# Patient Record
Sex: Male | Born: 1945
Health system: Southern US, Community
[De-identification: ages and names within clinical notes are randomized; demographics above are authoritative.]

## PROBLEM LIST (undated history)

## (undated) DIAGNOSIS — N289 Disorder of kidney and ureter, unspecified: Secondary | ICD-10-CM

## (undated) DIAGNOSIS — I10 Essential (primary) hypertension: Secondary | ICD-10-CM

## (undated) DIAGNOSIS — J449 Chronic obstructive pulmonary disease, unspecified: Secondary | ICD-10-CM

## (undated) DIAGNOSIS — I4891 Unspecified atrial fibrillation: Secondary | ICD-10-CM

## (undated) DIAGNOSIS — F419 Anxiety disorder, unspecified: Secondary | ICD-10-CM

## (undated) DIAGNOSIS — E119 Type 2 diabetes mellitus without complications: Secondary | ICD-10-CM

## (undated) DIAGNOSIS — I509 Heart failure, unspecified: Secondary | ICD-10-CM

## (undated) DIAGNOSIS — N189 Chronic kidney disease, unspecified: Secondary | ICD-10-CM

## (undated) HISTORY — DX: Type 2 diabetes mellitus without complications: E11.9

## (undated) HISTORY — DX: Chronic kidney disease, unspecified: N18.9

## (undated) HISTORY — DX: Unspecified atrial fibrillation: I48.91

## (undated) HISTORY — DX: Anxiety disorder, unspecified: F41.9

## (undated) HISTORY — DX: Disorder of kidney and ureter, unspecified: N28.9

---

## 2015-10-22 DIAGNOSIS — J440 Chronic obstructive pulmonary disease with acute lower respiratory infection: Secondary | ICD-10-CM | POA: Diagnosis not present

## 2015-10-22 DIAGNOSIS — Z0000 Encounter for general adult medical examination without abnormal findings: Secondary | ICD-10-CM | POA: Diagnosis not present

## 2015-10-22 DIAGNOSIS — J0101 Acute recurrent maxillary sinusitis: Secondary | ICD-10-CM | POA: Diagnosis not present

## 2015-10-22 DIAGNOSIS — Z1389 Encounter for screening for other disorder: Secondary | ICD-10-CM | POA: Diagnosis not present

## 2015-10-22 DIAGNOSIS — Z7901 Long term (current) use of anticoagulants: Secondary | ICD-10-CM | POA: Diagnosis not present

## 2015-10-29 DIAGNOSIS — K8020 Calculus of gallbladder without cholecystitis without obstruction: Secondary | ICD-10-CM | POA: Diagnosis not present

## 2015-11-10 DIAGNOSIS — I5030 Unspecified diastolic (congestive) heart failure: Secondary | ICD-10-CM | POA: Diagnosis not present

## 2015-11-10 DIAGNOSIS — J9610 Chronic respiratory failure, unspecified whether with hypoxia or hypercapnia: Secondary | ICD-10-CM | POA: Diagnosis not present

## 2015-11-10 DIAGNOSIS — Z7901 Long term (current) use of anticoagulants: Secondary | ICD-10-CM | POA: Diagnosis not present

## 2015-11-10 DIAGNOSIS — K8020 Calculus of gallbladder without cholecystitis without obstruction: Secondary | ICD-10-CM | POA: Diagnosis not present

## 2015-11-10 DIAGNOSIS — R1012 Left upper quadrant pain: Secondary | ICD-10-CM | POA: Diagnosis not present

## 2015-11-18 DIAGNOSIS — Z8249 Family history of ischemic heart disease and other diseases of the circulatory system: Secondary | ICD-10-CM | POA: Diagnosis not present

## 2015-11-18 DIAGNOSIS — I129 Hypertensive chronic kidney disease with stage 1 through stage 4 chronic kidney disease, or unspecified chronic kidney disease: Secondary | ICD-10-CM | POA: Diagnosis not present

## 2015-11-18 DIAGNOSIS — R1012 Left upper quadrant pain: Secondary | ICD-10-CM | POA: Diagnosis not present

## 2015-11-18 DIAGNOSIS — I509 Heart failure, unspecified: Secondary | ICD-10-CM | POA: Diagnosis not present

## 2015-11-18 DIAGNOSIS — J449 Chronic obstructive pulmonary disease, unspecified: Secondary | ICD-10-CM | POA: Diagnosis not present

## 2015-11-18 DIAGNOSIS — Z79899 Other long term (current) drug therapy: Secondary | ICD-10-CM | POA: Diagnosis not present

## 2015-11-18 DIAGNOSIS — I4891 Unspecified atrial fibrillation: Secondary | ICD-10-CM | POA: Diagnosis not present

## 2015-11-18 DIAGNOSIS — E1122 Type 2 diabetes mellitus with diabetic chronic kidney disease: Secondary | ICD-10-CM | POA: Diagnosis not present

## 2015-11-18 DIAGNOSIS — N183 Chronic kidney disease, stage 3 (moderate): Secondary | ICD-10-CM | POA: Diagnosis not present

## 2015-11-18 DIAGNOSIS — Z7901 Long term (current) use of anticoagulants: Secondary | ICD-10-CM | POA: Diagnosis not present

## 2015-11-18 DIAGNOSIS — Z833 Family history of diabetes mellitus: Secondary | ICD-10-CM | POA: Diagnosis not present

## 2015-11-18 DIAGNOSIS — R1011 Right upper quadrant pain: Secondary | ICD-10-CM | POA: Diagnosis not present

## 2015-11-18 DIAGNOSIS — I110 Hypertensive heart disease with heart failure: Secondary | ICD-10-CM | POA: Diagnosis not present

## 2015-11-18 DIAGNOSIS — K449 Diaphragmatic hernia without obstruction or gangrene: Secondary | ICD-10-CM | POA: Diagnosis not present

## 2015-11-18 DIAGNOSIS — K8020 Calculus of gallbladder without cholecystitis without obstruction: Secondary | ICD-10-CM | POA: Diagnosis not present

## 2015-11-25 DIAGNOSIS — J449 Chronic obstructive pulmonary disease, unspecified: Secondary | ICD-10-CM | POA: Diagnosis not present

## 2015-11-25 DIAGNOSIS — M1990 Unspecified osteoarthritis, unspecified site: Secondary | ICD-10-CM | POA: Diagnosis not present

## 2015-11-25 DIAGNOSIS — Z7984 Long term (current) use of oral hypoglycemic drugs: Secondary | ICD-10-CM | POA: Diagnosis not present

## 2015-11-25 DIAGNOSIS — Z79899 Other long term (current) drug therapy: Secondary | ICD-10-CM | POA: Diagnosis not present

## 2015-11-25 DIAGNOSIS — E119 Type 2 diabetes mellitus without complications: Secondary | ICD-10-CM | POA: Diagnosis not present

## 2015-11-25 DIAGNOSIS — Z7951 Long term (current) use of inhaled steroids: Secondary | ICD-10-CM | POA: Diagnosis not present

## 2015-11-25 DIAGNOSIS — Z792 Long term (current) use of antibiotics: Secondary | ICD-10-CM | POA: Diagnosis not present

## 2015-11-25 DIAGNOSIS — M8589 Other specified disorders of bone density and structure, multiple sites: Secondary | ICD-10-CM | POA: Diagnosis not present

## 2015-11-25 DIAGNOSIS — I10 Essential (primary) hypertension: Secondary | ICD-10-CM | POA: Diagnosis not present

## 2015-11-25 DIAGNOSIS — Z87891 Personal history of nicotine dependence: Secondary | ICD-10-CM | POA: Diagnosis not present

## 2015-11-25 DIAGNOSIS — M810 Age-related osteoporosis without current pathological fracture: Secondary | ICD-10-CM | POA: Diagnosis not present

## 2015-12-01 DIAGNOSIS — Z7901 Long term (current) use of anticoagulants: Secondary | ICD-10-CM | POA: Diagnosis not present

## 2015-12-11 DIAGNOSIS — J9610 Chronic respiratory failure, unspecified whether with hypoxia or hypercapnia: Secondary | ICD-10-CM | POA: Diagnosis not present

## 2015-12-11 DIAGNOSIS — I5030 Unspecified diastolic (congestive) heart failure: Secondary | ICD-10-CM | POA: Diagnosis not present

## 2015-12-16 DIAGNOSIS — I340 Nonrheumatic mitral (valve) insufficiency: Secondary | ICD-10-CM | POA: Diagnosis not present

## 2015-12-16 DIAGNOSIS — I5020 Unspecified systolic (congestive) heart failure: Secondary | ICD-10-CM | POA: Diagnosis not present

## 2015-12-16 DIAGNOSIS — R0602 Shortness of breath: Secondary | ICD-10-CM | POA: Diagnosis not present

## 2015-12-16 DIAGNOSIS — J449 Chronic obstructive pulmonary disease, unspecified: Secondary | ICD-10-CM | POA: Diagnosis not present

## 2015-12-16 DIAGNOSIS — I272 Other secondary pulmonary hypertension: Secondary | ICD-10-CM | POA: Diagnosis not present

## 2015-12-20 DIAGNOSIS — K8020 Calculus of gallbladder without cholecystitis without obstruction: Secondary | ICD-10-CM | POA: Diagnosis not present

## 2015-12-20 DIAGNOSIS — R1012 Left upper quadrant pain: Secondary | ICD-10-CM | POA: Diagnosis not present

## 2015-12-20 DIAGNOSIS — R109 Unspecified abdominal pain: Secondary | ICD-10-CM | POA: Diagnosis not present

## 2015-12-24 DIAGNOSIS — J439 Emphysema, unspecified: Secondary | ICD-10-CM | POA: Diagnosis not present

## 2015-12-24 DIAGNOSIS — N3289 Other specified disorders of bladder: Secondary | ICD-10-CM | POA: Diagnosis not present

## 2015-12-24 DIAGNOSIS — I714 Abdominal aortic aneurysm, without rupture: Secondary | ICD-10-CM | POA: Diagnosis not present

## 2015-12-24 DIAGNOSIS — K8020 Calculus of gallbladder without cholecystitis without obstruction: Secondary | ICD-10-CM | POA: Diagnosis not present

## 2015-12-24 DIAGNOSIS — R109 Unspecified abdominal pain: Secondary | ICD-10-CM | POA: Diagnosis not present

## 2015-12-29 DIAGNOSIS — Z7901 Long term (current) use of anticoagulants: Secondary | ICD-10-CM | POA: Diagnosis not present

## 2016-01-03 DIAGNOSIS — R1012 Left upper quadrant pain: Secondary | ICD-10-CM | POA: Diagnosis not present

## 2016-01-03 DIAGNOSIS — K8020 Calculus of gallbladder without cholecystitis without obstruction: Secondary | ICD-10-CM | POA: Diagnosis not present

## 2016-01-08 DIAGNOSIS — I5030 Unspecified diastolic (congestive) heart failure: Secondary | ICD-10-CM | POA: Diagnosis not present

## 2016-01-08 DIAGNOSIS — J9610 Chronic respiratory failure, unspecified whether with hypoxia or hypercapnia: Secondary | ICD-10-CM | POA: Diagnosis not present

## 2016-01-18 DIAGNOSIS — Z7901 Long term (current) use of anticoagulants: Secondary | ICD-10-CM | POA: Diagnosis not present

## 2016-01-19 DIAGNOSIS — Z79899 Other long term (current) drug therapy: Secondary | ICD-10-CM | POA: Diagnosis not present

## 2016-01-19 DIAGNOSIS — J449 Chronic obstructive pulmonary disease, unspecified: Secondary | ICD-10-CM | POA: Diagnosis not present

## 2016-01-19 DIAGNOSIS — Z5181 Encounter for therapeutic drug level monitoring: Secondary | ICD-10-CM | POA: Diagnosis not present

## 2016-01-20 DIAGNOSIS — Z0000 Encounter for general adult medical examination without abnormal findings: Secondary | ICD-10-CM | POA: Diagnosis not present

## 2016-01-20 DIAGNOSIS — J440 Chronic obstructive pulmonary disease with acute lower respiratory infection: Secondary | ICD-10-CM | POA: Diagnosis not present

## 2016-01-20 DIAGNOSIS — I480 Paroxysmal atrial fibrillation: Secondary | ICD-10-CM | POA: Diagnosis not present

## 2016-01-20 DIAGNOSIS — I5022 Chronic systolic (congestive) heart failure: Secondary | ICD-10-CM | POA: Diagnosis not present

## 2016-02-08 DIAGNOSIS — I5030 Unspecified diastolic (congestive) heart failure: Secondary | ICD-10-CM | POA: Diagnosis not present

## 2016-02-08 DIAGNOSIS — J9610 Chronic respiratory failure, unspecified whether with hypoxia or hypercapnia: Secondary | ICD-10-CM | POA: Diagnosis not present

## 2016-02-16 DIAGNOSIS — Z7901 Long term (current) use of anticoagulants: Secondary | ICD-10-CM | POA: Diagnosis not present

## 2016-02-17 DIAGNOSIS — J988 Other specified respiratory disorders: Secondary | ICD-10-CM | POA: Diagnosis not present

## 2016-02-17 DIAGNOSIS — Z79899 Other long term (current) drug therapy: Secondary | ICD-10-CM | POA: Diagnosis not present

## 2016-03-09 DIAGNOSIS — J9610 Chronic respiratory failure, unspecified whether with hypoxia or hypercapnia: Secondary | ICD-10-CM | POA: Diagnosis not present

## 2016-03-09 DIAGNOSIS — I5030 Unspecified diastolic (congestive) heart failure: Secondary | ICD-10-CM | POA: Diagnosis not present

## 2016-03-15 DIAGNOSIS — I480 Paroxysmal atrial fibrillation: Secondary | ICD-10-CM | POA: Diagnosis not present

## 2016-04-05 DIAGNOSIS — I480 Paroxysmal atrial fibrillation: Secondary | ICD-10-CM | POA: Diagnosis not present

## 2016-04-09 DIAGNOSIS — I5030 Unspecified diastolic (congestive) heart failure: Secondary | ICD-10-CM | POA: Diagnosis not present

## 2016-04-09 DIAGNOSIS — J9610 Chronic respiratory failure, unspecified whether with hypoxia or hypercapnia: Secondary | ICD-10-CM | POA: Diagnosis not present

## 2016-04-24 DIAGNOSIS — E1122 Type 2 diabetes mellitus with diabetic chronic kidney disease: Secondary | ICD-10-CM | POA: Diagnosis not present

## 2016-04-24 DIAGNOSIS — I5022 Chronic systolic (congestive) heart failure: Secondary | ICD-10-CM | POA: Diagnosis not present

## 2016-04-24 DIAGNOSIS — N183 Chronic kidney disease, stage 3 (moderate): Secondary | ICD-10-CM | POA: Diagnosis not present

## 2016-04-24 DIAGNOSIS — I480 Paroxysmal atrial fibrillation: Secondary | ICD-10-CM | POA: Diagnosis not present

## 2016-04-24 DIAGNOSIS — J440 Chronic obstructive pulmonary disease with acute lower respiratory infection: Secondary | ICD-10-CM | POA: Diagnosis not present

## 2016-04-26 DIAGNOSIS — N183 Chronic kidney disease, stage 3 (moderate): Secondary | ICD-10-CM | POA: Diagnosis not present

## 2016-04-26 DIAGNOSIS — E1122 Type 2 diabetes mellitus with diabetic chronic kidney disease: Secondary | ICD-10-CM | POA: Diagnosis not present

## 2016-04-26 DIAGNOSIS — I480 Paroxysmal atrial fibrillation: Secondary | ICD-10-CM | POA: Diagnosis not present

## 2016-05-09 DIAGNOSIS — I5030 Unspecified diastolic (congestive) heart failure: Secondary | ICD-10-CM | POA: Diagnosis not present

## 2016-05-09 DIAGNOSIS — J9610 Chronic respiratory failure, unspecified whether with hypoxia or hypercapnia: Secondary | ICD-10-CM | POA: Diagnosis not present

## 2016-05-10 DIAGNOSIS — I480 Paroxysmal atrial fibrillation: Secondary | ICD-10-CM | POA: Diagnosis not present

## 2016-05-16 DIAGNOSIS — J441 Chronic obstructive pulmonary disease with (acute) exacerbation: Secondary | ICD-10-CM | POA: Diagnosis not present

## 2016-05-16 DIAGNOSIS — E1122 Type 2 diabetes mellitus with diabetic chronic kidney disease: Secondary | ICD-10-CM | POA: Diagnosis not present

## 2016-05-16 DIAGNOSIS — I5022 Chronic systolic (congestive) heart failure: Secondary | ICD-10-CM | POA: Diagnosis not present

## 2016-05-16 DIAGNOSIS — N183 Chronic kidney disease, stage 3 (moderate): Secondary | ICD-10-CM | POA: Diagnosis not present

## 2016-05-16 DIAGNOSIS — I480 Paroxysmal atrial fibrillation: Secondary | ICD-10-CM | POA: Diagnosis not present

## 2016-05-24 DIAGNOSIS — I480 Paroxysmal atrial fibrillation: Secondary | ICD-10-CM | POA: Diagnosis not present

## 2016-06-09 DIAGNOSIS — I5030 Unspecified diastolic (congestive) heart failure: Secondary | ICD-10-CM | POA: Diagnosis not present

## 2016-06-09 DIAGNOSIS — J9610 Chronic respiratory failure, unspecified whether with hypoxia or hypercapnia: Secondary | ICD-10-CM | POA: Diagnosis not present

## 2016-06-21 DIAGNOSIS — I480 Paroxysmal atrial fibrillation: Secondary | ICD-10-CM | POA: Diagnosis not present

## 2016-06-22 DIAGNOSIS — I5022 Chronic systolic (congestive) heart failure: Secondary | ICD-10-CM | POA: Diagnosis not present

## 2016-06-22 DIAGNOSIS — I480 Paroxysmal atrial fibrillation: Secondary | ICD-10-CM | POA: Diagnosis not present

## 2016-06-22 DIAGNOSIS — E1122 Type 2 diabetes mellitus with diabetic chronic kidney disease: Secondary | ICD-10-CM | POA: Diagnosis not present

## 2016-06-22 DIAGNOSIS — J441 Chronic obstructive pulmonary disease with (acute) exacerbation: Secondary | ICD-10-CM | POA: Diagnosis not present

## 2016-06-22 DIAGNOSIS — N183 Chronic kidney disease, stage 3 (moderate): Secondary | ICD-10-CM | POA: Diagnosis not present

## 2016-07-05 DIAGNOSIS — I480 Paroxysmal atrial fibrillation: Secondary | ICD-10-CM | POA: Diagnosis not present

## 2016-07-10 DIAGNOSIS — I5030 Unspecified diastolic (congestive) heart failure: Secondary | ICD-10-CM | POA: Diagnosis not present

## 2016-07-10 DIAGNOSIS — J9610 Chronic respiratory failure, unspecified whether with hypoxia or hypercapnia: Secondary | ICD-10-CM | POA: Diagnosis not present

## 2016-07-19 DIAGNOSIS — I480 Paroxysmal atrial fibrillation: Secondary | ICD-10-CM | POA: Diagnosis not present

## 2016-07-25 DIAGNOSIS — J440 Chronic obstructive pulmonary disease with acute lower respiratory infection: Secondary | ICD-10-CM | POA: Diagnosis not present

## 2016-07-25 DIAGNOSIS — I5022 Chronic systolic (congestive) heart failure: Secondary | ICD-10-CM | POA: Diagnosis not present

## 2016-07-25 DIAGNOSIS — N183 Chronic kidney disease, stage 3 (moderate): Secondary | ICD-10-CM | POA: Diagnosis not present

## 2016-07-25 DIAGNOSIS — E1122 Type 2 diabetes mellitus with diabetic chronic kidney disease: Secondary | ICD-10-CM | POA: Diagnosis not present

## 2016-08-02 DIAGNOSIS — N183 Chronic kidney disease, stage 3 (moderate): Secondary | ICD-10-CM | POA: Diagnosis not present

## 2016-08-02 DIAGNOSIS — Z7901 Long term (current) use of anticoagulants: Secondary | ICD-10-CM | POA: Diagnosis not present

## 2016-08-02 DIAGNOSIS — E1122 Type 2 diabetes mellitus with diabetic chronic kidney disease: Secondary | ICD-10-CM | POA: Diagnosis not present

## 2016-08-09 DIAGNOSIS — J9610 Chronic respiratory failure, unspecified whether with hypoxia or hypercapnia: Secondary | ICD-10-CM | POA: Diagnosis not present

## 2016-08-09 DIAGNOSIS — I5030 Unspecified diastolic (congestive) heart failure: Secondary | ICD-10-CM | POA: Diagnosis not present

## 2016-08-10 DIAGNOSIS — J449 Chronic obstructive pulmonary disease, unspecified: Secondary | ICD-10-CM | POA: Diagnosis not present

## 2016-08-18 DIAGNOSIS — E1122 Type 2 diabetes mellitus with diabetic chronic kidney disease: Secondary | ICD-10-CM | POA: Diagnosis not present

## 2016-08-18 DIAGNOSIS — J440 Chronic obstructive pulmonary disease with acute lower respiratory infection: Secondary | ICD-10-CM | POA: Diagnosis not present

## 2016-08-18 DIAGNOSIS — I5022 Chronic systolic (congestive) heart failure: Secondary | ICD-10-CM | POA: Diagnosis not present

## 2016-08-18 DIAGNOSIS — N183 Chronic kidney disease, stage 3 (moderate): Secondary | ICD-10-CM | POA: Diagnosis not present

## 2016-08-23 DIAGNOSIS — Z7901 Long term (current) use of anticoagulants: Secondary | ICD-10-CM | POA: Diagnosis not present

## 2016-09-05 DIAGNOSIS — J449 Chronic obstructive pulmonary disease, unspecified: Secondary | ICD-10-CM | POA: Diagnosis not present

## 2016-09-09 DIAGNOSIS — I5030 Unspecified diastolic (congestive) heart failure: Secondary | ICD-10-CM | POA: Diagnosis not present

## 2016-09-09 DIAGNOSIS — J9610 Chronic respiratory failure, unspecified whether with hypoxia or hypercapnia: Secondary | ICD-10-CM | POA: Diagnosis not present

## 2016-09-10 DIAGNOSIS — J449 Chronic obstructive pulmonary disease, unspecified: Secondary | ICD-10-CM | POA: Diagnosis not present

## 2016-09-13 DIAGNOSIS — Z7901 Long term (current) use of anticoagulants: Secondary | ICD-10-CM | POA: Diagnosis not present

## 2016-09-13 DIAGNOSIS — M79672 Pain in left foot: Secondary | ICD-10-CM | POA: Diagnosis not present

## 2016-09-13 DIAGNOSIS — I130 Hypertensive heart and chronic kidney disease with heart failure and stage 1 through stage 4 chronic kidney disease, or unspecified chronic kidney disease: Secondary | ICD-10-CM | POA: Diagnosis not present

## 2016-09-13 DIAGNOSIS — Z7984 Long term (current) use of oral hypoglycemic drugs: Secondary | ICD-10-CM | POA: Diagnosis not present

## 2016-09-13 DIAGNOSIS — E1122 Type 2 diabetes mellitus with diabetic chronic kidney disease: Secondary | ICD-10-CM | POA: Diagnosis not present

## 2016-09-13 DIAGNOSIS — I509 Heart failure, unspecified: Secondary | ICD-10-CM | POA: Diagnosis not present

## 2016-09-13 DIAGNOSIS — Z79899 Other long term (current) drug therapy: Secondary | ICD-10-CM | POA: Diagnosis not present

## 2016-09-13 DIAGNOSIS — N183 Chronic kidney disease, stage 3 (moderate): Secondary | ICD-10-CM | POA: Diagnosis not present

## 2016-09-13 DIAGNOSIS — S9032XA Contusion of left foot, initial encounter: Secondary | ICD-10-CM | POA: Diagnosis not present

## 2016-09-13 DIAGNOSIS — W228XXA Striking against or struck by other objects, initial encounter: Secondary | ICD-10-CM | POA: Diagnosis not present

## 2016-09-13 DIAGNOSIS — Z87891 Personal history of nicotine dependence: Secondary | ICD-10-CM | POA: Diagnosis not present

## 2016-09-18 DIAGNOSIS — N183 Chronic kidney disease, stage 3 (moderate): Secondary | ICD-10-CM | POA: Diagnosis not present

## 2016-09-18 DIAGNOSIS — I5022 Chronic systolic (congestive) heart failure: Secondary | ICD-10-CM | POA: Diagnosis not present

## 2016-09-18 DIAGNOSIS — J440 Chronic obstructive pulmonary disease with acute lower respiratory infection: Secondary | ICD-10-CM | POA: Diagnosis not present

## 2016-09-18 DIAGNOSIS — E1122 Type 2 diabetes mellitus with diabetic chronic kidney disease: Secondary | ICD-10-CM | POA: Diagnosis not present

## 2016-09-20 DIAGNOSIS — Z7901 Long term (current) use of anticoagulants: Secondary | ICD-10-CM | POA: Diagnosis not present

## 2016-09-21 DIAGNOSIS — J449 Chronic obstructive pulmonary disease, unspecified: Secondary | ICD-10-CM | POA: Diagnosis not present

## 2016-09-21 DIAGNOSIS — Z79899 Other long term (current) drug therapy: Secondary | ICD-10-CM | POA: Diagnosis not present

## 2016-09-21 DIAGNOSIS — I4891 Unspecified atrial fibrillation: Secondary | ICD-10-CM | POA: Diagnosis not present

## 2016-09-21 DIAGNOSIS — I700 Atherosclerosis of aorta: Secondary | ICD-10-CM | POA: Diagnosis not present

## 2016-10-04 DIAGNOSIS — Z7901 Long term (current) use of anticoagulants: Secondary | ICD-10-CM | POA: Diagnosis not present

## 2016-10-10 DIAGNOSIS — J449 Chronic obstructive pulmonary disease, unspecified: Secondary | ICD-10-CM | POA: Diagnosis not present

## 2016-10-11 DIAGNOSIS — J449 Chronic obstructive pulmonary disease, unspecified: Secondary | ICD-10-CM | POA: Diagnosis not present

## 2016-10-18 DIAGNOSIS — Z7901 Long term (current) use of anticoagulants: Secondary | ICD-10-CM | POA: Diagnosis not present

## 2016-10-19 DIAGNOSIS — E1122 Type 2 diabetes mellitus with diabetic chronic kidney disease: Secondary | ICD-10-CM | POA: Diagnosis not present

## 2016-10-19 DIAGNOSIS — I5022 Chronic systolic (congestive) heart failure: Secondary | ICD-10-CM | POA: Diagnosis not present

## 2016-10-19 DIAGNOSIS — N183 Chronic kidney disease, stage 3 (moderate): Secondary | ICD-10-CM | POA: Diagnosis not present

## 2016-10-19 DIAGNOSIS — J440 Chronic obstructive pulmonary disease with acute lower respiratory infection: Secondary | ICD-10-CM | POA: Diagnosis not present

## 2016-10-26 DIAGNOSIS — N183 Chronic kidney disease, stage 3 (moderate): Secondary | ICD-10-CM | POA: Diagnosis not present

## 2016-10-26 DIAGNOSIS — J441 Chronic obstructive pulmonary disease with (acute) exacerbation: Secondary | ICD-10-CM | POA: Diagnosis not present

## 2016-10-26 DIAGNOSIS — Z1389 Encounter for screening for other disorder: Secondary | ICD-10-CM | POA: Diagnosis not present

## 2016-10-26 DIAGNOSIS — J440 Chronic obstructive pulmonary disease with acute lower respiratory infection: Secondary | ICD-10-CM | POA: Diagnosis not present

## 2016-10-26 DIAGNOSIS — E1122 Type 2 diabetes mellitus with diabetic chronic kidney disease: Secondary | ICD-10-CM | POA: Diagnosis not present

## 2016-10-26 DIAGNOSIS — Z681 Body mass index (BMI) 19 or less, adult: Secondary | ICD-10-CM | POA: Diagnosis not present

## 2016-10-26 DIAGNOSIS — Z0000 Encounter for general adult medical examination without abnormal findings: Secondary | ICD-10-CM | POA: Diagnosis not present

## 2016-10-26 DIAGNOSIS — I5022 Chronic systolic (congestive) heart failure: Secondary | ICD-10-CM | POA: Diagnosis not present

## 2016-10-26 DIAGNOSIS — Z125 Encounter for screening for malignant neoplasm of prostate: Secondary | ICD-10-CM | POA: Diagnosis not present

## 2016-10-26 DIAGNOSIS — Z79899 Other long term (current) drug therapy: Secondary | ICD-10-CM | POA: Diagnosis not present

## 2016-10-26 DIAGNOSIS — R630 Anorexia: Secondary | ICD-10-CM | POA: Diagnosis not present

## 2016-10-29 DIAGNOSIS — J9600 Acute respiratory failure, unspecified whether with hypoxia or hypercapnia: Secondary | ICD-10-CM | POA: Diagnosis not present

## 2016-10-29 DIAGNOSIS — I5042 Chronic combined systolic (congestive) and diastolic (congestive) heart failure: Secondary | ICD-10-CM | POA: Diagnosis not present

## 2016-10-29 DIAGNOSIS — I5022 Chronic systolic (congestive) heart failure: Secondary | ICD-10-CM | POA: Diagnosis not present

## 2016-10-29 DIAGNOSIS — N183 Chronic kidney disease, stage 3 (moderate): Secondary | ICD-10-CM | POA: Diagnosis not present

## 2016-10-29 DIAGNOSIS — R0603 Acute respiratory distress: Secondary | ICD-10-CM | POA: Diagnosis not present

## 2016-10-29 DIAGNOSIS — E1122 Type 2 diabetes mellitus with diabetic chronic kidney disease: Secondary | ICD-10-CM | POA: Diagnosis not present

## 2016-10-29 DIAGNOSIS — R0602 Shortness of breath: Secondary | ICD-10-CM | POA: Diagnosis not present

## 2016-10-29 DIAGNOSIS — Z7722 Contact with and (suspected) exposure to environmental tobacco smoke (acute) (chronic): Secondary | ICD-10-CM | POA: Diagnosis not present

## 2016-10-29 DIAGNOSIS — I130 Hypertensive heart and chronic kidney disease with heart failure and stage 1 through stage 4 chronic kidney disease, or unspecified chronic kidney disease: Secondary | ICD-10-CM | POA: Diagnosis not present

## 2016-10-29 DIAGNOSIS — E440 Moderate protein-calorie malnutrition: Secondary | ICD-10-CM | POA: Diagnosis not present

## 2016-10-29 DIAGNOSIS — Z681 Body mass index (BMI) 19 or less, adult: Secondary | ICD-10-CM | POA: Diagnosis not present

## 2016-10-29 DIAGNOSIS — Z7901 Long term (current) use of anticoagulants: Secondary | ICD-10-CM | POA: Diagnosis not present

## 2016-10-29 DIAGNOSIS — I509 Heart failure, unspecified: Secondary | ICD-10-CM | POA: Diagnosis not present

## 2016-10-29 DIAGNOSIS — N189 Chronic kidney disease, unspecified: Secondary | ICD-10-CM | POA: Diagnosis not present

## 2016-10-30 DIAGNOSIS — E1122 Type 2 diabetes mellitus with diabetic chronic kidney disease: Secondary | ICD-10-CM | POA: Diagnosis not present

## 2016-10-30 DIAGNOSIS — E440 Moderate protein-calorie malnutrition: Secondary | ICD-10-CM | POA: Diagnosis not present

## 2016-10-30 DIAGNOSIS — I5022 Chronic systolic (congestive) heart failure: Secondary | ICD-10-CM | POA: Diagnosis not present

## 2016-10-30 DIAGNOSIS — N183 Chronic kidney disease, stage 3 (moderate): Secondary | ICD-10-CM | POA: Diagnosis not present

## 2016-11-01 DIAGNOSIS — I5022 Chronic systolic (congestive) heart failure: Secondary | ICD-10-CM | POA: Diagnosis not present

## 2016-11-01 DIAGNOSIS — E440 Moderate protein-calorie malnutrition: Secondary | ICD-10-CM | POA: Diagnosis not present

## 2016-11-01 DIAGNOSIS — N183 Chronic kidney disease, stage 3 (moderate): Secondary | ICD-10-CM | POA: Diagnosis not present

## 2016-11-01 DIAGNOSIS — E1122 Type 2 diabetes mellitus with diabetic chronic kidney disease: Secondary | ICD-10-CM | POA: Diagnosis not present

## 2016-11-06 DIAGNOSIS — J441 Chronic obstructive pulmonary disease with (acute) exacerbation: Secondary | ICD-10-CM | POA: Diagnosis not present

## 2016-11-06 DIAGNOSIS — J9610 Chronic respiratory failure, unspecified whether with hypoxia or hypercapnia: Secondary | ICD-10-CM | POA: Diagnosis not present

## 2016-11-06 DIAGNOSIS — J449 Chronic obstructive pulmonary disease, unspecified: Secondary | ICD-10-CM | POA: Diagnosis not present

## 2016-11-06 DIAGNOSIS — I5030 Unspecified diastolic (congestive) heart failure: Secondary | ICD-10-CM | POA: Diagnosis not present

## 2016-11-06 DIAGNOSIS — I4891 Unspecified atrial fibrillation: Secondary | ICD-10-CM | POA: Diagnosis not present

## 2016-11-09 DIAGNOSIS — R630 Anorexia: Secondary | ICD-10-CM | POA: Diagnosis not present

## 2016-11-09 DIAGNOSIS — J441 Chronic obstructive pulmonary disease with (acute) exacerbation: Secondary | ICD-10-CM | POA: Diagnosis not present

## 2016-11-09 DIAGNOSIS — Z681 Body mass index (BMI) 19 or less, adult: Secondary | ICD-10-CM | POA: Diagnosis not present

## 2016-11-22 DIAGNOSIS — Z7901 Long term (current) use of anticoagulants: Secondary | ICD-10-CM | POA: Diagnosis not present

## 2016-12-06 DIAGNOSIS — Z7901 Long term (current) use of anticoagulants: Secondary | ICD-10-CM | POA: Diagnosis not present

## 2016-12-07 DIAGNOSIS — J449 Chronic obstructive pulmonary disease, unspecified: Secondary | ICD-10-CM | POA: Diagnosis not present

## 2016-12-07 DIAGNOSIS — I4891 Unspecified atrial fibrillation: Secondary | ICD-10-CM | POA: Diagnosis not present

## 2016-12-07 DIAGNOSIS — J9610 Chronic respiratory failure, unspecified whether with hypoxia or hypercapnia: Secondary | ICD-10-CM | POA: Diagnosis not present

## 2016-12-07 DIAGNOSIS — J441 Chronic obstructive pulmonary disease with (acute) exacerbation: Secondary | ICD-10-CM | POA: Diagnosis not present

## 2016-12-07 DIAGNOSIS — I5030 Unspecified diastolic (congestive) heart failure: Secondary | ICD-10-CM | POA: Diagnosis not present

## 2016-12-27 DIAGNOSIS — Z7901 Long term (current) use of anticoagulants: Secondary | ICD-10-CM | POA: Diagnosis not present

## 2017-01-04 DIAGNOSIS — I4891 Unspecified atrial fibrillation: Secondary | ICD-10-CM | POA: Diagnosis not present

## 2017-01-04 DIAGNOSIS — J9610 Chronic respiratory failure, unspecified whether with hypoxia or hypercapnia: Secondary | ICD-10-CM | POA: Diagnosis not present

## 2017-01-04 DIAGNOSIS — J449 Chronic obstructive pulmonary disease, unspecified: Secondary | ICD-10-CM | POA: Diagnosis not present

## 2017-01-04 DIAGNOSIS — I5030 Unspecified diastolic (congestive) heart failure: Secondary | ICD-10-CM | POA: Diagnosis not present

## 2017-01-04 DIAGNOSIS — J441 Chronic obstructive pulmonary disease with (acute) exacerbation: Secondary | ICD-10-CM | POA: Diagnosis not present

## 2017-01-17 DIAGNOSIS — Z7901 Long term (current) use of anticoagulants: Secondary | ICD-10-CM | POA: Diagnosis not present

## 2017-01-23 DIAGNOSIS — M818 Other osteoporosis without current pathological fracture: Secondary | ICD-10-CM | POA: Diagnosis not present

## 2017-01-23 DIAGNOSIS — I5022 Chronic systolic (congestive) heart failure: Secondary | ICD-10-CM | POA: Diagnosis not present

## 2017-01-23 DIAGNOSIS — Z681 Body mass index (BMI) 19 or less, adult: Secondary | ICD-10-CM | POA: Diagnosis not present

## 2017-01-23 DIAGNOSIS — M542 Cervicalgia: Secondary | ICD-10-CM | POA: Diagnosis not present

## 2017-01-23 DIAGNOSIS — J441 Chronic obstructive pulmonary disease with (acute) exacerbation: Secondary | ICD-10-CM | POA: Diagnosis not present

## 2017-01-23 DIAGNOSIS — E1122 Type 2 diabetes mellitus with diabetic chronic kidney disease: Secondary | ICD-10-CM | POA: Diagnosis not present

## 2017-01-23 DIAGNOSIS — I482 Chronic atrial fibrillation: Secondary | ICD-10-CM | POA: Diagnosis not present

## 2017-01-31 DIAGNOSIS — I482 Chronic atrial fibrillation: Secondary | ICD-10-CM | POA: Diagnosis not present

## 2017-02-04 DIAGNOSIS — J441 Chronic obstructive pulmonary disease with (acute) exacerbation: Secondary | ICD-10-CM | POA: Diagnosis not present

## 2017-02-04 DIAGNOSIS — J9610 Chronic respiratory failure, unspecified whether with hypoxia or hypercapnia: Secondary | ICD-10-CM | POA: Diagnosis not present

## 2017-02-04 DIAGNOSIS — J449 Chronic obstructive pulmonary disease, unspecified: Secondary | ICD-10-CM | POA: Diagnosis not present

## 2017-02-04 DIAGNOSIS — I5030 Unspecified diastolic (congestive) heart failure: Secondary | ICD-10-CM | POA: Diagnosis not present

## 2017-02-04 DIAGNOSIS — I4891 Unspecified atrial fibrillation: Secondary | ICD-10-CM | POA: Diagnosis not present

## 2017-02-14 DIAGNOSIS — I482 Chronic atrial fibrillation: Secondary | ICD-10-CM | POA: Diagnosis not present

## 2017-02-20 DIAGNOSIS — E109 Type 1 diabetes mellitus without complications: Secondary | ICD-10-CM | POA: Diagnosis not present

## 2017-02-20 DIAGNOSIS — Z0100 Encounter for examination of eyes and vision without abnormal findings: Secondary | ICD-10-CM | POA: Diagnosis not present

## 2017-02-20 DIAGNOSIS — I10 Essential (primary) hypertension: Secondary | ICD-10-CM | POA: Diagnosis not present

## 2017-02-20 DIAGNOSIS — H25813 Combined forms of age-related cataract, bilateral: Secondary | ICD-10-CM | POA: Diagnosis not present

## 2017-02-28 DIAGNOSIS — I482 Chronic atrial fibrillation: Secondary | ICD-10-CM | POA: Diagnosis not present

## 2017-03-06 DIAGNOSIS — J449 Chronic obstructive pulmonary disease, unspecified: Secondary | ICD-10-CM | POA: Diagnosis not present

## 2017-03-06 DIAGNOSIS — J441 Chronic obstructive pulmonary disease with (acute) exacerbation: Secondary | ICD-10-CM | POA: Diagnosis not present

## 2017-03-06 DIAGNOSIS — J9610 Chronic respiratory failure, unspecified whether with hypoxia or hypercapnia: Secondary | ICD-10-CM | POA: Diagnosis not present

## 2017-03-06 DIAGNOSIS — I4891 Unspecified atrial fibrillation: Secondary | ICD-10-CM | POA: Diagnosis not present

## 2017-03-06 DIAGNOSIS — I5030 Unspecified diastolic (congestive) heart failure: Secondary | ICD-10-CM | POA: Diagnosis not present

## 2017-03-07 DIAGNOSIS — I480 Paroxysmal atrial fibrillation: Secondary | ICD-10-CM | POA: Diagnosis not present

## 2017-03-07 DIAGNOSIS — E1122 Type 2 diabetes mellitus with diabetic chronic kidney disease: Secondary | ICD-10-CM | POA: Diagnosis not present

## 2017-03-07 DIAGNOSIS — I10 Essential (primary) hypertension: Secondary | ICD-10-CM | POA: Diagnosis not present

## 2017-03-07 DIAGNOSIS — I5022 Chronic systolic (congestive) heart failure: Secondary | ICD-10-CM | POA: Diagnosis not present

## 2017-03-07 DIAGNOSIS — J441 Chronic obstructive pulmonary disease with (acute) exacerbation: Secondary | ICD-10-CM | POA: Diagnosis not present

## 2017-03-21 DIAGNOSIS — I482 Chronic atrial fibrillation: Secondary | ICD-10-CM | POA: Diagnosis not present

## 2017-04-06 DIAGNOSIS — J9610 Chronic respiratory failure, unspecified whether with hypoxia or hypercapnia: Secondary | ICD-10-CM | POA: Diagnosis not present

## 2017-04-06 DIAGNOSIS — I4891 Unspecified atrial fibrillation: Secondary | ICD-10-CM | POA: Diagnosis not present

## 2017-04-06 DIAGNOSIS — J441 Chronic obstructive pulmonary disease with (acute) exacerbation: Secondary | ICD-10-CM | POA: Diagnosis not present

## 2017-04-06 DIAGNOSIS — I5030 Unspecified diastolic (congestive) heart failure: Secondary | ICD-10-CM | POA: Diagnosis not present

## 2017-04-06 DIAGNOSIS — J449 Chronic obstructive pulmonary disease, unspecified: Secondary | ICD-10-CM | POA: Diagnosis not present

## 2017-04-11 DIAGNOSIS — I482 Chronic atrial fibrillation: Secondary | ICD-10-CM | POA: Diagnosis not present

## 2017-04-19 DIAGNOSIS — Z125 Encounter for screening for malignant neoplasm of prostate: Secondary | ICD-10-CM | POA: Diagnosis not present

## 2017-04-19 DIAGNOSIS — E1122 Type 2 diabetes mellitus with diabetic chronic kidney disease: Secondary | ICD-10-CM | POA: Diagnosis not present

## 2017-04-19 DIAGNOSIS — M818 Other osteoporosis without current pathological fracture: Secondary | ICD-10-CM | POA: Diagnosis not present

## 2017-04-19 DIAGNOSIS — M542 Cervicalgia: Secondary | ICD-10-CM | POA: Diagnosis not present

## 2017-04-19 DIAGNOSIS — J441 Chronic obstructive pulmonary disease with (acute) exacerbation: Secondary | ICD-10-CM | POA: Diagnosis not present

## 2017-04-19 DIAGNOSIS — Z79899 Other long term (current) drug therapy: Secondary | ICD-10-CM | POA: Diagnosis not present

## 2017-04-19 DIAGNOSIS — Z681 Body mass index (BMI) 19 or less, adult: Secondary | ICD-10-CM | POA: Diagnosis not present

## 2017-04-19 DIAGNOSIS — I5022 Chronic systolic (congestive) heart failure: Secondary | ICD-10-CM | POA: Diagnosis not present

## 2017-04-19 DIAGNOSIS — I482 Chronic atrial fibrillation: Secondary | ICD-10-CM | POA: Diagnosis not present

## 2017-05-06 DIAGNOSIS — I4891 Unspecified atrial fibrillation: Secondary | ICD-10-CM | POA: Diagnosis not present

## 2017-05-06 DIAGNOSIS — J9610 Chronic respiratory failure, unspecified whether with hypoxia or hypercapnia: Secondary | ICD-10-CM | POA: Diagnosis not present

## 2017-05-06 DIAGNOSIS — I5030 Unspecified diastolic (congestive) heart failure: Secondary | ICD-10-CM | POA: Diagnosis not present

## 2017-05-06 DIAGNOSIS — J449 Chronic obstructive pulmonary disease, unspecified: Secondary | ICD-10-CM | POA: Diagnosis not present

## 2017-05-06 DIAGNOSIS — J441 Chronic obstructive pulmonary disease with (acute) exacerbation: Secondary | ICD-10-CM | POA: Diagnosis not present

## 2017-05-16 DIAGNOSIS — I482 Chronic atrial fibrillation: Secondary | ICD-10-CM | POA: Diagnosis not present

## 2017-06-06 DIAGNOSIS — I4891 Unspecified atrial fibrillation: Secondary | ICD-10-CM | POA: Diagnosis not present

## 2017-06-06 DIAGNOSIS — I5030 Unspecified diastolic (congestive) heart failure: Secondary | ICD-10-CM | POA: Diagnosis not present

## 2017-06-06 DIAGNOSIS — I482 Chronic atrial fibrillation: Secondary | ICD-10-CM | POA: Diagnosis not present

## 2017-06-06 DIAGNOSIS — J9610 Chronic respiratory failure, unspecified whether with hypoxia or hypercapnia: Secondary | ICD-10-CM | POA: Diagnosis not present

## 2017-06-06 DIAGNOSIS — J449 Chronic obstructive pulmonary disease, unspecified: Secondary | ICD-10-CM | POA: Diagnosis not present

## 2017-06-06 DIAGNOSIS — J441 Chronic obstructive pulmonary disease with (acute) exacerbation: Secondary | ICD-10-CM | POA: Diagnosis not present

## 2017-07-04 DIAGNOSIS — I482 Chronic atrial fibrillation: Secondary | ICD-10-CM | POA: Diagnosis not present

## 2017-07-07 DIAGNOSIS — I4891 Unspecified atrial fibrillation: Secondary | ICD-10-CM | POA: Diagnosis not present

## 2017-07-07 DIAGNOSIS — J441 Chronic obstructive pulmonary disease with (acute) exacerbation: Secondary | ICD-10-CM | POA: Diagnosis not present

## 2017-07-07 DIAGNOSIS — I5030 Unspecified diastolic (congestive) heart failure: Secondary | ICD-10-CM | POA: Diagnosis not present

## 2017-07-07 DIAGNOSIS — J449 Chronic obstructive pulmonary disease, unspecified: Secondary | ICD-10-CM | POA: Diagnosis not present

## 2017-07-07 DIAGNOSIS — J9610 Chronic respiratory failure, unspecified whether with hypoxia or hypercapnia: Secondary | ICD-10-CM | POA: Diagnosis not present

## 2017-07-24 DIAGNOSIS — J441 Chronic obstructive pulmonary disease with (acute) exacerbation: Secondary | ICD-10-CM | POA: Diagnosis not present

## 2017-07-24 DIAGNOSIS — Z681 Body mass index (BMI) 19 or less, adult: Secondary | ICD-10-CM | POA: Diagnosis not present

## 2017-07-24 DIAGNOSIS — M818 Other osteoporosis without current pathological fracture: Secondary | ICD-10-CM | POA: Diagnosis not present

## 2017-07-24 DIAGNOSIS — M542 Cervicalgia: Secondary | ICD-10-CM | POA: Diagnosis not present

## 2017-07-24 DIAGNOSIS — I482 Chronic atrial fibrillation: Secondary | ICD-10-CM | POA: Diagnosis not present

## 2017-07-24 DIAGNOSIS — E1122 Type 2 diabetes mellitus with diabetic chronic kidney disease: Secondary | ICD-10-CM | POA: Diagnosis not present

## 2017-07-24 DIAGNOSIS — I5022 Chronic systolic (congestive) heart failure: Secondary | ICD-10-CM | POA: Diagnosis not present

## 2017-08-06 DIAGNOSIS — J9610 Chronic respiratory failure, unspecified whether with hypoxia or hypercapnia: Secondary | ICD-10-CM | POA: Diagnosis not present

## 2017-08-06 DIAGNOSIS — J441 Chronic obstructive pulmonary disease with (acute) exacerbation: Secondary | ICD-10-CM | POA: Diagnosis not present

## 2017-08-06 DIAGNOSIS — I5030 Unspecified diastolic (congestive) heart failure: Secondary | ICD-10-CM | POA: Diagnosis not present

## 2017-08-06 DIAGNOSIS — J449 Chronic obstructive pulmonary disease, unspecified: Secondary | ICD-10-CM | POA: Diagnosis not present

## 2017-08-06 DIAGNOSIS — I4891 Unspecified atrial fibrillation: Secondary | ICD-10-CM | POA: Diagnosis not present

## 2017-08-08 DIAGNOSIS — I482 Chronic atrial fibrillation: Secondary | ICD-10-CM | POA: Diagnosis not present

## 2017-08-29 DIAGNOSIS — I482 Chronic atrial fibrillation: Secondary | ICD-10-CM | POA: Diagnosis not present

## 2017-09-06 DIAGNOSIS — I4891 Unspecified atrial fibrillation: Secondary | ICD-10-CM | POA: Diagnosis not present

## 2017-09-06 DIAGNOSIS — J9610 Chronic respiratory failure, unspecified whether with hypoxia or hypercapnia: Secondary | ICD-10-CM | POA: Diagnosis not present

## 2017-09-06 DIAGNOSIS — J441 Chronic obstructive pulmonary disease with (acute) exacerbation: Secondary | ICD-10-CM | POA: Diagnosis not present

## 2017-09-06 DIAGNOSIS — I5030 Unspecified diastolic (congestive) heart failure: Secondary | ICD-10-CM | POA: Diagnosis not present

## 2017-09-06 DIAGNOSIS — J449 Chronic obstructive pulmonary disease, unspecified: Secondary | ICD-10-CM | POA: Diagnosis not present

## 2017-09-19 DIAGNOSIS — Z681 Body mass index (BMI) 19 or less, adult: Secondary | ICD-10-CM | POA: Diagnosis not present

## 2017-09-19 DIAGNOSIS — E1122 Type 2 diabetes mellitus with diabetic chronic kidney disease: Secondary | ICD-10-CM | POA: Diagnosis not present

## 2017-09-19 DIAGNOSIS — M818 Other osteoporosis without current pathological fracture: Secondary | ICD-10-CM | POA: Diagnosis not present

## 2017-09-19 DIAGNOSIS — I482 Chronic atrial fibrillation: Secondary | ICD-10-CM | POA: Diagnosis not present

## 2017-09-19 DIAGNOSIS — J441 Chronic obstructive pulmonary disease with (acute) exacerbation: Secondary | ICD-10-CM | POA: Diagnosis not present

## 2017-09-19 DIAGNOSIS — M542 Cervicalgia: Secondary | ICD-10-CM | POA: Diagnosis not present

## 2017-09-19 DIAGNOSIS — I5022 Chronic systolic (congestive) heart failure: Secondary | ICD-10-CM | POA: Diagnosis not present

## 2017-10-05 DIAGNOSIS — E1122 Type 2 diabetes mellitus with diabetic chronic kidney disease: Secondary | ICD-10-CM | POA: Diagnosis not present

## 2017-10-05 DIAGNOSIS — M818 Other osteoporosis without current pathological fracture: Secondary | ICD-10-CM | POA: Diagnosis not present

## 2017-10-05 DIAGNOSIS — J441 Chronic obstructive pulmonary disease with (acute) exacerbation: Secondary | ICD-10-CM | POA: Diagnosis not present

## 2017-10-06 DIAGNOSIS — I5030 Unspecified diastolic (congestive) heart failure: Secondary | ICD-10-CM | POA: Diagnosis not present

## 2017-10-06 DIAGNOSIS — J9610 Chronic respiratory failure, unspecified whether with hypoxia or hypercapnia: Secondary | ICD-10-CM | POA: Diagnosis not present

## 2017-10-06 DIAGNOSIS — J441 Chronic obstructive pulmonary disease with (acute) exacerbation: Secondary | ICD-10-CM | POA: Diagnosis not present

## 2017-10-06 DIAGNOSIS — I4891 Unspecified atrial fibrillation: Secondary | ICD-10-CM | POA: Diagnosis not present

## 2017-10-06 DIAGNOSIS — J449 Chronic obstructive pulmonary disease, unspecified: Secondary | ICD-10-CM | POA: Diagnosis not present

## 2017-10-18 DIAGNOSIS — Z7901 Long term (current) use of anticoagulants: Secondary | ICD-10-CM | POA: Diagnosis not present

## 2017-10-25 DIAGNOSIS — M818 Other osteoporosis without current pathological fracture: Secondary | ICD-10-CM | POA: Diagnosis not present

## 2017-10-25 DIAGNOSIS — J441 Chronic obstructive pulmonary disease with (acute) exacerbation: Secondary | ICD-10-CM | POA: Diagnosis not present

## 2017-10-25 DIAGNOSIS — E1122 Type 2 diabetes mellitus with diabetic chronic kidney disease: Secondary | ICD-10-CM | POA: Diagnosis not present

## 2017-10-25 DIAGNOSIS — I5022 Chronic systolic (congestive) heart failure: Secondary | ICD-10-CM | POA: Diagnosis not present

## 2017-10-25 DIAGNOSIS — M542 Cervicalgia: Secondary | ICD-10-CM | POA: Diagnosis not present

## 2017-10-25 DIAGNOSIS — Z681 Body mass index (BMI) 19 or less, adult: Secondary | ICD-10-CM | POA: Diagnosis not present

## 2017-10-25 DIAGNOSIS — I482 Chronic atrial fibrillation: Secondary | ICD-10-CM | POA: Diagnosis not present

## 2017-10-25 DIAGNOSIS — Z125 Encounter for screening for malignant neoplasm of prostate: Secondary | ICD-10-CM | POA: Diagnosis not present

## 2017-11-06 DIAGNOSIS — I4891 Unspecified atrial fibrillation: Secondary | ICD-10-CM | POA: Diagnosis not present

## 2017-11-06 DIAGNOSIS — I5030 Unspecified diastolic (congestive) heart failure: Secondary | ICD-10-CM | POA: Diagnosis not present

## 2017-11-06 DIAGNOSIS — J449 Chronic obstructive pulmonary disease, unspecified: Secondary | ICD-10-CM | POA: Diagnosis not present

## 2017-11-06 DIAGNOSIS — J9610 Chronic respiratory failure, unspecified whether with hypoxia or hypercapnia: Secondary | ICD-10-CM | POA: Diagnosis not present

## 2017-11-06 DIAGNOSIS — J441 Chronic obstructive pulmonary disease with (acute) exacerbation: Secondary | ICD-10-CM | POA: Diagnosis not present

## 2017-11-14 DIAGNOSIS — I482 Chronic atrial fibrillation: Secondary | ICD-10-CM | POA: Diagnosis not present

## 2017-12-07 DIAGNOSIS — I5030 Unspecified diastolic (congestive) heart failure: Secondary | ICD-10-CM | POA: Diagnosis not present

## 2017-12-07 DIAGNOSIS — I482 Chronic atrial fibrillation: Secondary | ICD-10-CM | POA: Diagnosis not present

## 2017-12-07 DIAGNOSIS — J449 Chronic obstructive pulmonary disease, unspecified: Secondary | ICD-10-CM | POA: Diagnosis not present

## 2017-12-07 DIAGNOSIS — J9610 Chronic respiratory failure, unspecified whether with hypoxia or hypercapnia: Secondary | ICD-10-CM | POA: Diagnosis not present

## 2017-12-07 DIAGNOSIS — I4891 Unspecified atrial fibrillation: Secondary | ICD-10-CM | POA: Diagnosis not present

## 2017-12-07 DIAGNOSIS — J441 Chronic obstructive pulmonary disease with (acute) exacerbation: Secondary | ICD-10-CM | POA: Diagnosis not present

## 2017-12-10 DIAGNOSIS — I482 Chronic atrial fibrillation: Secondary | ICD-10-CM | POA: Diagnosis not present

## 2018-01-04 DIAGNOSIS — I482 Chronic atrial fibrillation: Secondary | ICD-10-CM | POA: Diagnosis not present

## 2018-01-04 DIAGNOSIS — J449 Chronic obstructive pulmonary disease, unspecified: Secondary | ICD-10-CM | POA: Diagnosis not present

## 2018-01-04 DIAGNOSIS — I5030 Unspecified diastolic (congestive) heart failure: Secondary | ICD-10-CM | POA: Diagnosis not present

## 2018-01-04 DIAGNOSIS — J441 Chronic obstructive pulmonary disease with (acute) exacerbation: Secondary | ICD-10-CM | POA: Diagnosis not present

## 2018-01-04 DIAGNOSIS — I4891 Unspecified atrial fibrillation: Secondary | ICD-10-CM | POA: Diagnosis not present

## 2018-01-04 DIAGNOSIS — J9610 Chronic respiratory failure, unspecified whether with hypoxia or hypercapnia: Secondary | ICD-10-CM | POA: Diagnosis not present

## 2018-01-23 DIAGNOSIS — I482 Chronic atrial fibrillation: Secondary | ICD-10-CM | POA: Diagnosis not present

## 2018-01-23 DIAGNOSIS — M818 Other osteoporosis without current pathological fracture: Secondary | ICD-10-CM | POA: Diagnosis not present

## 2018-01-23 DIAGNOSIS — Z0000 Encounter for general adult medical examination without abnormal findings: Secondary | ICD-10-CM | POA: Diagnosis not present

## 2018-01-23 DIAGNOSIS — E1122 Type 2 diabetes mellitus with diabetic chronic kidney disease: Secondary | ICD-10-CM | POA: Diagnosis not present

## 2018-01-23 DIAGNOSIS — Z1389 Encounter for screening for other disorder: Secondary | ICD-10-CM | POA: Diagnosis not present

## 2018-01-23 DIAGNOSIS — Z681 Body mass index (BMI) 19 or less, adult: Secondary | ICD-10-CM | POA: Diagnosis not present

## 2018-01-23 DIAGNOSIS — J441 Chronic obstructive pulmonary disease with (acute) exacerbation: Secondary | ICD-10-CM | POA: Diagnosis not present

## 2018-01-23 DIAGNOSIS — I5022 Chronic systolic (congestive) heart failure: Secondary | ICD-10-CM | POA: Diagnosis not present

## 2018-01-25 DIAGNOSIS — Z7901 Long term (current) use of anticoagulants: Secondary | ICD-10-CM | POA: Diagnosis not present

## 2018-01-25 DIAGNOSIS — I482 Chronic atrial fibrillation: Secondary | ICD-10-CM | POA: Diagnosis not present

## 2018-01-31 DIAGNOSIS — Z681 Body mass index (BMI) 19 or less, adult: Secondary | ICD-10-CM | POA: Diagnosis not present

## 2018-01-31 DIAGNOSIS — E1122 Type 2 diabetes mellitus with diabetic chronic kidney disease: Secondary | ICD-10-CM | POA: Diagnosis not present

## 2018-01-31 DIAGNOSIS — M818 Other osteoporosis without current pathological fracture: Secondary | ICD-10-CM | POA: Diagnosis not present

## 2018-01-31 DIAGNOSIS — Z1389 Encounter for screening for other disorder: Secondary | ICD-10-CM | POA: Diagnosis not present

## 2018-01-31 DIAGNOSIS — Z0000 Encounter for general adult medical examination without abnormal findings: Secondary | ICD-10-CM | POA: Diagnosis not present

## 2018-01-31 DIAGNOSIS — I482 Chronic atrial fibrillation: Secondary | ICD-10-CM | POA: Diagnosis not present

## 2018-01-31 DIAGNOSIS — I5022 Chronic systolic (congestive) heart failure: Secondary | ICD-10-CM | POA: Diagnosis not present

## 2018-01-31 DIAGNOSIS — J441 Chronic obstructive pulmonary disease with (acute) exacerbation: Secondary | ICD-10-CM | POA: Diagnosis not present

## 2018-02-04 DIAGNOSIS — J449 Chronic obstructive pulmonary disease, unspecified: Secondary | ICD-10-CM | POA: Diagnosis not present

## 2018-02-04 DIAGNOSIS — I5030 Unspecified diastolic (congestive) heart failure: Secondary | ICD-10-CM | POA: Diagnosis not present

## 2018-02-04 DIAGNOSIS — I4891 Unspecified atrial fibrillation: Secondary | ICD-10-CM | POA: Diagnosis not present

## 2018-02-04 DIAGNOSIS — J9610 Chronic respiratory failure, unspecified whether with hypoxia or hypercapnia: Secondary | ICD-10-CM | POA: Diagnosis not present

## 2018-02-04 DIAGNOSIS — J441 Chronic obstructive pulmonary disease with (acute) exacerbation: Secondary | ICD-10-CM | POA: Diagnosis not present

## 2018-02-07 DIAGNOSIS — Z87891 Personal history of nicotine dependence: Secondary | ICD-10-CM | POA: Diagnosis not present

## 2018-02-07 DIAGNOSIS — Z136 Encounter for screening for cardiovascular disorders: Secondary | ICD-10-CM | POA: Diagnosis not present

## 2018-02-07 DIAGNOSIS — I77811 Abdominal aortic ectasia: Secondary | ICD-10-CM | POA: Diagnosis not present

## 2018-02-15 DIAGNOSIS — Z7901 Long term (current) use of anticoagulants: Secondary | ICD-10-CM | POA: Diagnosis not present

## 2018-03-06 DIAGNOSIS — J9610 Chronic respiratory failure, unspecified whether with hypoxia or hypercapnia: Secondary | ICD-10-CM | POA: Diagnosis not present

## 2018-03-06 DIAGNOSIS — I4891 Unspecified atrial fibrillation: Secondary | ICD-10-CM | POA: Diagnosis not present

## 2018-03-06 DIAGNOSIS — J449 Chronic obstructive pulmonary disease, unspecified: Secondary | ICD-10-CM | POA: Diagnosis not present

## 2018-03-06 DIAGNOSIS — I5030 Unspecified diastolic (congestive) heart failure: Secondary | ICD-10-CM | POA: Diagnosis not present

## 2018-03-06 DIAGNOSIS — J441 Chronic obstructive pulmonary disease with (acute) exacerbation: Secondary | ICD-10-CM | POA: Diagnosis not present

## 2018-03-08 DIAGNOSIS — I482 Chronic atrial fibrillation: Secondary | ICD-10-CM | POA: Diagnosis not present

## 2018-03-12 DIAGNOSIS — I482 Chronic atrial fibrillation: Secondary | ICD-10-CM | POA: Diagnosis not present

## 2018-03-12 DIAGNOSIS — E1122 Type 2 diabetes mellitus with diabetic chronic kidney disease: Secondary | ICD-10-CM | POA: Diagnosis not present

## 2018-03-12 DIAGNOSIS — J441 Chronic obstructive pulmonary disease with (acute) exacerbation: Secondary | ICD-10-CM | POA: Diagnosis not present

## 2018-03-12 DIAGNOSIS — I5022 Chronic systolic (congestive) heart failure: Secondary | ICD-10-CM | POA: Diagnosis not present

## 2018-04-05 DIAGNOSIS — I482 Chronic atrial fibrillation: Secondary | ICD-10-CM | POA: Diagnosis not present

## 2018-04-06 DIAGNOSIS — I4891 Unspecified atrial fibrillation: Secondary | ICD-10-CM | POA: Diagnosis not present

## 2018-04-06 DIAGNOSIS — I5030 Unspecified diastolic (congestive) heart failure: Secondary | ICD-10-CM | POA: Diagnosis not present

## 2018-04-06 DIAGNOSIS — J449 Chronic obstructive pulmonary disease, unspecified: Secondary | ICD-10-CM | POA: Diagnosis not present

## 2018-04-06 DIAGNOSIS — J441 Chronic obstructive pulmonary disease with (acute) exacerbation: Secondary | ICD-10-CM | POA: Diagnosis not present

## 2018-04-06 DIAGNOSIS — J9610 Chronic respiratory failure, unspecified whether with hypoxia or hypercapnia: Secondary | ICD-10-CM | POA: Diagnosis not present

## 2018-04-24 DIAGNOSIS — M545 Low back pain: Secondary | ICD-10-CM | POA: Diagnosis not present

## 2018-04-24 DIAGNOSIS — I482 Chronic atrial fibrillation: Secondary | ICD-10-CM | POA: Diagnosis not present

## 2018-04-24 DIAGNOSIS — Z0000 Encounter for general adult medical examination without abnormal findings: Secondary | ICD-10-CM | POA: Diagnosis not present

## 2018-04-24 DIAGNOSIS — E1122 Type 2 diabetes mellitus with diabetic chronic kidney disease: Secondary | ICD-10-CM | POA: Diagnosis not present

## 2018-04-24 DIAGNOSIS — M818 Other osteoporosis without current pathological fracture: Secondary | ICD-10-CM | POA: Diagnosis not present

## 2018-04-24 DIAGNOSIS — I5022 Chronic systolic (congestive) heart failure: Secondary | ICD-10-CM | POA: Diagnosis not present

## 2018-04-24 DIAGNOSIS — J441 Chronic obstructive pulmonary disease with (acute) exacerbation: Secondary | ICD-10-CM | POA: Diagnosis not present

## 2018-04-24 DIAGNOSIS — Z1389 Encounter for screening for other disorder: Secondary | ICD-10-CM | POA: Diagnosis not present

## 2018-04-24 DIAGNOSIS — Z681 Body mass index (BMI) 19 or less, adult: Secondary | ICD-10-CM | POA: Diagnosis not present

## 2018-05-03 DIAGNOSIS — I482 Chronic atrial fibrillation: Secondary | ICD-10-CM | POA: Diagnosis not present

## 2018-05-06 DIAGNOSIS — I4891 Unspecified atrial fibrillation: Secondary | ICD-10-CM | POA: Diagnosis not present

## 2018-05-06 DIAGNOSIS — J449 Chronic obstructive pulmonary disease, unspecified: Secondary | ICD-10-CM | POA: Diagnosis not present

## 2018-05-06 DIAGNOSIS — I5030 Unspecified diastolic (congestive) heart failure: Secondary | ICD-10-CM | POA: Diagnosis not present

## 2018-05-06 DIAGNOSIS — J9610 Chronic respiratory failure, unspecified whether with hypoxia or hypercapnia: Secondary | ICD-10-CM | POA: Diagnosis not present

## 2018-05-06 DIAGNOSIS — J441 Chronic obstructive pulmonary disease with (acute) exacerbation: Secondary | ICD-10-CM | POA: Diagnosis not present

## 2018-05-20 DIAGNOSIS — E119 Type 2 diabetes mellitus without complications: Secondary | ICD-10-CM | POA: Diagnosis not present

## 2018-05-20 DIAGNOSIS — Z7984 Long term (current) use of oral hypoglycemic drugs: Secondary | ICD-10-CM | POA: Diagnosis not present

## 2018-05-20 DIAGNOSIS — H25813 Combined forms of age-related cataract, bilateral: Secondary | ICD-10-CM | POA: Diagnosis not present

## 2018-06-06 DIAGNOSIS — J9610 Chronic respiratory failure, unspecified whether with hypoxia or hypercapnia: Secondary | ICD-10-CM | POA: Diagnosis not present

## 2018-06-06 DIAGNOSIS — J449 Chronic obstructive pulmonary disease, unspecified: Secondary | ICD-10-CM | POA: Diagnosis not present

## 2018-06-06 DIAGNOSIS — J441 Chronic obstructive pulmonary disease with (acute) exacerbation: Secondary | ICD-10-CM | POA: Diagnosis not present

## 2018-06-06 DIAGNOSIS — I4891 Unspecified atrial fibrillation: Secondary | ICD-10-CM | POA: Diagnosis not present

## 2018-06-06 DIAGNOSIS — I5030 Unspecified diastolic (congestive) heart failure: Secondary | ICD-10-CM | POA: Diagnosis not present

## 2018-06-07 DIAGNOSIS — I482 Chronic atrial fibrillation: Secondary | ICD-10-CM | POA: Diagnosis not present

## 2018-07-06 DIAGNOSIS — I482 Chronic atrial fibrillation: Secondary | ICD-10-CM | POA: Diagnosis not present

## 2018-07-06 DIAGNOSIS — I10 Essential (primary) hypertension: Secondary | ICD-10-CM | POA: Diagnosis not present

## 2018-07-06 DIAGNOSIS — R0689 Other abnormalities of breathing: Secondary | ICD-10-CM | POA: Diagnosis not present

## 2018-07-06 DIAGNOSIS — J441 Chronic obstructive pulmonary disease with (acute) exacerbation: Secondary | ICD-10-CM | POA: Diagnosis not present

## 2018-07-06 DIAGNOSIS — E1122 Type 2 diabetes mellitus with diabetic chronic kidney disease: Secondary | ICD-10-CM | POA: Diagnosis not present

## 2018-07-06 DIAGNOSIS — N183 Chronic kidney disease, stage 3 (moderate): Secondary | ICD-10-CM | POA: Diagnosis not present

## 2018-07-06 DIAGNOSIS — R000 Tachycardia, unspecified: Secondary | ICD-10-CM | POA: Diagnosis not present

## 2018-07-06 DIAGNOSIS — I130 Hypertensive heart and chronic kidney disease with heart failure and stage 1 through stage 4 chronic kidney disease, or unspecified chronic kidney disease: Secondary | ICD-10-CM | POA: Diagnosis not present

## 2018-07-06 DIAGNOSIS — I4891 Unspecified atrial fibrillation: Secondary | ICD-10-CM | POA: Diagnosis not present

## 2018-07-06 DIAGNOSIS — J9610 Chronic respiratory failure, unspecified whether with hypoxia or hypercapnia: Secondary | ICD-10-CM | POA: Diagnosis not present

## 2018-07-06 DIAGNOSIS — R918 Other nonspecific abnormal finding of lung field: Secondary | ICD-10-CM | POA: Diagnosis not present

## 2018-07-06 DIAGNOSIS — I5030 Unspecified diastolic (congestive) heart failure: Secondary | ICD-10-CM | POA: Diagnosis not present

## 2018-07-06 DIAGNOSIS — J9601 Acute respiratory failure with hypoxia: Secondary | ICD-10-CM | POA: Diagnosis not present

## 2018-07-06 DIAGNOSIS — J449 Chronic obstructive pulmonary disease, unspecified: Secondary | ICD-10-CM | POA: Diagnosis not present

## 2018-07-06 DIAGNOSIS — E440 Moderate protein-calorie malnutrition: Secondary | ICD-10-CM | POA: Diagnosis not present

## 2018-07-06 DIAGNOSIS — R0603 Acute respiratory distress: Secondary | ICD-10-CM | POA: Diagnosis not present

## 2018-07-06 DIAGNOSIS — I5043 Acute on chronic combined systolic (congestive) and diastolic (congestive) heart failure: Secondary | ICD-10-CM | POA: Diagnosis not present

## 2018-07-06 DIAGNOSIS — R0602 Shortness of breath: Secondary | ICD-10-CM | POA: Diagnosis not present

## 2018-07-06 DIAGNOSIS — Z681 Body mass index (BMI) 19 or less, adult: Secondary | ICD-10-CM | POA: Diagnosis not present

## 2018-07-06 DIAGNOSIS — R0902 Hypoxemia: Secondary | ICD-10-CM | POA: Diagnosis not present

## 2018-07-06 DIAGNOSIS — Z87891 Personal history of nicotine dependence: Secondary | ICD-10-CM | POA: Diagnosis not present

## 2018-07-06 DIAGNOSIS — I509 Heart failure, unspecified: Secondary | ICD-10-CM | POA: Diagnosis not present

## 2018-07-22 DIAGNOSIS — Z681 Body mass index (BMI) 19 or less, adult: Secondary | ICD-10-CM | POA: Diagnosis not present

## 2018-07-22 DIAGNOSIS — J449 Chronic obstructive pulmonary disease, unspecified: Secondary | ICD-10-CM | POA: Diagnosis not present

## 2018-07-22 DIAGNOSIS — I4821 Permanent atrial fibrillation: Secondary | ICD-10-CM | POA: Diagnosis not present

## 2018-07-22 DIAGNOSIS — J301 Allergic rhinitis due to pollen: Secondary | ICD-10-CM | POA: Diagnosis not present

## 2018-07-22 DIAGNOSIS — I5042 Chronic combined systolic (congestive) and diastolic (congestive) heart failure: Secondary | ICD-10-CM | POA: Diagnosis not present

## 2018-07-22 DIAGNOSIS — I482 Chronic atrial fibrillation, unspecified: Secondary | ICD-10-CM | POA: Diagnosis not present

## 2018-08-02 DIAGNOSIS — J301 Allergic rhinitis due to pollen: Secondary | ICD-10-CM | POA: Diagnosis not present

## 2018-08-02 DIAGNOSIS — I4821 Permanent atrial fibrillation: Secondary | ICD-10-CM | POA: Diagnosis not present

## 2018-08-02 DIAGNOSIS — Z681 Body mass index (BMI) 19 or less, adult: Secondary | ICD-10-CM | POA: Diagnosis not present

## 2018-08-02 DIAGNOSIS — I5042 Chronic combined systolic (congestive) and diastolic (congestive) heart failure: Secondary | ICD-10-CM | POA: Diagnosis not present

## 2018-08-02 DIAGNOSIS — I482 Chronic atrial fibrillation, unspecified: Secondary | ICD-10-CM | POA: Diagnosis not present

## 2018-08-02 DIAGNOSIS — J449 Chronic obstructive pulmonary disease, unspecified: Secondary | ICD-10-CM | POA: Diagnosis not present

## 2018-08-06 DIAGNOSIS — J449 Chronic obstructive pulmonary disease, unspecified: Secondary | ICD-10-CM | POA: Diagnosis not present

## 2018-08-06 DIAGNOSIS — J9610 Chronic respiratory failure, unspecified whether with hypoxia or hypercapnia: Secondary | ICD-10-CM | POA: Diagnosis not present

## 2018-08-06 DIAGNOSIS — I5030 Unspecified diastolic (congestive) heart failure: Secondary | ICD-10-CM | POA: Diagnosis not present

## 2018-08-06 DIAGNOSIS — J441 Chronic obstructive pulmonary disease with (acute) exacerbation: Secondary | ICD-10-CM | POA: Diagnosis not present

## 2018-08-06 DIAGNOSIS — I4891 Unspecified atrial fibrillation: Secondary | ICD-10-CM | POA: Diagnosis not present

## 2018-09-06 DIAGNOSIS — I482 Chronic atrial fibrillation, unspecified: Secondary | ICD-10-CM | POA: Diagnosis not present

## 2018-09-06 DIAGNOSIS — J449 Chronic obstructive pulmonary disease, unspecified: Secondary | ICD-10-CM | POA: Diagnosis not present

## 2018-09-06 DIAGNOSIS — I5030 Unspecified diastolic (congestive) heart failure: Secondary | ICD-10-CM | POA: Diagnosis not present

## 2018-09-06 DIAGNOSIS — J9610 Chronic respiratory failure, unspecified whether with hypoxia or hypercapnia: Secondary | ICD-10-CM | POA: Diagnosis not present

## 2018-09-06 DIAGNOSIS — J441 Chronic obstructive pulmonary disease with (acute) exacerbation: Secondary | ICD-10-CM | POA: Diagnosis not present

## 2018-09-06 DIAGNOSIS — I4891 Unspecified atrial fibrillation: Secondary | ICD-10-CM | POA: Diagnosis not present

## 2018-10-06 DIAGNOSIS — J9610 Chronic respiratory failure, unspecified whether with hypoxia or hypercapnia: Secondary | ICD-10-CM | POA: Diagnosis not present

## 2018-10-06 DIAGNOSIS — I5030 Unspecified diastolic (congestive) heart failure: Secondary | ICD-10-CM | POA: Diagnosis not present

## 2018-10-06 DIAGNOSIS — J441 Chronic obstructive pulmonary disease with (acute) exacerbation: Secondary | ICD-10-CM | POA: Diagnosis not present

## 2018-10-06 DIAGNOSIS — I4891 Unspecified atrial fibrillation: Secondary | ICD-10-CM | POA: Diagnosis not present

## 2018-10-06 DIAGNOSIS — J449 Chronic obstructive pulmonary disease, unspecified: Secondary | ICD-10-CM | POA: Diagnosis not present

## 2018-10-11 DIAGNOSIS — I4821 Permanent atrial fibrillation: Secondary | ICD-10-CM | POA: Diagnosis not present

## 2018-10-11 DIAGNOSIS — J449 Chronic obstructive pulmonary disease, unspecified: Secondary | ICD-10-CM | POA: Diagnosis not present

## 2018-10-11 DIAGNOSIS — I5042 Chronic combined systolic (congestive) and diastolic (congestive) heart failure: Secondary | ICD-10-CM | POA: Diagnosis not present

## 2018-10-11 DIAGNOSIS — J301 Allergic rhinitis due to pollen: Secondary | ICD-10-CM | POA: Diagnosis not present

## 2018-10-11 DIAGNOSIS — Z681 Body mass index (BMI) 19 or less, adult: Secondary | ICD-10-CM | POA: Diagnosis not present

## 2018-10-11 DIAGNOSIS — I482 Chronic atrial fibrillation, unspecified: Secondary | ICD-10-CM | POA: Diagnosis not present

## 2018-10-11 DIAGNOSIS — Z7901 Long term (current) use of anticoagulants: Secondary | ICD-10-CM | POA: Diagnosis not present

## 2018-10-22 DIAGNOSIS — J440 Chronic obstructive pulmonary disease with acute lower respiratory infection: Secondary | ICD-10-CM | POA: Diagnosis not present

## 2018-10-22 DIAGNOSIS — I482 Chronic atrial fibrillation, unspecified: Secondary | ICD-10-CM | POA: Diagnosis not present

## 2018-10-22 DIAGNOSIS — I5042 Chronic combined systolic (congestive) and diastolic (congestive) heart failure: Secondary | ICD-10-CM | POA: Diagnosis not present

## 2018-10-22 DIAGNOSIS — J301 Allergic rhinitis due to pollen: Secondary | ICD-10-CM | POA: Diagnosis not present

## 2018-10-22 DIAGNOSIS — Z681 Body mass index (BMI) 19 or less, adult: Secondary | ICD-10-CM | POA: Diagnosis not present

## 2018-10-25 DIAGNOSIS — J440 Chronic obstructive pulmonary disease with acute lower respiratory infection: Secondary | ICD-10-CM | POA: Diagnosis not present

## 2018-10-25 DIAGNOSIS — Z125 Encounter for screening for malignant neoplasm of prostate: Secondary | ICD-10-CM | POA: Diagnosis not present

## 2018-10-25 DIAGNOSIS — I482 Chronic atrial fibrillation, unspecified: Secondary | ICD-10-CM | POA: Diagnosis not present

## 2018-10-25 DIAGNOSIS — J301 Allergic rhinitis due to pollen: Secondary | ICD-10-CM | POA: Diagnosis not present

## 2018-10-25 DIAGNOSIS — Z681 Body mass index (BMI) 19 or less, adult: Secondary | ICD-10-CM | POA: Diagnosis not present

## 2018-10-25 DIAGNOSIS — I5042 Chronic combined systolic (congestive) and diastolic (congestive) heart failure: Secondary | ICD-10-CM | POA: Diagnosis not present

## 2018-11-06 DIAGNOSIS — J9610 Chronic respiratory failure, unspecified whether with hypoxia or hypercapnia: Secondary | ICD-10-CM | POA: Diagnosis not present

## 2018-11-06 DIAGNOSIS — I4891 Unspecified atrial fibrillation: Secondary | ICD-10-CM | POA: Diagnosis not present

## 2018-11-06 DIAGNOSIS — J441 Chronic obstructive pulmonary disease with (acute) exacerbation: Secondary | ICD-10-CM | POA: Diagnosis not present

## 2018-11-06 DIAGNOSIS — J449 Chronic obstructive pulmonary disease, unspecified: Secondary | ICD-10-CM | POA: Diagnosis not present

## 2018-11-06 DIAGNOSIS — I5030 Unspecified diastolic (congestive) heart failure: Secondary | ICD-10-CM | POA: Diagnosis not present

## 2018-11-29 DIAGNOSIS — I482 Chronic atrial fibrillation, unspecified: Secondary | ICD-10-CM | POA: Diagnosis not present

## 2018-12-07 DIAGNOSIS — J9610 Chronic respiratory failure, unspecified whether with hypoxia or hypercapnia: Secondary | ICD-10-CM | POA: Diagnosis not present

## 2018-12-07 DIAGNOSIS — I5030 Unspecified diastolic (congestive) heart failure: Secondary | ICD-10-CM | POA: Diagnosis not present

## 2018-12-07 DIAGNOSIS — J449 Chronic obstructive pulmonary disease, unspecified: Secondary | ICD-10-CM | POA: Diagnosis not present

## 2018-12-07 DIAGNOSIS — I4891 Unspecified atrial fibrillation: Secondary | ICD-10-CM | POA: Diagnosis not present

## 2018-12-07 DIAGNOSIS — J441 Chronic obstructive pulmonary disease with (acute) exacerbation: Secondary | ICD-10-CM | POA: Diagnosis not present

## 2018-12-20 DIAGNOSIS — I482 Chronic atrial fibrillation, unspecified: Secondary | ICD-10-CM | POA: Diagnosis not present

## 2019-01-03 DIAGNOSIS — I482 Chronic atrial fibrillation, unspecified: Secondary | ICD-10-CM | POA: Diagnosis not present

## 2019-01-05 DIAGNOSIS — I5030 Unspecified diastolic (congestive) heart failure: Secondary | ICD-10-CM | POA: Diagnosis not present

## 2019-01-05 DIAGNOSIS — I4891 Unspecified atrial fibrillation: Secondary | ICD-10-CM | POA: Diagnosis not present

## 2019-01-05 DIAGNOSIS — J441 Chronic obstructive pulmonary disease with (acute) exacerbation: Secondary | ICD-10-CM | POA: Diagnosis not present

## 2019-01-05 DIAGNOSIS — J449 Chronic obstructive pulmonary disease, unspecified: Secondary | ICD-10-CM | POA: Diagnosis not present

## 2019-01-29 DIAGNOSIS — I10 Essential (primary) hypertension: Secondary | ICD-10-CM | POA: Diagnosis not present

## 2019-01-29 DIAGNOSIS — Z0000 Encounter for general adult medical examination without abnormal findings: Secondary | ICD-10-CM | POA: Diagnosis not present

## 2019-01-29 DIAGNOSIS — J440 Chronic obstructive pulmonary disease with acute lower respiratory infection: Secondary | ICD-10-CM | POA: Diagnosis not present

## 2019-01-29 DIAGNOSIS — J301 Allergic rhinitis due to pollen: Secondary | ICD-10-CM | POA: Diagnosis not present

## 2019-01-29 DIAGNOSIS — Z681 Body mass index (BMI) 19 or less, adult: Secondary | ICD-10-CM | POA: Diagnosis not present

## 2019-01-29 DIAGNOSIS — Z1389 Encounter for screening for other disorder: Secondary | ICD-10-CM | POA: Diagnosis not present

## 2019-01-29 DIAGNOSIS — I482 Chronic atrial fibrillation, unspecified: Secondary | ICD-10-CM | POA: Diagnosis not present

## 2019-01-29 DIAGNOSIS — I5042 Chronic combined systolic (congestive) and diastolic (congestive) heart failure: Secondary | ICD-10-CM | POA: Diagnosis not present

## 2019-01-29 DIAGNOSIS — E119 Type 2 diabetes mellitus without complications: Secondary | ICD-10-CM | POA: Diagnosis not present

## 2019-02-05 DIAGNOSIS — I4891 Unspecified atrial fibrillation: Secondary | ICD-10-CM | POA: Diagnosis not present

## 2019-02-05 DIAGNOSIS — I5030 Unspecified diastolic (congestive) heart failure: Secondary | ICD-10-CM | POA: Diagnosis not present

## 2019-02-05 DIAGNOSIS — J441 Chronic obstructive pulmonary disease with (acute) exacerbation: Secondary | ICD-10-CM | POA: Diagnosis not present

## 2019-02-05 DIAGNOSIS — J449 Chronic obstructive pulmonary disease, unspecified: Secondary | ICD-10-CM | POA: Diagnosis not present

## 2019-02-28 DIAGNOSIS — Z7901 Long term (current) use of anticoagulants: Secondary | ICD-10-CM | POA: Diagnosis not present

## 2019-03-07 DIAGNOSIS — I5030 Unspecified diastolic (congestive) heart failure: Secondary | ICD-10-CM | POA: Diagnosis not present

## 2019-03-07 DIAGNOSIS — J441 Chronic obstructive pulmonary disease with (acute) exacerbation: Secondary | ICD-10-CM | POA: Diagnosis not present

## 2019-03-07 DIAGNOSIS — I4891 Unspecified atrial fibrillation: Secondary | ICD-10-CM | POA: Diagnosis not present

## 2019-03-07 DIAGNOSIS — J449 Chronic obstructive pulmonary disease, unspecified: Secondary | ICD-10-CM | POA: Diagnosis not present

## 2019-03-24 DIAGNOSIS — Z681 Body mass index (BMI) 19 or less, adult: Secondary | ICD-10-CM | POA: Diagnosis not present

## 2019-03-24 DIAGNOSIS — H6120 Impacted cerumen, unspecified ear: Secondary | ICD-10-CM | POA: Diagnosis not present

## 2019-03-28 DIAGNOSIS — Z7901 Long term (current) use of anticoagulants: Secondary | ICD-10-CM | POA: Diagnosis not present

## 2019-04-07 DIAGNOSIS — J441 Chronic obstructive pulmonary disease with (acute) exacerbation: Secondary | ICD-10-CM | POA: Diagnosis not present

## 2019-04-07 DIAGNOSIS — I5030 Unspecified diastolic (congestive) heart failure: Secondary | ICD-10-CM | POA: Diagnosis not present

## 2019-04-07 DIAGNOSIS — J449 Chronic obstructive pulmonary disease, unspecified: Secondary | ICD-10-CM | POA: Diagnosis not present

## 2019-04-07 DIAGNOSIS — I4891 Unspecified atrial fibrillation: Secondary | ICD-10-CM | POA: Diagnosis not present

## 2019-04-25 DIAGNOSIS — Z7901 Long term (current) use of anticoagulants: Secondary | ICD-10-CM | POA: Diagnosis not present

## 2019-04-30 DIAGNOSIS — N183 Chronic kidney disease, stage 3 (moderate): Secondary | ICD-10-CM | POA: Diagnosis not present

## 2019-04-30 DIAGNOSIS — M542 Cervicalgia: Secondary | ICD-10-CM | POA: Diagnosis not present

## 2019-04-30 DIAGNOSIS — Z681 Body mass index (BMI) 19 or less, adult: Secondary | ICD-10-CM | POA: Diagnosis not present

## 2019-04-30 DIAGNOSIS — Z0000 Encounter for general adult medical examination without abnormal findings: Secondary | ICD-10-CM | POA: Diagnosis not present

## 2019-05-01 DIAGNOSIS — M47812 Spondylosis without myelopathy or radiculopathy, cervical region: Secondary | ICD-10-CM | POA: Diagnosis not present

## 2019-05-01 DIAGNOSIS — M542 Cervicalgia: Secondary | ICD-10-CM | POA: Diagnosis not present

## 2019-05-05 DIAGNOSIS — E1122 Type 2 diabetes mellitus with diabetic chronic kidney disease: Secondary | ICD-10-CM | POA: Diagnosis not present

## 2019-05-05 DIAGNOSIS — I10 Essential (primary) hypertension: Secondary | ICD-10-CM | POA: Diagnosis not present

## 2019-05-05 DIAGNOSIS — J449 Chronic obstructive pulmonary disease, unspecified: Secondary | ICD-10-CM | POA: Diagnosis not present

## 2019-05-07 DIAGNOSIS — J441 Chronic obstructive pulmonary disease with (acute) exacerbation: Secondary | ICD-10-CM | POA: Diagnosis not present

## 2019-05-07 DIAGNOSIS — I5030 Unspecified diastolic (congestive) heart failure: Secondary | ICD-10-CM | POA: Diagnosis not present

## 2019-05-07 DIAGNOSIS — J449 Chronic obstructive pulmonary disease, unspecified: Secondary | ICD-10-CM | POA: Diagnosis not present

## 2019-05-07 DIAGNOSIS — I4891 Unspecified atrial fibrillation: Secondary | ICD-10-CM | POA: Diagnosis not present

## 2019-05-16 DIAGNOSIS — Z7901 Long term (current) use of anticoagulants: Secondary | ICD-10-CM | POA: Diagnosis not present

## 2019-06-07 DIAGNOSIS — I5030 Unspecified diastolic (congestive) heart failure: Secondary | ICD-10-CM | POA: Diagnosis not present

## 2019-06-07 DIAGNOSIS — J449 Chronic obstructive pulmonary disease, unspecified: Secondary | ICD-10-CM | POA: Diagnosis not present

## 2019-06-07 DIAGNOSIS — I4891 Unspecified atrial fibrillation: Secondary | ICD-10-CM | POA: Diagnosis not present

## 2019-06-07 DIAGNOSIS — J441 Chronic obstructive pulmonary disease with (acute) exacerbation: Secondary | ICD-10-CM | POA: Diagnosis not present

## 2019-06-13 DIAGNOSIS — Z7901 Long term (current) use of anticoagulants: Secondary | ICD-10-CM | POA: Diagnosis not present

## 2019-07-04 DIAGNOSIS — Z7901 Long term (current) use of anticoagulants: Secondary | ICD-10-CM | POA: Diagnosis not present

## 2019-07-08 DIAGNOSIS — J441 Chronic obstructive pulmonary disease with (acute) exacerbation: Secondary | ICD-10-CM | POA: Diagnosis not present

## 2019-07-08 DIAGNOSIS — J449 Chronic obstructive pulmonary disease, unspecified: Secondary | ICD-10-CM | POA: Diagnosis not present

## 2019-07-08 DIAGNOSIS — I5030 Unspecified diastolic (congestive) heart failure: Secondary | ICD-10-CM | POA: Diagnosis not present

## 2019-07-08 DIAGNOSIS — I4891 Unspecified atrial fibrillation: Secondary | ICD-10-CM | POA: Diagnosis not present

## 2019-07-31 DIAGNOSIS — E1121 Type 2 diabetes mellitus with diabetic nephropathy: Secondary | ICD-10-CM | POA: Diagnosis not present

## 2019-07-31 DIAGNOSIS — I509 Heart failure, unspecified: Secondary | ICD-10-CM | POA: Diagnosis not present

## 2019-07-31 DIAGNOSIS — M109 Gout, unspecified: Secondary | ICD-10-CM | POA: Diagnosis not present

## 2019-07-31 DIAGNOSIS — I10 Essential (primary) hypertension: Secondary | ICD-10-CM | POA: Diagnosis not present

## 2019-07-31 DIAGNOSIS — Z681 Body mass index (BMI) 19 or less, adult: Secondary | ICD-10-CM | POA: Diagnosis not present

## 2019-07-31 DIAGNOSIS — I5042 Chronic combined systolic (congestive) and diastolic (congestive) heart failure: Secondary | ICD-10-CM | POA: Diagnosis not present

## 2019-07-31 DIAGNOSIS — E782 Mixed hyperlipidemia: Secondary | ICD-10-CM | POA: Diagnosis not present

## 2019-07-31 DIAGNOSIS — Z7901 Long term (current) use of anticoagulants: Secondary | ICD-10-CM | POA: Diagnosis not present

## 2019-07-31 DIAGNOSIS — E119 Type 2 diabetes mellitus without complications: Secondary | ICD-10-CM | POA: Diagnosis not present

## 2019-08-07 DIAGNOSIS — I5030 Unspecified diastolic (congestive) heart failure: Secondary | ICD-10-CM | POA: Diagnosis not present

## 2019-08-07 DIAGNOSIS — I4891 Unspecified atrial fibrillation: Secondary | ICD-10-CM | POA: Diagnosis not present

## 2019-08-07 DIAGNOSIS — J449 Chronic obstructive pulmonary disease, unspecified: Secondary | ICD-10-CM | POA: Diagnosis not present

## 2019-08-07 DIAGNOSIS — J441 Chronic obstructive pulmonary disease with (acute) exacerbation: Secondary | ICD-10-CM | POA: Diagnosis not present

## 2019-09-05 DIAGNOSIS — Z7901 Long term (current) use of anticoagulants: Secondary | ICD-10-CM | POA: Diagnosis not present

## 2019-09-07 DIAGNOSIS — I5030 Unspecified diastolic (congestive) heart failure: Secondary | ICD-10-CM | POA: Diagnosis not present

## 2019-09-07 DIAGNOSIS — J441 Chronic obstructive pulmonary disease with (acute) exacerbation: Secondary | ICD-10-CM | POA: Diagnosis not present

## 2019-09-07 DIAGNOSIS — I4891 Unspecified atrial fibrillation: Secondary | ICD-10-CM | POA: Diagnosis not present

## 2019-09-07 DIAGNOSIS — J449 Chronic obstructive pulmonary disease, unspecified: Secondary | ICD-10-CM | POA: Diagnosis not present

## 2019-10-07 DIAGNOSIS — J441 Chronic obstructive pulmonary disease with (acute) exacerbation: Secondary | ICD-10-CM | POA: Diagnosis not present

## 2019-10-07 DIAGNOSIS — J449 Chronic obstructive pulmonary disease, unspecified: Secondary | ICD-10-CM | POA: Diagnosis not present

## 2019-10-07 DIAGNOSIS — I4891 Unspecified atrial fibrillation: Secondary | ICD-10-CM | POA: Diagnosis not present

## 2019-10-07 DIAGNOSIS — I5030 Unspecified diastolic (congestive) heart failure: Secondary | ICD-10-CM | POA: Diagnosis not present

## 2019-10-18 ENCOUNTER — Emergency Department (HOSPITAL_COMMUNITY): Payer: Medicare HMO

## 2019-10-18 ENCOUNTER — Other Ambulatory Visit: Payer: Self-pay

## 2019-10-18 ENCOUNTER — Inpatient Hospital Stay (HOSPITAL_COMMUNITY)
Admission: EM | Admit: 2019-10-18 | Discharge: 2019-10-28 | DRG: 871 | Disposition: A | Discharge status: Home Health | Payer: Medicare HMO | Attending: Internal Medicine | Admitting: Internal Medicine

## 2019-10-18 ENCOUNTER — Encounter (HOSPITAL_COMMUNITY): Payer: Self-pay | Admitting: Emergency Medicine

## 2019-10-18 DIAGNOSIS — J9621 Acute and chronic respiratory failure with hypoxia: Secondary | ICD-10-CM | POA: Diagnosis present

## 2019-10-18 DIAGNOSIS — J690 Pneumonitis due to inhalation of food and vomit: Secondary | ICD-10-CM | POA: Diagnosis not present

## 2019-10-18 DIAGNOSIS — I110 Hypertensive heart disease with heart failure: Secondary | ICD-10-CM | POA: Diagnosis present

## 2019-10-18 DIAGNOSIS — J449 Chronic obstructive pulmonary disease, unspecified: Secondary | ICD-10-CM | POA: Diagnosis present

## 2019-10-18 DIAGNOSIS — I5032 Chronic diastolic (congestive) heart failure: Secondary | ICD-10-CM | POA: Diagnosis not present

## 2019-10-18 DIAGNOSIS — R0602 Shortness of breath: Secondary | ICD-10-CM

## 2019-10-18 DIAGNOSIS — Z7951 Long term (current) use of inhaled steroids: Secondary | ICD-10-CM

## 2019-10-18 DIAGNOSIS — Z20822 Contact with and (suspected) exposure to covid-19: Secondary | ICD-10-CM | POA: Diagnosis present

## 2019-10-18 DIAGNOSIS — Z9981 Dependence on supplemental oxygen: Secondary | ICD-10-CM

## 2019-10-18 DIAGNOSIS — E785 Hyperlipidemia, unspecified: Secondary | ICD-10-CM | POA: Diagnosis present

## 2019-10-18 DIAGNOSIS — I4891 Unspecified atrial fibrillation: Secondary | ICD-10-CM | POA: Diagnosis not present

## 2019-10-18 DIAGNOSIS — K828 Other specified diseases of gallbladder: Secondary | ICD-10-CM | POA: Diagnosis present

## 2019-10-18 DIAGNOSIS — R52 Pain, unspecified: Secondary | ICD-10-CM | POA: Diagnosis not present

## 2019-10-18 DIAGNOSIS — N3289 Other specified disorders of bladder: Secondary | ICD-10-CM | POA: Diagnosis present

## 2019-10-18 DIAGNOSIS — K8020 Calculus of gallbladder without cholecystitis without obstruction: Secondary | ICD-10-CM | POA: Diagnosis present

## 2019-10-18 DIAGNOSIS — Z7901 Long term (current) use of anticoagulants: Secondary | ICD-10-CM | POA: Diagnosis not present

## 2019-10-18 DIAGNOSIS — Z79899 Other long term (current) drug therapy: Secondary | ICD-10-CM

## 2019-10-18 DIAGNOSIS — E11649 Type 2 diabetes mellitus with hypoglycemia without coma: Secondary | ICD-10-CM | POA: Diagnosis not present

## 2019-10-18 DIAGNOSIS — R791 Abnormal coagulation profile: Secondary | ICD-10-CM | POA: Diagnosis not present

## 2019-10-18 DIAGNOSIS — R338 Other retention of urine: Secondary | ICD-10-CM | POA: Diagnosis not present

## 2019-10-18 DIAGNOSIS — K5730 Diverticulosis of large intestine without perforation or abscess without bleeding: Secondary | ICD-10-CM | POA: Diagnosis present

## 2019-10-18 DIAGNOSIS — I340 Nonrheumatic mitral (valve) insufficiency: Secondary | ICD-10-CM | POA: Diagnosis not present

## 2019-10-18 DIAGNOSIS — E119 Type 2 diabetes mellitus without complications: Secondary | ICD-10-CM

## 2019-10-18 DIAGNOSIS — R112 Nausea with vomiting, unspecified: Secondary | ICD-10-CM | POA: Diagnosis not present

## 2019-10-18 DIAGNOSIS — D62 Acute posthemorrhagic anemia: Secondary | ICD-10-CM | POA: Diagnosis not present

## 2019-10-18 DIAGNOSIS — R339 Retention of urine, unspecified: Secondary | ICD-10-CM | POA: Diagnosis not present

## 2019-10-18 DIAGNOSIS — K529 Noninfective gastroenteritis and colitis, unspecified: Secondary | ICD-10-CM

## 2019-10-18 DIAGNOSIS — R079 Chest pain, unspecified: Secondary | ICD-10-CM

## 2019-10-18 DIAGNOSIS — I361 Nonrheumatic tricuspid (valve) insufficiency: Secondary | ICD-10-CM | POA: Diagnosis not present

## 2019-10-18 DIAGNOSIS — I10 Essential (primary) hypertension: Secondary | ICD-10-CM

## 2019-10-18 DIAGNOSIS — J189 Pneumonia, unspecified organism: Secondary | ICD-10-CM

## 2019-10-18 DIAGNOSIS — D509 Iron deficiency anemia, unspecified: Secondary | ICD-10-CM | POA: Diagnosis present

## 2019-10-18 DIAGNOSIS — R1110 Vomiting, unspecified: Secondary | ICD-10-CM | POA: Diagnosis not present

## 2019-10-18 DIAGNOSIS — R1084 Generalized abdominal pain: Secondary | ICD-10-CM | POA: Diagnosis not present

## 2019-10-18 DIAGNOSIS — I452 Bifascicular block: Secondary | ICD-10-CM | POA: Diagnosis not present

## 2019-10-18 DIAGNOSIS — I482 Chronic atrial fibrillation, unspecified: Secondary | ICD-10-CM | POA: Diagnosis not present

## 2019-10-18 DIAGNOSIS — Z87891 Personal history of nicotine dependence: Secondary | ICD-10-CM

## 2019-10-18 DIAGNOSIS — K920 Hematemesis: Secondary | ICD-10-CM | POA: Diagnosis not present

## 2019-10-18 DIAGNOSIS — I248 Other forms of acute ischemic heart disease: Secondary | ICD-10-CM | POA: Diagnosis not present

## 2019-10-18 DIAGNOSIS — A419 Sepsis, unspecified organism: Secondary | ICD-10-CM | POA: Diagnosis not present

## 2019-10-18 DIAGNOSIS — R58 Hemorrhage, not elsewhere classified: Secondary | ICD-10-CM | POA: Diagnosis not present

## 2019-10-18 DIAGNOSIS — Z7984 Long term (current) use of oral hypoglycemic drugs: Secondary | ICD-10-CM

## 2019-10-18 HISTORY — DX: Unspecified atrial fibrillation: I48.91

## 2019-10-18 HISTORY — DX: Essential (primary) hypertension: I10

## 2019-10-18 HISTORY — DX: Type 2 diabetes mellitus without complications: E11.9

## 2019-10-18 HISTORY — DX: Heart failure, unspecified: I50.9

## 2019-10-18 HISTORY — DX: Chronic obstructive pulmonary disease, unspecified: J44.9

## 2019-10-18 LAB — CBC WITH DIFFERENTIAL/PLATELET
Abs Immature Granulocytes: 0.08 10*3/uL — ABNORMAL HIGH (ref 0.00–0.07)
Basophils Absolute: 0 10*3/uL (ref 0.0–0.1)
Basophils Relative: 0 %
Eosinophils Absolute: 0 10*3/uL (ref 0.0–0.5)
Eosinophils Relative: 0 %
HCT: 34.3 % — ABNORMAL LOW (ref 39.0–52.0)
Hemoglobin: 11 g/dL — ABNORMAL LOW (ref 13.0–17.0)
Immature Granulocytes: 1 %
Lymphocytes Relative: 5 %
Lymphs Abs: 0.7 10*3/uL (ref 0.7–4.0)
MCH: 31.1 pg (ref 26.0–34.0)
MCHC: 32.1 g/dL (ref 30.0–36.0)
MCV: 96.9 fL (ref 80.0–100.0)
Monocytes Absolute: 1.3 10*3/uL — ABNORMAL HIGH (ref 0.1–1.0)
Monocytes Relative: 8 %
Neutro Abs: 13.3 10*3/uL — ABNORMAL HIGH (ref 1.7–7.7)
Neutrophils Relative %: 86 %
Platelets: 349 10*3/uL (ref 150–400)
RBC: 3.54 MIL/uL — ABNORMAL LOW (ref 4.22–5.81)
RDW: 17.9 % — ABNORMAL HIGH (ref 11.5–15.5)
WBC: 15.3 10*3/uL — ABNORMAL HIGH (ref 4.0–10.5)
nRBC: 0 % (ref 0.0–0.2)

## 2019-10-18 MED ORDER — IOHEXOL 300 MG/ML  SOLN
100.0000 mL | Freq: Once | INTRAMUSCULAR | Status: AC | PRN
  Administered 2019-10-19: 02:00:00 100 mL via INTRAVENOUS

## 2019-10-18 MED ORDER — LACTATED RINGERS IV BOLUS
1000.0000 mL | Freq: Once | INTRAVENOUS | Status: AC
  Administered 2019-10-18: 23:00:00 1000 mL via INTRAVENOUS

## 2019-10-18 NOTE — ED Triage Notes (Signed)
Pt c/o of abd pain, N/V, and sob. Pt on baseline 3L [place] at home, arrives on 4L [place]. Pt states he has ben feeling bad x1 week.

## 2019-10-18 NOTE — ED Provider Notes (Signed)
Emergency Department Provider Note   I have reviewed the triage vital signs and the nursing notes.   HISTORY  Chief Complaint Abdominal Pain and Shortness of Breath   HPI Blake Wise is a 74 y.o. male who presents the emergency department today with 1 weeks worth of left upper quadrant abdominal pain along with progressively worsening dyspnea, nonproductive cough, vomiting brown stuff and shortness of breath.  He stated that he did have some swelling in his legs prior to all this starting but then that improved with all of vomiting and inability to take intake.  He noticed palpitations as well today.  No significant chest pain.  No rashes.  No fevers.  No sick contacts.  Has a history of atrial fibrillation, CHF, diabetes, hypertension COPD and states compliance with all his medications to include lasix, diltiazem and metoprolol.   No other associated or modifying symptoms.    Past Medical History:  Diagnosis Date  . A-fib ([place])   . CHF (congestive heart failure) ([place])   . COPD (chronic obstructive pulmonary disease) ([place])   . Diabetes mellitus without complication ([place])   . Hypertension     Patient Active Problem List   Diagnosis Date Noted  . Atrial fibrillation with RVR ([place]) 10/19/2019  . Chronic diastolic CHF (congestive heart failure) ([place]) 10/19/2019  . Chest pain 10/19/2019  . DM2 (diabetes mellitus, type 2) ([place]) 10/19/2019  . HTN (hypertension) 10/19/2019  . Acute urinary retention 10/19/2019  . Elevated INR 10/19/2019  . Community acquired pneumonia 10/19/2019  . Enteritis 10/19/2019    History reviewed. No pertinent surgical history.    Allergies Patient has no known allergies.  No family history on file.  Social History Social History   Tobacco Use  . Smoking status: Former Research scientist (life sciences)  . Smokeless tobacco: Never Used  Substance Use Topics  . Alcohol use: Not on file  . Drug use: Never    Review of Systems  All other systems negative except  as documented in the HPI. All pertinent positives and negatives as reviewed in the HPI. ____________________________________________   PHYSICAL EXAM:  VITAL SIGNS: ED Triage Vitals  Enc Vitals Group     BP 10/18/19 2253 (!) 178/98     Pulse Rate 10/18/19 2253 81     Resp 10/18/19 2253 20     Temp 10/18/19 2256 98.4 F (36.9 C)     Temp Source 10/18/19 2256 Oral     SpO2 10/18/19 2253 98 %     Weight 10/18/19 2253 123 lb (55.8 kg)     Height 10/18/19 2253 5\' 11"  (1.803 m)    Constitutional: Alert and oriented. Well appearing and in no acute distress. Eyes: Conjunctivae are normal. PERRL. EOMI. Head: Atraumatic. Nose: No congestion/rhinnorhea. Mouth/Throat: Mucous membranes are moist.  Oropharynx non-erythematous. Neck: No stridor.  No meningeal signs.   Cardiovascular: tachycardic rate, irregular rhythm. Good peripheral circulation. Grossly normal heart sounds.   Respiratory: tachypneic respiratory effort.  No retractions. Lungs rhonchorous. Gastrointestinal: Soft and nontender. No distention.  Musculoskeletal: No lower extremity tenderness nor edema. No gross deformities of extremities. Neurologic:  Normal speech and language. No gross focal neurologic deficits are appreciated.  Skin:  Skin is warm, dry and intact. No rash noted.  ____________________________________________   LABS (all labs ordered are listed, but only abnormal results are displayed)  Labs Reviewed  CBC WITH DIFFERENTIAL/PLATELET - Abnormal; Notable for the following components:      Result Value   WBC 15.3 (*)  RBC 3.54 (*)    Hemoglobin 11.0 (*)    HCT 34.3 (*)    RDW 17.9 (*)    Neutro Abs 13.3 (*)    Monocytes Absolute 1.3 (*)    Abs Immature Granulocytes 0.08 (*)    All other components within normal limits  BRAIN NATRIURETIC PEPTIDE - Abnormal; Notable for the following components:   B Natriuretic Peptide 848.0 (*)    All other components within normal limits  PROTIME-INR - Abnormal;  Notable for the following components:   Prothrombin Time 81.3 (*)    INR 10.2 (*)    All other components within normal limits  COMPREHENSIVE METABOLIC PANEL - Abnormal; Notable for the following components:   Sodium 147 (*)    Glucose, Bld 156 (*)    BUN 35 (*)    Creatinine, Ser 1.61 (*)    Calcium 8.6 (*)    AST 55 (*)    Total Bilirubin 2.5 (*)    GFR calc non Af Amer 42 (*)    GFR calc Af Amer 48 (*)    All other components within normal limits  LACTIC ACID, PLASMA - Abnormal; Notable for the following components:   Lactic Acid, Venous 2.4 (*)    All other components within normal limits  URINALYSIS, ROUTINE W REFLEX MICROSCOPIC - Abnormal; Notable for the following components:   Protein, ur 30 (*)    All other components within normal limits  GLUCOSE, CAPILLARY - Abnormal; Notable for the following components:   Glucose-Capillary 107 (*)    All other components within normal limits  GLUCOSE, CAPILLARY - Abnormal; Notable for the following components:   Glucose-Capillary 117 (*)    All other components within normal limits  TROPONIN I (HIGH SENSITIVITY) - Abnormal; Notable for the following components:   Troponin I (High Sensitivity) 30 (*)    All other components within normal limits  TROPONIN I (HIGH SENSITIVITY) - Abnormal; Notable for the following components:   Troponin I (High Sensitivity) 32 (*)    All other components within normal limits  RESPIRATORY PANEL BY RT PCR (FLU A&B, COVID)  CULTURE, BLOOD (ROUTINE X 2)  CULTURE, BLOOD (ROUTINE X 2)  EXPECTORATED SPUTUM ASSESSMENT W REFEX TO RESP CULTURE  MRSA PCR SCREENING  LIPASE, BLOOD  LACTIC ACID, PLASMA  MAGNESIUM  LACTIC ACID, PLASMA  LACTIC ACID, PLASMA  TROPONIN I (HIGH SENSITIVITY)   ____________________________________________  EKG   EKG Interpretation  Date/Time:  Saturday October 18 2019 22:59:10 EST Ventricular Rate:  181 PR Interval:    QRS Duration: 101 QT Interval:  255 QTC  Calculation: 455 R Axis:   -79 Text Interpretation: Atrial fibrillation with rapid V-rate Paired ventricular premature complexes Incomplete RBBB and LAFB RVH with secondary repolarization abnrm Nonspecific T abnormalities, lateral leads No old tracing to compare Confirmed by Merrily Pew [phone]) on 10/18/2019 11:09:18 PM       ____________________________________________  RADIOLOGY  CT ABDOMEN PELVIS W CONTRAST  Result Date: 10/19/2019 CLINICAL DATA:  Abdominal pain. Nausea and vomiting. Abdominal abscess/infection suspected EXAM: CT ABDOMEN AND PELVIS WITH CONTRAST TECHNIQUE: Multidetector CT imaging of the abdomen and pelvis was performed using the standard protocol following bolus administration of intravenous contrast. CONTRAST:  167mL OMNIPAQUE IOHEXOL 300 MG/ML  SOLN COMPARISON:  Chest radiograph yesterday. Report from abdominopelvic CT 12/24/2015, images not available FINDINGS: Lower chest: Emphysema. Ground-glass opacities in the right middle lobe are partially included. Multi chamber cardiomegaly with primarily right heart dilatation. Contrast refluxes into the hepatic veins  and IVC. Hepatobiliary: Stones and sludge in the gallbladder without gallbladder inflammation. No biliary dilatation. Tiny subcentimeter hypodensity in the subcapsular right lobe of the liver is too small to characterize. Contrast refluxes into the hepatic veins and IVC. Pancreas: No ductal dilatation or inflammation. Spleen: Heterogeneous enhancement likely due to phase of contrast. Spleen is normal in size. Adrenals/Urinary Tract: Normal adrenal glands. No hydronephrosis. No perinephric edema. Small cyst in the posterior left kidney. Urinary bladder is markedly distended to the umbilicus. Bladder volume = 1500 cm^3. Bladder slightly protrudes into the right obturator fossa. No bladder wall thickening. Questionable diverticulum at the bladder dome. Stomach/Bowel: Fluid-filled stomach, with minimal fluid in the distal  esophagus. There is no gastric wall thickening. Wall thickening with mucosal enhancement involving the fourth portion of the duodenum, and more prominently the proximal jejunum. Moderate length segment of small bowel involvement. No obstruction. There is associated mesenteric edema and free fluid. No pneumatosis or perforation. More distal small bowel are nondistended, slightly fluid-filled but noninflamed. There is small bowel interposition lateral to the right lobe of the liver. Colonic diverticulosis without diverticulitis. The appendix is not well visualized. There is no evidence of appendicitis. Vascular/Lymphatic: Moderate aortic atherosclerosis. No aortic aneurysm. Portal vein is patent. The mesenteric artery and veins are patent. No gross adenopathy. Reproductive: Prostate is unremarkable. Other: Small to moderate volume of free fluid/ascites. Moderate mesenteric edema and free fluid. No free air. No intra-abdominal abscess. Musculoskeletal: Degenerative change in the spine. Avascular necrosis of the femoral heads without collapse. IMPRESSION: 1. Mark wall thickening with mucosal enhancement involving the fourth portion of the duodenum and proximal jejunum, with associated mesenteric edema and free fluid. Findings may represent infectious or inflammatory enteritis versus bowel angioedema. No evidence of bowel ischemia or perforation. 2. Markedly distended urinary bladder to the umbilicus. Questionable bladder dome diverticulum. Similar findings were described on prior exam, this may be chronic. 3. Gallstones and sludge in the gallbladder without gallbladder inflammation. 4. Colonic diverticulosis without diverticulitis. 5. Multi chamber cardiomegaly with primarily right heart dilatation, and contrast refluxing into the hepatic veins and IVC consistent with right heart failure. Aortic Atherosclerosis (ICD10-I70.0) and Emphysema (ICD10-J43.9). Electronically Signed   By: Keith Rake M.D.   On: 10/19/2019  03:01   DG Chest Portable 1 View  Result Date: 10/19/2019 CLINICAL DATA:  Shortness of breath. Nausea and vomiting. EXAM: PORTABLE CHEST 1 VIEW COMPARISON:  Radiograph 07/10/2018. FINDINGS: Patchy heterogeneous airspace opacities throughout the right right upper and midlung zones. Possible minimal fluid in the right minor fissure. No definite airspace disease in the left lung. Mild cardiomegaly is unchanged. Mild aortic atherosclerosis. No pneumothorax. Chronic hyperinflation. No acute osseous abnormalities are seen. IMPRESSION: 1. Patchy heterogeneous airspace opacities in the right upper and midlung zones, suspicious for pneumonia. Unilateral involvement more typical of broncho pneumonia rather than COVID. 2. Stable mild cardiomegaly and hyperinflation. Electronically Signed   By: Keith Rake M.D.   On: 10/19/2019 00:23    ____________________________________________   PROCEDURES  Procedure(s) performed:   Procedures   ____________________________________________   INITIAL IMPRESSION / ASSESSMENT AND PLAN / ED COURSE  Patient is here in atrial fibrillation with rapid ventricular response.  His blood pressure is maintaining and he does not appear to be severely fluid overloaded, hypoxic or having chest pain or other signs of endorgan damage at this time.  Mental status is appropriate.  Will obtain chest x-ray, flu/coronavirus testing, work-up for heart failure exacerbation and evaluate his abdominal pain.  I suspect that  some of his rate is related to dehydration secondary to the vomiting and thus will discuss some fluids for now as he is stable and does not need emergent intervention.  Ultimately patient got started on diltiazem.  Is heart rate and initially improved with fluids but then subsequently reverted back into A. fib RVR.  Patient found to have what looks like choledocholithiasis but no evidence of pancreatitis or gallbladder wall thickening or elevated liver enzymes to suggest  that this is symptomatic.  He did have very enlarged bladder on the CT scan so a catheter was placed to 1400 cc were drained which helped with his belly pain.  He was found to have enteritis of some etiology so some Flagyl was added onto his antibiotics. INR>10 with mild blood streaked sputum (likely from pneumonia), vitamin K given, will need rechecked.  Discussed with hospitalist for admission.  Blake Wise was evaluated in Emergency Department on 10/19/2019 for the symptoms described in the history of present illness. He was evaluated in the context of the global COVID-19 pandemic, which necessitated consideration that the patient might be at risk for infection with the SARS-CoV-2 virus that causes COVID-19. Institutional protocols and algorithms that pertain to the evaluation of patients at risk for COVID-19 are in a state of rapid change based on information released by regulatory bodies including the CDC and federal and state organizations. These policies and algorithms were followed during the patient's care in the ED.   Pertinent labs & imaging results that were available during my care of the patient were reviewed by me and considered in my medical decision making (see chart for details).  ____________________________________________  FINAL CLINICAL IMPRESSION(S) / ED DIAGNOSES  Final diagnoses:  Atrial fibrillation with rapid ventricular response ([place])  Community acquired pneumonia of right upper lobe of lung  Urinary retention  Enteritis  Supratherapeutic INR     MEDICATIONS GIVEN DURING THIS VISIT:  Medications  diltiazem (CARDIZEM) 1 mg/mL load via infusion 15 mg (15 mg Intravenous Bolus from Bag 10/19/19 0311)    And  diltiazem (CARDIZEM) 125 mg in dextrose 5% 125 mL (1 mg/mL) infusion (5 mg/hr Intravenous Rate/Dose Verify 10/19/19 0653)  atorvastatin (LIPITOR) tablet 10 mg (has no administration in time range)  metoprolol tartrate (LOPRESSOR) tablet 50 mg (has no administration  in time range)  sodium bicarbonate tablet 650 mg (has no administration in time range)  methocarbamol (ROBAXIN) tablet 500 mg (has no administration in time range)  sodium chloride flush (NS) 0.9 % injection 3 mL (has no administration in time range)  sodium chloride flush (NS) 0.9 % injection 3 mL (has no administration in time range)  0.9 %  sodium chloride infusion (has no administration in time range)  acetaminophen (TYLENOL) tablet 650 mg (has no administration in time range)    Or  acetaminophen (TYLENOL) suppository 650 mg (has no administration in time range)  polyethylene glycol (MIRALAX / GLYCOLAX) packet 17 g (has no administration in time range)  bisacodyl (DULCOLAX) suppository 10 mg (has no administration in time range)  ondansetron (ZOFRAN) tablet 4 mg (has no administration in time range)    Or  ondansetron (ZOFRAN) injection 4 mg (has no administration in time range)  insulin aspart (novoLOG) injection 0-9 Units (has no administration in time range)  azithromycin (ZITHROMAX) 500 mg in sodium chloride 0.9 % 250 mL IVPB (has no administration in time range)  piperacillin-tazobactam (ZOSYN) IVPB 3.375 g (3.375 g Intravenous New Bag/Given 10/19/19 0740)  piperacillin-tazobactam (ZOSYN) IVPB 3.375  g (has no administration in time range)  MEDLINE mouth rinse (has no administration in time range)  lactated ringers infusion ( Intravenous New Bag/Given 10/19/19 0741)  lactated ringers bolus 1,000 mL (0 mLs Intravenous Stopped 10/19/19 0056)  iohexol (OMNIPAQUE) 300 MG/ML solution 100 mL (100 mLs Intravenous Contrast Given 10/19/19 0206)  cefTRIAXone (ROCEPHIN) 1 g in sodium chloride 0.9 % 100 mL IVPB (0 g Intravenous Stopped 10/19/19 0215)  azithromycin (ZITHROMAX) 500 mg in sodium chloride 0.9 % 250 mL IVPB (0 mg Intravenous Stopped 10/19/19 0227)  phytonadione (VITAMIN K) 2.5 mg in dextrose 5 % 50 mL IVPB (0 mg Intravenous Stopped 10/19/19 0306)  lactated ringers bolus 1,000 mL (0 mLs  Intravenous Stopped 10/19/19 0431)  magnesium sulfate IVPB 2 g 50 mL (0 g Intravenous Stopped 10/19/19 0431)  metroNIDAZOLE (FLAGYL) IVPB 500 mg (0 mg Intravenous Stopped 10/19/19 0431)     NEW OUTPATIENT MEDICATIONS STARTED DURING THIS VISIT:  Current Discharge Medication List      Note:  This note was prepared with assistance of Dragon voice recognition software. Occasional wrong-word or sound-a-like substitutions may have occurred due to the inherent limitations of voice recognition software.   Daimen Shovlin, Corene Cornea, MD 10/19/19 [phone]

## 2019-10-19 ENCOUNTER — Inpatient Hospital Stay (HOSPITAL_COMMUNITY): Payer: Medicare HMO

## 2019-10-19 ENCOUNTER — Other Ambulatory Visit: Payer: Self-pay

## 2019-10-19 DIAGNOSIS — K529 Noninfective gastroenteritis and colitis, unspecified: Secondary | ICD-10-CM | POA: Diagnosis present

## 2019-10-19 DIAGNOSIS — K828 Other specified diseases of gallbladder: Secondary | ICD-10-CM | POA: Diagnosis present

## 2019-10-19 DIAGNOSIS — I4891 Unspecified atrial fibrillation: Secondary | ICD-10-CM

## 2019-10-19 DIAGNOSIS — K5730 Diverticulosis of large intestine without perforation or abscess without bleeding: Secondary | ICD-10-CM | POA: Diagnosis present

## 2019-10-19 DIAGNOSIS — Z87891 Personal history of nicotine dependence: Secondary | ICD-10-CM | POA: Diagnosis not present

## 2019-10-19 DIAGNOSIS — I10 Essential (primary) hypertension: Secondary | ICD-10-CM

## 2019-10-19 DIAGNOSIS — J189 Pneumonia, unspecified organism: Secondary | ICD-10-CM

## 2019-10-19 DIAGNOSIS — R791 Abnormal coagulation profile: Secondary | ICD-10-CM

## 2019-10-19 DIAGNOSIS — J449 Chronic obstructive pulmonary disease, unspecified: Secondary | ICD-10-CM | POA: Diagnosis present

## 2019-10-19 DIAGNOSIS — E119 Type 2 diabetes mellitus without complications: Secondary | ICD-10-CM

## 2019-10-19 DIAGNOSIS — Z9981 Dependence on supplemental oxygen: Secondary | ICD-10-CM | POA: Diagnosis not present

## 2019-10-19 DIAGNOSIS — Z20822 Contact with and (suspected) exposure to covid-19: Secondary | ICD-10-CM | POA: Diagnosis present

## 2019-10-19 DIAGNOSIS — A419 Sepsis, unspecified organism: Secondary | ICD-10-CM | POA: Diagnosis present

## 2019-10-19 DIAGNOSIS — I340 Nonrheumatic mitral (valve) insufficiency: Secondary | ICD-10-CM | POA: Diagnosis not present

## 2019-10-19 DIAGNOSIS — I5032 Chronic diastolic (congestive) heart failure: Secondary | ICD-10-CM | POA: Diagnosis present

## 2019-10-19 DIAGNOSIS — Z7901 Long term (current) use of anticoagulants: Secondary | ICD-10-CM | POA: Diagnosis not present

## 2019-10-19 DIAGNOSIS — R338 Other retention of urine: Secondary | ICD-10-CM

## 2019-10-19 DIAGNOSIS — D62 Acute posthemorrhagic anemia: Secondary | ICD-10-CM | POA: Diagnosis not present

## 2019-10-19 DIAGNOSIS — I361 Nonrheumatic tricuspid (valve) insufficiency: Secondary | ICD-10-CM | POA: Diagnosis not present

## 2019-10-19 DIAGNOSIS — I248 Other forms of acute ischemic heart disease: Secondary | ICD-10-CM | POA: Diagnosis present

## 2019-10-19 DIAGNOSIS — J9621 Acute and chronic respiratory failure with hypoxia: Secondary | ICD-10-CM | POA: Diagnosis present

## 2019-10-19 DIAGNOSIS — D509 Iron deficiency anemia, unspecified: Secondary | ICD-10-CM | POA: Diagnosis present

## 2019-10-19 DIAGNOSIS — E11649 Type 2 diabetes mellitus with hypoglycemia without coma: Secondary | ICD-10-CM | POA: Diagnosis not present

## 2019-10-19 DIAGNOSIS — E785 Hyperlipidemia, unspecified: Secondary | ICD-10-CM | POA: Diagnosis present

## 2019-10-19 DIAGNOSIS — I110 Hypertensive heart disease with heart failure: Secondary | ICD-10-CM | POA: Diagnosis present

## 2019-10-19 DIAGNOSIS — N3289 Other specified disorders of bladder: Secondary | ICD-10-CM | POA: Diagnosis present

## 2019-10-19 DIAGNOSIS — J690 Pneumonitis due to inhalation of food and vomit: Secondary | ICD-10-CM | POA: Diagnosis present

## 2019-10-19 DIAGNOSIS — K8020 Calculus of gallbladder without cholecystitis without obstruction: Secondary | ICD-10-CM | POA: Diagnosis present

## 2019-10-19 DIAGNOSIS — Z7951 Long term (current) use of inhaled steroids: Secondary | ICD-10-CM | POA: Diagnosis not present

## 2019-10-19 DIAGNOSIS — R079 Chest pain, unspecified: Secondary | ICD-10-CM

## 2019-10-19 DIAGNOSIS — I482 Chronic atrial fibrillation, unspecified: Secondary | ICD-10-CM | POA: Diagnosis present

## 2019-10-19 DIAGNOSIS — R0602 Shortness of breath: Secondary | ICD-10-CM | POA: Diagnosis present

## 2019-10-19 DIAGNOSIS — I452 Bifascicular block: Secondary | ICD-10-CM | POA: Diagnosis present

## 2019-10-19 LAB — COMPREHENSIVE METABOLIC PANEL
ALT: 43 U/L (ref 0–44)
AST: 55 U/L — ABNORMAL HIGH (ref 15–41)
Albumin: 3.5 g/dL (ref 3.5–5.0)
Alkaline Phosphatase: 76 U/L (ref 38–126)
Anion gap: 15 (ref 5–15)
BUN: 35 mg/dL — ABNORMAL HIGH (ref 8–23)
CO2: 29 mmol/L (ref 22–32)
Calcium: 8.6 mg/dL — ABNORMAL LOW (ref 8.9–10.3)
Chloride: 103 mmol/L (ref 98–111)
Creatinine, Ser: 1.61 mg/dL — ABNORMAL HIGH (ref 0.61–1.24)
GFR calc Af Amer: 48 mL/min — ABNORMAL LOW (ref >60)
GFR calc non Af Amer: 42 mL/min — ABNORMAL LOW (ref >60)
Glucose, Bld: 156 mg/dL — ABNORMAL HIGH (ref 70–99)
Potassium: 4.1 mmol/L (ref 3.5–5.1)
Sodium: 147 mmol/L — ABNORMAL HIGH (ref 135–145)
Total Bilirubin: 2.5 mg/dL — ABNORMAL HIGH (ref 0.3–1.2)
Total Protein: 7.2 g/dL (ref 6.5–8.1)

## 2019-10-19 LAB — GLUCOSE, CAPILLARY
Glucose-Capillary: 107 mg/dL — ABNORMAL HIGH (ref 70–99)
Glucose-Capillary: 117 mg/dL — ABNORMAL HIGH (ref 70–99)
Glucose-Capillary: 131 mg/dL — ABNORMAL HIGH (ref 70–99)
Glucose-Capillary: 88 mg/dL (ref 70–99)
Glucose-Capillary: 85 mg/dL (ref 70–99)

## 2019-10-19 LAB — LACTIC ACID, PLASMA
Lactic Acid, Venous: 2.4 mmol/L (ref 0.5–1.9)
Lactic Acid, Venous: 1.2 mmol/L (ref 0.5–1.9)
Lactic Acid, Venous: 1.4 mmol/L (ref 0.5–1.9)
Lactic Acid, Venous: 1.5 mmol/L (ref 0.5–1.9)

## 2019-10-19 LAB — URINALYSIS, ROUTINE W REFLEX MICROSCOPIC
Bacteria, UA: NONE SEEN
Bilirubin Urine: NEGATIVE
Glucose, UA: NEGATIVE mg/dL
Hgb urine dipstick: NEGATIVE
Ketones, ur: NEGATIVE mg/dL
Leukocytes,Ua: NEGATIVE
Nitrite: NEGATIVE
Protein, ur: 30 mg/dL — AB
Specific Gravity, Urine: 1.018 (ref 1.005–1.030)
pH: 6 (ref 5.0–8.0)

## 2019-10-19 LAB — PROTIME-INR
INR: 10.2 (ref 0.8–1.2)
Prothrombin Time: 81.3 seconds — ABNORMAL HIGH (ref 11.4–15.2)

## 2019-10-19 LAB — ECHOCARDIOGRAM COMPLETE
Height: 71 in
Weight: 1985.9 oz

## 2019-10-19 LAB — TROPONIN I (HIGH SENSITIVITY)
Troponin I (High Sensitivity): 30 ng/L — ABNORMAL HIGH (ref <18)
Troponin I (High Sensitivity): 32 ng/L — ABNORMAL HIGH (ref <18)
Troponin I (High Sensitivity): 43 ng/L — ABNORMAL HIGH (ref <18)
Troponin I (High Sensitivity): 44 ng/L — ABNORMAL HIGH (ref <18)

## 2019-10-19 LAB — RESPIRATORY PANEL BY RT PCR (FLU A&B, COVID)
Influenza A by PCR: NEGATIVE
Influenza B by PCR: NEGATIVE
SARS Coronavirus 2 by RT PCR: NEGATIVE

## 2019-10-19 LAB — LIPASE, BLOOD: Lipase: 14 U/L (ref 11–51)

## 2019-10-19 LAB — BRAIN NATRIURETIC PEPTIDE: B Natriuretic Peptide: 848 pg/mL — ABNORMAL HIGH (ref 0.0–100.0)

## 2019-10-19 LAB — MAGNESIUM: Magnesium: 2 mg/dL (ref 1.7–2.4)

## 2019-10-19 MED ORDER — DILTIAZEM HCL-DEXTROSE 125-5 MG/125ML-% IV SOLN (PREMIX)
5.0000 mg/h | INTRAVENOUS | Status: DC
  Administered 2019-10-19: 5 mg/h via INTRAVENOUS
  Administered 2019-10-19: 10 mg/h via INTRAVENOUS
  Filled 2019-10-19 (×2): qty 125

## 2019-10-19 MED ORDER — PIPERACILLIN-TAZOBACTAM 3.375 G IVPB 30 MIN
3.3750 g | Freq: Once | INTRAVENOUS | Status: AC
  Administered 2019-10-19: 08:00:00 3.375 g via INTRAVENOUS
  Filled 2019-10-19 (×2): qty 50

## 2019-10-19 MED ORDER — PIPERACILLIN-TAZOBACTAM 3.375 G IVPB
3.3750 g | Freq: Three times a day (TID) | INTRAVENOUS | Status: DC
  Administered 2019-10-19 – 2019-10-24 (×15): 3.375 g via INTRAVENOUS
  Filled 2019-10-19 (×15): qty 50

## 2019-10-19 MED ORDER — INSULIN ASPART 100 UNIT/ML ~~LOC~~ SOLN
0.0000 [IU] | Freq: Three times a day (TID) | SUBCUTANEOUS | Status: DC
  Administered 2019-10-19: 13:00:00 1 [IU] via SUBCUTANEOUS

## 2019-10-19 MED ORDER — VITAMIN K1 10 MG/ML IJ SOLN
2.5000 mg | Freq: Once | INTRAVENOUS | Status: AC
  Administered 2019-10-19: 2.5 mg via INTRAVENOUS
  Filled 2019-10-19: qty 0.25

## 2019-10-19 MED ORDER — LACTATED RINGERS IV SOLN: INTRAVENOUS | Status: DC

## 2019-10-19 MED ORDER — SODIUM CHLORIDE 0.9 % IV SOLN
500.0000 mg | Freq: Once | INTRAVENOUS | Status: AC
  Administered 2019-10-19: 500 mg via INTRAVENOUS
  Filled 2019-10-19: qty 500

## 2019-10-19 MED ORDER — ACETAMINOPHEN 650 MG RE SUPP: 650.0000 mg | Freq: Four times a day (QID) | RECTAL | Status: DC | PRN

## 2019-10-19 MED ORDER — SODIUM CHLORIDE 0.9% FLUSH
3.0000 mL | Freq: Two times a day (BID) | INTRAVENOUS | Status: DC
  Administered 2019-10-19 – 2019-10-28 (×15): 3 mL via INTRAVENOUS

## 2019-10-19 MED ORDER — ACETAMINOPHEN 325 MG PO TABS
650.0000 mg | ORAL_TABLET | Freq: Four times a day (QID) | ORAL | Status: DC | PRN
  Administered 2019-10-22 – 2019-10-23 (×2): 650 mg via ORAL
  Filled 2019-10-19 (×2): qty 2

## 2019-10-19 MED ORDER — ONDANSETRON HCL 4 MG/2ML IJ SOLN
4.0000 mg | Freq: Four times a day (QID) | INTRAMUSCULAR | Status: DC | PRN
  Administered 2019-10-24 – 2019-10-26 (×3): 4 mg via INTRAVENOUS
  Filled 2019-10-19 (×3): qty 2

## 2019-10-19 MED ORDER — METHOCARBAMOL 500 MG PO TABS
500.0000 mg | ORAL_TABLET | Freq: Four times a day (QID) | ORAL | Status: DC
  Administered 2019-10-19 – 2019-10-28 (×35): 500 mg via ORAL
  Filled 2019-10-19 (×36): qty 1

## 2019-10-19 MED ORDER — SODIUM CHLORIDE 0.9 % IV SOLN
500.0000 mg | INTRAVENOUS | Status: DC
  Administered 2019-10-20 – 2019-10-24 (×5): 500 mg via INTRAVENOUS
  Filled 2019-10-19 (×5): qty 500

## 2019-10-19 MED ORDER — SODIUM CHLORIDE 0.9 % IV SOLN: 250.0000 mL | INTRAVENOUS | Status: DC | PRN

## 2019-10-19 MED ORDER — LACTATED RINGERS IV BOLUS
1000.0000 mL | Freq: Once | INTRAVENOUS | Status: AC
  Administered 2019-10-19: 1000 mL via INTRAVENOUS

## 2019-10-19 MED ORDER — METRONIDAZOLE IN NACL 5-0.79 MG/ML-% IV SOLN
500.0000 mg | Freq: Once | INTRAVENOUS | Status: AC
  Administered 2019-10-19: 03:00:00 500 mg via INTRAVENOUS
  Filled 2019-10-19: qty 100

## 2019-10-19 MED ORDER — POLYETHYLENE GLYCOL 3350 17 G PO PACK: 17.0000 g | PACK | Freq: Every day | ORAL | Status: DC | PRN

## 2019-10-19 MED ORDER — ORAL CARE MOUTH RINSE
15.0000 mL | Freq: Two times a day (BID) | OROMUCOSAL | Status: DC
  Administered 2019-10-19 – 2019-10-28 (×19): 15 mL via OROMUCOSAL

## 2019-10-19 MED ORDER — VITAMIN K1 10 MG/ML IJ SOLN
INTRAMUSCULAR | Status: AC
  Filled 2019-10-19: qty 1

## 2019-10-19 MED ORDER — DILTIAZEM LOAD VIA INFUSION
15.0000 mg | Freq: Once | INTRAVENOUS | Status: AC
  Administered 2019-10-19: 15 mg via INTRAVENOUS
  Filled 2019-10-19: qty 15

## 2019-10-19 MED ORDER — SODIUM CHLORIDE 0.9 % IV SOLN
1.0000 g | Freq: Once | INTRAVENOUS | Status: AC
  Administered 2019-10-19: 1 g via INTRAVENOUS
  Filled 2019-10-19: qty 10

## 2019-10-19 MED ORDER — BISACODYL 10 MG RE SUPP: 10.0000 mg | Freq: Every day | RECTAL | Status: DC | PRN

## 2019-10-19 MED ORDER — MAGNESIUM SULFATE 2 GM/50ML IV SOLN
2.0000 g | Freq: Once | INTRAVENOUS | Status: AC
  Administered 2019-10-19: 03:00:00 2 g via INTRAVENOUS
  Filled 2019-10-19: qty 50

## 2019-10-19 MED ORDER — ONDANSETRON HCL 4 MG PO TABS
4.0000 mg | ORAL_TABLET | Freq: Four times a day (QID) | ORAL | Status: DC | PRN
  Filled 2019-10-19: qty 1

## 2019-10-19 MED ORDER — ATORVASTATIN CALCIUM 10 MG PO TABS
10.0000 mg | ORAL_TABLET | Freq: Every day | ORAL | Status: DC
  Administered 2019-10-19 – 2019-10-28 (×10): 10 mg via ORAL
  Filled 2019-10-19 (×10): qty 1

## 2019-10-19 MED ORDER — SODIUM CHLORIDE 0.9% FLUSH: 3.0000 mL | INTRAVENOUS | Status: DC | PRN

## 2019-10-19 MED ORDER — SODIUM BICARBONATE 650 MG PO TABS
650.0000 mg | ORAL_TABLET | Freq: Four times a day (QID) | ORAL | Status: DC
  Administered 2019-10-19 – 2019-10-28 (×39): 650 mg via ORAL
  Filled 2019-10-19 (×38): qty 1

## 2019-10-19 MED ORDER — CHLORHEXIDINE GLUCONATE CLOTH 2 % EX PADS
6.0000 | MEDICATED_PAD | Freq: Every day | CUTANEOUS | Status: DC
  Administered 2019-10-19 – 2019-10-28 (×10): 6 via TOPICAL

## 2019-10-19 MED ORDER — METOPROLOL TARTRATE 50 MG PO TABS
50.0000 mg | ORAL_TABLET | Freq: Two times a day (BID) | ORAL | Status: DC
  Administered 2019-10-19 – 2019-10-28 (×18): 50 mg via ORAL
  Filled 2019-10-19 (×19): qty 1

## 2019-10-19 NOTE — ED Notes (Signed)
Pt transported back to room from CT via stretcher by Vivien Rota, RN- Dr Dayna Barker to see pt

## 2019-10-19 NOTE — ED Notes (Signed)
Defib pads placed on pt.

## 2019-10-19 NOTE — ED Notes (Signed)
Date and time results received: 10/19/19 0014  Test: INR Critical Value: 10.2  Name of Provider Notified: [place]  Orders Received? Or Actions Taken?: n/a

## 2019-10-19 NOTE — Progress Notes (Signed)
PROGRESS NOTE    Blake Wise  BEE:[id number] DOB: 09-01-1946 DOA: 10/18/2019 PCP: Patient, No Pcp Per   Brief Narrative:  Per HPI: Blake Wise is a 74 y.o. male with medical history significant of atrial fibrillation, congestive heart failure, COPD, hypertension, hyperlipidemia, diabetes mellitus type 2 who presented to the ER with worsening shortness of breath, abdominal pain.  Patient is states he has been having left upper quadrant abdominal pain for the last 1 week.  He is also had multiple episodes of vomiting.  Patient reports progressively worsening shortness of breath, described as dyspnea on exertion.  He is also had a cough with bloody sputum over the last 2 days.  Today he had palpitations which persisted through the day.  No chest pain reported.  No fever, chills.  No hematemesis, melena, hematochezia, dysuria, diarrhea, syncope, seizures, dizziness, lightheadedness, focal deficits, blurring of vision.  1/3: Patient has been admitted with sepsis secondary to enteritis as well as possibly aspiration pneumonia versus community-acquired pneumonia.  He has associated atrial fibrillation with RVR along with supratherapeutic INR he has been given some vitamin K.  His heart rates are currently improving on Cardizem drip, but he is noted to have multiple PVCs on telemetry.  He denies any chest pain, palpitations, or shortness of breath and is currently on 3 L nasal cannula.  Assessment & Plan:   Principal Problem:   Atrial fibrillation with RVR (HCC) Active Problems:   Chronic diastolic CHF (congestive heart failure) (HCC)   Chest pain   DM2 (diabetes mellitus, type 2) (HCC)   HTN (hypertension)   Acute urinary retention   Elevated INR   Community acquired pneumonia   Enteritis   Sepsis-multifactorial with enteritis as well as right-sided pneumonia with concern for aspiration -Continue on Zosyn and azithromycin as ordered -Monitor lactic acid in a.m. -Start IV  fluid-gentle  Atrial fibrillation with RVR secondary to above -Continue on Cardizem drip and wean as tolerated -Continue on home metoprolol -Should begin to improve sepsis physiology improves -2D echo pending -Patient currently with supratherapeutic INR  Mild acute on chronic hypoxemic respiratory failure -Currently on 3 L and baseline 2 L at home -Wean as tolerated  Supratherapeutic INR -Appreciate pharmacy management -Recheck INR in a.m. after vitamin K given  Troponin elevation likely secondary to demand ischemia -No chest pain or findings of ACS on EKG  Chronic congestive heart failure -No volume overload currently noted -Follow daily weights -Repeat 2D echocardiogram ordered -Lasix on hold due to sepsis  Hypertension-stable -Continue metoprolol as well as Cardizem drip and monitor carefully  Type 2 diabetes -A1c pending -Hold Januvia for now  Chronic COPD on home 2 L nasal cannula oxygen -No acute exacerbation currently noted -We will switch to Xopenex as needed for shortness of breath or wheezing   DVT prophylaxis: Coumadin with supratherapeutic INR Code Status: Full Family Communication: None at bedside Disposition Plan: Continue on Cardizem drip for heart rate control and wean to off as tolerated.  Continue on antibiotics for sepsis and monitor cultures.   Consultants:   None  Procedures:   None  Antimicrobials:  Anti-infectives (From admission, onward)   Start     Dose/Rate Route Frequency Ordered Stop   10/20/19 0200  azithromycin (ZITHROMAX) 500 mg in sodium chloride 0.9 % 250 mL IVPB     500 mg 250 mL/hr over 60 Minutes Intravenous Every 24 hours 10/19/19 0622     10/19/19 1400  piperacillin-tazobactam (ZOSYN) IVPB 3.375 g     3.375  g 12.5 mL/hr over 240 Minutes Intravenous Every 8 hours 10/19/19 0635     10/19/19 0700  piperacillin-tazobactam (ZOSYN) IVPB 3.375 g     3.375 g 100 mL/hr over 30 Minutes Intravenous  Once 10/19/19 0635      10/19/19 0315  metroNIDAZOLE (FLAGYL) IVPB 500 mg     500 mg 100 mL/hr over 60 Minutes Intravenous  Once 10/19/19 0314 10/19/19 0431   10/19/19 0100  cefTRIAXone (ROCEPHIN) 1 g in sodium chloride 0.9 % 100 mL IVPB     1 g 200 mL/hr over 30 Minutes Intravenous  Once 10/19/19 0053 10/19/19 0215   10/19/19 0100  azithromycin (ZITHROMAX) 500 mg in sodium chloride 0.9 % 250 mL IVPB     500 mg 250 mL/hr over 60 Minutes Intravenous  Once 10/19/19 0053 10/19/19 0227       Subjective: Patient seen and evaluated today with no new acute complaints or concerns. No acute concerns or events noted overnight.  He denies any chest pain or palpitations or shortness of breath and is currently on 3 L nasal cannula.  His heart rates are slowly improving.  Objective: Vitals:   10/19/19 0603 10/19/19 0604 10/19/19 0615 10/19/19 0630  BP: 115/77  (!) 81/71 (!) 102/53  Pulse: 84 86 78 (!) 107  Resp:  20 14 20   Temp:      TempSrc:      SpO2: (!) 83% (!) 85% 98% 92%  Weight:      Height:        Intake/Output Summary (Last 24 hours) at 10/19/2019 0650 Last data filed at 10/19/2019 0600 Gross per 24 hour  Intake 289.43 ml  Output 1000 ml  Net -710.57 ml   Filed Weights   10/18/19 2253 10/19/19 0600  Weight: 55.8 kg 56.3 kg    Examination:  General exam: Appears calm and comfortable  Respiratory system: Clear to auscultation. Respiratory effort normal.  Currently on 3 L nasal cannula oxygen. Cardiovascular system: S1 & S2 heard, irregular and tachycardic. No JVD, murmurs, rubs, gallops or clicks. No pedal edema. Gastrointestinal system: Abdomen is nondistended, soft and nontender. No organomegaly or masses felt. Normal bowel sounds heard. Central nervous system: Alert and oriented. No focal neurological deficits. Extremities: Symmetric 5 x 5 power. Skin: No rashes, lesions or ulcers Psychiatry: Judgement and insight appear normal. Mood & affect appropriate.     Data Reviewed: I have personally  reviewed following labs and imaging studies  CBC: Recent Labs  Lab 10/18/19 2318  WBC 15.3*  NEUTROABS 13.3*  HGB 11.0*  HCT 34.3*  MCV 96.9  PLT 585   Basic Metabolic Panel: Recent Labs  Lab 10/18/19 2355  NA 147*  K 4.1  CL 103  CO2 29  GLUCOSE 156*  BUN 35*  CREATININE 1.61*  CALCIUM 8.6*  MG 2.0   GFR: Estimated Creatinine Clearance: 32.1 mL/min (A) (by C-G formula based on SCr of 1.61 mg/dL (H)). Liver Function Tests: Recent Labs  Lab 10/18/19 2355  AST 55*  ALT 43  ALKPHOS 76  BILITOT 2.5*  PROT 7.2  ALBUMIN 3.5   Recent Labs  Lab 10/18/19 2355  LIPASE 14   No results for input(s): AMMONIA in the last 168 hours. Coagulation Profile: Recent Labs  Lab 10/18/19 2319  INR 10.2*   Cardiac Enzymes: No results for input(s): CKTOTAL, CKMB, CKMBINDEX, TROPONINI in the last 168 hours. BNP (last 3 results) No results for input(s): PROBNP in the last 8760 hours. HbA1C: No results for  input(s): HGBA1C in the last 72 hours. CBG: Recent Labs  Lab 10/19/19 0634  GLUCAP 107*   Lipid Profile: No results for input(s): CHOL, HDL, LDLCALC, TRIG, CHOLHDL, LDLDIRECT in the last 72 hours. Thyroid Function Tests: No results for input(s): TSH, T4TOTAL, FREET4, T3FREE, THYROIDAB in the last 72 hours. Anemia Panel: No results for input(s): VITAMINB12, FOLATE, FERRITIN, TIBC, IRON, RETICCTPCT in the last 72 hours. Sepsis Labs: Recent Labs  Lab 10/19/19 0938 10/19/19 0519  LATICACIDVEN 2.4* 1.2    Recent Results (from the past 240 hour(s))  Respiratory Panel by RT PCR (Flu A&B, Covid) - Nasopharyngeal Swab     Status: None   Collection Time: 10/18/19 11:18 PM   Specimen: Nasopharyngeal Swab  Result Value Ref Range Status   SARS Coronavirus 2 by RT PCR NEGATIVE NEGATIVE Final    Comment: (NOTE) SARS-CoV-2 target nucleic acids are NOT DETECTED. The SARS-CoV-2 RNA is generally detectable in upper respiratoy specimens during the acute phase of infection. The  lowest concentration of SARS-CoV-2 viral copies this assay can detect is 131 copies/mL. A negative result does not preclude SARS-Cov-2 infection and should not be used as the sole basis for treatment or other patient management decisions. A negative result may occur with  improper specimen collection/handling, submission of specimen other than nasopharyngeal swab, presence of viral mutation(s) within the areas targeted by this assay, and inadequate number of viral copies (<131 copies/mL). A negative result must be combined with clinical observations, patient history, and epidemiological information. The expected result is Negative. Fact Sheet for Patients:  PinkCheek.be Fact Sheet for Healthcare Providers:  GravelBags.it This test is not yet ap proved or cleared by the [place] FDA and  has been authorized for detection and/or diagnosis of SARS-CoV-2 by FDA under an Emergency Use Authorization (EUA). This EUA will remain  in effect (meaning this test can be used) for the duration of the COVID-19 declaration under Section 564(b)(1) of the Act, 21 U.S.C. section 360bbb-3(b)(1), unless the authorization is terminated or revoked sooner.    Influenza A by PCR NEGATIVE NEGATIVE Final   Influenza B by PCR NEGATIVE NEGATIVE Final    Comment: (NOTE) The Xpert Xpress SARS-CoV-2/FLU/RSV assay is intended as an aid in  the diagnosis of influenza from Nasopharyngeal swab specimens and  should not be used as a sole basis for treatment. Nasal washings and  aspirates are unacceptable for Xpert Xpress SARS-CoV-2/FLU/RSV  testing. Fact Sheet for Patients: PinkCheek.be Fact Sheet for Healthcare Providers: GravelBags.it This test is not yet approved or cleared by the [place] FDA and  has been authorized for detection and/or diagnosis of SARS-CoV-2 by  FDA under an Emergency  Use Authorization (EUA). This EUA will remain  in effect (meaning this test can be used) for the duration of the  Covid-19 declaration under Section 564(b)(1) of the Act, 21  U.S.C. section 360bbb-3(b)(1), unless the authorization is  terminated or revoked. Performed at [facility], [address] [place], [place] [address]   Blood culture (routine x 2)     Status: None (Preliminary result)   Collection Time: 10/19/19  3:12 AM   Specimen: BLOOD LEFT ARM  Result Value Ref Range Status   Specimen Description BLOOD LEFT ARM  Final   Special Requests   Final    BOTTLES DRAWN AEROBIC ONLY Blood Culture adequate volume Performed at [facility], [address] [place], [place] [address]    Culture PENDING  Incomplete   Report Status PENDING  Incomplete  Radiology Studies: CT ABDOMEN PELVIS W CONTRAST  Result Date: 10/19/2019 CLINICAL DATA:  Abdominal pain. Nausea and vomiting. Abdominal abscess/infection suspected EXAM: CT ABDOMEN AND PELVIS WITH CONTRAST TECHNIQUE: Multidetector CT imaging of the abdomen and pelvis was performed using the standard protocol following bolus administration of intravenous contrast. CONTRAST:  147mL OMNIPAQUE IOHEXOL 300 MG/ML  SOLN COMPARISON:  Chest radiograph yesterday. Report from abdominopelvic CT 12/24/2015, images not available FINDINGS: Lower chest: Emphysema. Ground-glass opacities in the right middle lobe are partially included. Multi chamber cardiomegaly with primarily right heart dilatation. Contrast refluxes into the hepatic veins and IVC. Hepatobiliary: Stones and sludge in the gallbladder without gallbladder inflammation. No biliary dilatation. Tiny subcentimeter hypodensity in the subcapsular right lobe of the liver is too small to characterize. Contrast refluxes into the hepatic veins and IVC. Pancreas: No ductal dilatation or inflammation. Spleen: Heterogeneous enhancement likely due to phase of contrast. Spleen is normal in size.  Adrenals/Urinary Tract: Normal adrenal glands. No hydronephrosis. No perinephric edema. Small cyst in the posterior left kidney. Urinary bladder is markedly distended to the umbilicus. Bladder volume = 1500 cm^3. Bladder slightly protrudes into the right obturator fossa. No bladder wall thickening. Questionable diverticulum at the bladder dome. Stomach/Bowel: Fluid-filled stomach, with minimal fluid in the distal esophagus. There is no gastric wall thickening. Wall thickening with mucosal enhancement involving the fourth portion of the duodenum, and more prominently the proximal jejunum. Moderate length segment of small bowel involvement. No obstruction. There is associated mesenteric edema and free fluid. No pneumatosis or perforation. More distal small bowel are nondistended, slightly fluid-filled but noninflamed. There is small bowel interposition lateral to the right lobe of the liver. Colonic diverticulosis without diverticulitis. The appendix is not well visualized. There is no evidence of appendicitis. Vascular/Lymphatic: Moderate aortic atherosclerosis. No aortic aneurysm. Portal vein is patent. The mesenteric artery and veins are patent. No gross adenopathy. Reproductive: Prostate is unremarkable. Other: Small to moderate volume of free fluid/ascites. Moderate mesenteric edema and free fluid. No free air. No intra-abdominal abscess. Musculoskeletal: Degenerative change in the spine. Avascular necrosis of the femoral heads without collapse. IMPRESSION: 1. Mark wall thickening with mucosal enhancement involving the fourth portion of the duodenum and proximal jejunum, with associated mesenteric edema and free fluid. Findings may represent infectious or inflammatory enteritis versus bowel angioedema. No evidence of bowel ischemia or perforation. 2. Markedly distended urinary bladder to the umbilicus. Questionable bladder dome diverticulum. Similar findings were described on prior exam, this may be chronic. 3.  Gallstones and sludge in the gallbladder without gallbladder inflammation. 4. Colonic diverticulosis without diverticulitis. 5. Multi chamber cardiomegaly with primarily right heart dilatation, and contrast refluxing into the hepatic veins and IVC consistent with right heart failure. Aortic Atherosclerosis (ICD10-I70.0) and Emphysema (ICD10-J43.9). Electronically Signed   By: Keith Rake M.D.   On: 10/19/2019 03:01   DG Chest Portable 1 View  Result Date: 10/19/2019 CLINICAL DATA:  Shortness of breath. Nausea and vomiting. EXAM: PORTABLE CHEST 1 VIEW COMPARISON:  Radiograph 07/10/2018. FINDINGS: Patchy heterogeneous airspace opacities throughout the right right upper and midlung zones. Possible minimal fluid in the right minor fissure. No definite airspace disease in the left lung. Mild cardiomegaly is unchanged. Mild aortic atherosclerosis. No pneumothorax. Chronic hyperinflation. No acute osseous abnormalities are seen. IMPRESSION: 1. Patchy heterogeneous airspace opacities in the right upper and midlung zones, suspicious for pneumonia. Unilateral involvement more typical of broncho pneumonia rather than COVID. 2. Stable mild cardiomegaly and hyperinflation. Electronically Signed   By: Keith Rake  M.D.   On: 10/19/2019 00:23        Scheduled Meds: . atorvastatin  10 mg Oral Daily  . insulin aspart  0-9 Units Subcutaneous TID WC  . mouth rinse  15 mL Mouth Rinse BID  . methocarbamol  500 mg Oral QID  . metoprolol tartrate  50 mg Oral BID  . sodium bicarbonate  650 mg Oral QID  . sodium chloride flush  3 mL Intravenous Q12H   Continuous Infusions: . sodium chloride    . [START ON 10/20/2019] azithromycin    . diltiazem (CARDIZEM) infusion 5 mg/hr (10/19/19 0526)  . lactated ringers    . piperacillin-tazobactam    . piperacillin-tazobactam (ZOSYN)  IV       LOS: 0 days    Time spent: 30 minutes    Felishia Wartman Darleen Crocker, DO [facility] Pager [phone]  If 7PM-7AM,  please contact night-coverage [email or web address] Password [email or web address] 10/19/2019, 6:50 AM

## 2019-10-19 NOTE — H&P (Signed)
History and Physical    Patient Demographics:    Blake Wise NOB:[id number] DOB: 12-06-1945 DOA: 10/18/2019  PCP: Patient, No Pcp Per  Patient coming from: Home  I have personally briefly reviewed patient's old medical records in [place]  Chief Complaint: Abdominal pain, shortness of breath   Assessment & Plan:     Assessment/Plan Principal Problem:   Atrial fibrillation with RVR ([place]) Active Problems:   Chronic diastolic CHF (congestive heart failure) (HCC)   Chest pain   DM2 (diabetes mellitus, type 2) ([place])   HTN (hypertension)   Acute urinary retention   Elevated INR   Community acquired pneumonia   Enteritis     Principal Problem: Atrial Fibrillation with RVR Has a history of chronic A. fib on Coumadin.  Patient noted to have heart rate in the 160s to 180s on arrival.  Placed on Cardizem drip in the ER. -Stepdown unit with telemetry monitoring -Serial troponins -Continue Cardizem drip with titration -Echocardiogram -Hold Coumadin for now due to supratherapeutic INR  Other Active Problems: Sepsis possibly secondary to enteritis: Patient presented with abdominal pain, vomiting.  CT of the abdomen shows enteritis with inflammation around the fourth portion of the duodenum and proximal jejunum associated with mesenteric edema.  Etiology possibly infectious.  Patient noted to have low-grade fever, leukocytosis, elevated lactic acid. -We will place on broad-spectrum IV antibiotics -Monitor CBC, temp  Right sided Pneumonia, concern for community-acquired versus aspiration: Patient had increased shortness of breath, cough, 2-day history of hemoptysis.  As noted above, has had abdominal pain and vomiting possibly secondary to enteritis.  Concern for aspiration versus community-acquired pneumonia.  Influenza and SARS-CoV-2 RT-PCR negative.  Hemoptysis is likely secondary to supratherapeutic INR in the setting of Coumadin use. -IV antibiotics as above -We will  send sputum culture  Mild acute on chronic hypoxemic respiratory failure: Patient is on 4 L nasal cannula on presentation.  Baseline is 2 L at home. Continue oxygen with titration as needed.  Supratherapeutic INR: INR is 10.2 on arrival.  Patient is on Coumadin at home.  2.5 mg of vitamin K was given in the ER.  We will continue to monitor INR closely.  No evidence of bleeding currently.  Mild elevation of troponin: -Likely secondary to demand ischemia, type II NSTEMI.   -We will continue telemetry monitoring and serial troponins.  Chronic congestive heart failure: Patient is on Lasix at home.  Has a diagnosis of congestive heart failure as per outpatient records from [facility].  Did have an echocardiogram in 2017 but unfortunately results are not available in care everywhere.  We will keep Lasix on hold for now due to presentation with sepsis.  Repeat echocardiogram has been ordered.  Consider cardiology consult.  Hypertension: -Hold oral Cardizem for now.  We will continue metoprolol as tolerated.  Hyperlipidemia: -Continue Lipitor  Diabetes mellitus type 2: -We will place on sliding scale insulin coverage with fingerstick monitoring.   -Hold Januvia for now.   -We will send A1c for a.m.  Chronic COPD: Has a prior history of chronic smoking, quit 3 years ago.  Is on home oxygen at 2 L.  Currently does not seem to be in acute exacerbation. He uses inhalers, nebulizers at home. We will place on albuterol as needed for now.  Resume other medications once home med rec has been completed.  DVT prophylaxis: Coumadin Code Status:  Full code Family Communication: N/A  Disposition Plan: Admitted as inpatient for A. fib with RVR, pneumonia,  possible enteritis Consults called: N/A Admission status: Inpatient status    HPI:     HPI: Blake Wise is a 74 y.o. male with medical history significant of atrial fibrillation, congestive heart failure, COPD, hypertension,  hyperlipidemia, diabetes mellitus type 2 who presented to the ER with worsening shortness of breath, abdominal pain.  Patient is states he has been having left upper quadrant abdominal pain for the last 1 week.  He is also had multiple episodes of vomiting.  Patient reports progressively worsening shortness of breath, described as dyspnea on exertion.  He is also had a cough with bloody sputum over the last 2 days.  Today he had palpitations which persisted through the day.  No chest pain reported.  No fever, chills.  No hematemesis, melena, hematochezia, dysuria, diarrhea, syncope, seizures, dizziness, lightheadedness, focal deficits, blurring of vision. ED Course:  Vital Signs reviewed on presentation, significant for temperature 100.3, heart rate 135, blood pressure 135/92, saturation 100% on 4 L nasal cannula. Labs reviewed, significant for sodium 147, potassium 4.1, glucose 156, BUN 35, creatinine 1.6, AST 55, ALT 43, total bilirubin is 2.5, SARS Covid RT-PCR and flu PCR negative.  BNP 848, troponin 30, lactic acid 2.4, WBC count 15.3, hemoglobin 11.0, hematocrit 34, platelets 349, INR 10.2, urinalysis is negative.  Blood cultures have been sent. Imaging personally Reviewed, CT of the abdomen pelvis with contrast shows marked wall thickening with mucosal enhancement involving the fourth portion of the duodenum and proximal jejunum with associated mesenteric edema and free fluid.  Findings may represent infectious or inflammatory enteritis versus bowel angioedema.  No evidence of bowel ischemia or perforation.  Marked distention of urinary bladder to the umbilicus.  Questionable bladder dome diverticulum.  Gallstones and sludge without gallbladder inflammation.  Colonic diverticulosis without diverticulitis.  Multichamber cardiomegaly with primary right heart dilatation consistent with right heart failure. EKG personally reviewed, shows A. fib with RVR, PVCs, incomplete right bundle branch block and left  anterior fascicular block.    Review of systems:    Review of Systems: As per HPI otherwise 10 point review of systems negative.  All other review of systems is negative except the ones noted above in the HPI.    Past Medical and Surgical History:  Reviewed by me  Past Medical History:  Diagnosis Date  . A-fib ([place])   . CHF (congestive heart failure) ([place])   . COPD (chronic obstructive pulmonary disease) ([place])   . Diabetes mellitus without complication ([place])   . Hypertension     History reviewed. No pertinent surgical history.   Social History:  Reviewed by me   reports that he has quit smoking. He has never used smokeless tobacco. He reports that he does not use drugs. No history on file for alcohol.  Allergies:    Not on File  Family History :   No family history on file. Family history reviewed, noted as above, not pertinent to current presentation.   Home Medications:    Prior to Admission medications   Not on File    Physical Exam:    Physical Exam: Vitals:   10/19/19 0330 10/19/19 0335 10/19/19 0340 10/19/19 0345  BP: 101/77 103/72 113/63 105/68  Pulse:      Resp: 19 16 17 16   Temp:      TempSrc:      SpO2:      Weight:      Height:        Constitutional: NAD, calm, comfortable Vitals:  10/19/19 0330 10/19/19 0335 10/19/19 0340 10/19/19 0345  BP: 101/77 103/72 113/63 105/68  Pulse:      Resp: 19 16 17 16   Temp:      TempSrc:      SpO2:      Weight:      Height:       Eyes: PERRL, lids and conjunctivae normal ENMT: Mucous membranes are moist. Posterior pharynx clear of any exudate or lesions.Normal dentition.  Neck: normal, supple, no masses, no thyromegaly Respiratory: clear to auscultation bilaterally, no wheezing, right sided crepitations. Normal respiratory effort. No accessory muscle use.  Cardiovascular: irregular rhythm, tachycardia, no murmurs / rubs / gallops. No extremity edema. 2+ pedal pulses. No carotid bruits.  Abdomen:  left sided abdominal tenderness, mild guarding, no masses palpated. No hepatosplenomegaly. Bowel sounds positive.  Musculoskeletal: no clubbing / cyanosis. No joint deformity upper and lower extremities. Good ROM, no contractures. Normal muscle tone.  Skin: no rashes, lesions, ulcers. No induration Neurologic: CN 2-12 grossly intact. Sensation intact, DTR normal. Strength 5/5 in all 4.  Psychiatric: Normal judgment and insight. Alert and oriented x 3. Normal mood.    Decubitus Ulcers: Not present on admission Catheters and tubes: None  Data Review:    Labs on Admission: I have personally reviewed following labs and imaging studies  CBC: Recent Labs  Lab 10/18/19 2318  WBC 15.3*  NEUTROABS 13.3*  HGB 11.0*  HCT 34.3*  MCV 96.9  PLT 300   Basic Metabolic Panel: Recent Labs  Lab 10/18/19 2355  NA 147*  K 4.1  CL 103  CO2 29  GLUCOSE 156*  BUN 35*  CREATININE 1.61*  CALCIUM 8.6*  MG 2.0   GFR: Estimated Creatinine Clearance: 31.8 mL/min (A) (by C-G formula based on SCr of 1.61 mg/dL (H)). Liver Function Tests: Recent Labs  Lab 10/18/19 2355  AST 55*  ALT 43  ALKPHOS 76  BILITOT 2.5*  PROT 7.2  ALBUMIN 3.5   Recent Labs  Lab 10/18/19 2355  LIPASE 14   No results for input(s): AMMONIA in the last 168 hours. Coagulation Profile: Recent Labs  Lab 10/18/19 2319  INR 10.2*   Cardiac Enzymes: No results for input(s): CKTOTAL, CKMB, CKMBINDEX, TROPONINI in the last 168 hours. BNP (last 3 results) No results for input(s): PROBNP in the last 8760 hours. HbA1C: No results for input(s): HGBA1C in the last 72 hours. CBG: No results for input(s): GLUCAP in the last 168 hours. Lipid Profile: No results for input(s): CHOL, HDL, LDLCALC, TRIG, CHOLHDL, LDLDIRECT in the last 72 hours. Thyroid Function Tests: No results for input(s): TSH, T4TOTAL, FREET4, T3FREE, THYROIDAB in the last 72 hours. Anemia Panel: No results for input(s): VITAMINB12, FOLATE, FERRITIN,  TIBC, IRON, RETICCTPCT in the last 72 hours. Urine analysis:    Component Value Date/Time   COLORURINE YELLOW 10/19/2019 0259   APPEARANCEUR CLEAR 10/19/2019 0259   LABSPEC 1.018 10/19/2019 0259   PHURINE 6.0 10/19/2019 0259   GLUCOSEU NEGATIVE 10/19/2019 0259   HGBUR NEGATIVE 10/19/2019 0259   BILIRUBINUR NEGATIVE 10/19/2019 0259   KETONESUR NEGATIVE 10/19/2019 0259   PROTEINUR 30 (A) 10/19/2019 0259   NITRITE NEGATIVE 10/19/2019 0259   LEUKOCYTESUR NEGATIVE 10/19/2019 0259     Imaging Results:      Radiological Exams on Admission: CT ABDOMEN PELVIS W CONTRAST  Result Date: 10/19/2019 CLINICAL DATA:  Abdominal pain. Nausea and vomiting. Abdominal abscess/infection suspected EXAM: CT ABDOMEN AND PELVIS WITH CONTRAST TECHNIQUE: Multidetector CT imaging of the abdomen and  pelvis was performed using the standard protocol following bolus administration of intravenous contrast. CONTRAST:  134mL OMNIPAQUE IOHEXOL 300 MG/ML  SOLN COMPARISON:  Chest radiograph yesterday. Report from abdominopelvic CT 12/24/2015, images not available FINDINGS: Lower chest: Emphysema. Ground-glass opacities in the right middle lobe are partially included. Multi chamber cardiomegaly with primarily right heart dilatation. Contrast refluxes into the hepatic veins and IVC. Hepatobiliary: Stones and sludge in the gallbladder without gallbladder inflammation. No biliary dilatation. Tiny subcentimeter hypodensity in the subcapsular right lobe of the liver is too small to characterize. Contrast refluxes into the hepatic veins and IVC. Pancreas: No ductal dilatation or inflammation. Spleen: Heterogeneous enhancement likely due to phase of contrast. Spleen is normal in size. Adrenals/Urinary Tract: Normal adrenal glands. No hydronephrosis. No perinephric edema. Small cyst in the posterior left kidney. Urinary bladder is markedly distended to the umbilicus. Bladder volume = 1500 cm^3. Bladder slightly protrudes into the right  obturator fossa. No bladder wall thickening. Questionable diverticulum at the bladder dome. Stomach/Bowel: Fluid-filled stomach, with minimal fluid in the distal esophagus. There is no gastric wall thickening. Wall thickening with mucosal enhancement involving the fourth portion of the duodenum, and more prominently the proximal jejunum. Moderate length segment of small bowel involvement. No obstruction. There is associated mesenteric edema and free fluid. No pneumatosis or perforation. More distal small bowel are nondistended, slightly fluid-filled but noninflamed. There is small bowel interposition lateral to the right lobe of the liver. Colonic diverticulosis without diverticulitis. The appendix is not well visualized. There is no evidence of appendicitis. Vascular/Lymphatic: Moderate aortic atherosclerosis. No aortic aneurysm. Portal vein is patent. The mesenteric artery and veins are patent. No gross adenopathy. Reproductive: Prostate is unremarkable. Other: Small to moderate volume of free fluid/ascites. Moderate mesenteric edema and free fluid. No free air. No intra-abdominal abscess. Musculoskeletal: Degenerative change in the spine. Avascular necrosis of the femoral heads without collapse. IMPRESSION: 1. Mark wall thickening with mucosal enhancement involving the fourth portion of the duodenum and proximal jejunum, with associated mesenteric edema and free fluid. Findings may represent infectious or inflammatory enteritis versus bowel angioedema. No evidence of bowel ischemia or perforation. 2. Markedly distended urinary bladder to the umbilicus. Questionable bladder dome diverticulum. Similar findings were described on prior exam, this may be chronic. 3. Gallstones and sludge in the gallbladder without gallbladder inflammation. 4. Colonic diverticulosis without diverticulitis. 5. Multi chamber cardiomegaly with primarily right heart dilatation, and contrast refluxing into the hepatic veins and IVC  consistent with right heart failure. Aortic Atherosclerosis (ICD10-I70.0) and Emphysema (ICD10-J43.9). Electronically Signed   By: Keith Rake M.D.   On: 10/19/2019 03:01   DG Chest Portable 1 View  Result Date: 10/19/2019 CLINICAL DATA:  Shortness of breath. Nausea and vomiting. EXAM: PORTABLE CHEST 1 VIEW COMPARISON:  Radiograph 07/10/2018. FINDINGS: Patchy heterogeneous airspace opacities throughout the right right upper and midlung zones. Possible minimal fluid in the right minor fissure. No definite airspace disease in the left lung. Mild cardiomegaly is unchanged. Mild aortic atherosclerosis. No pneumothorax. Chronic hyperinflation. No acute osseous abnormalities are seen. IMPRESSION: 1. Patchy heterogeneous airspace opacities in the right upper and midlung zones, suspicious for pneumonia. Unilateral involvement more typical of broncho pneumonia rather than COVID. 2. Stable mild cardiomegaly and hyperinflation. Electronically Signed   By: Keith Rake M.D.   On: 10/19/2019 00:23      [facility] Ginette Otto MD [facility]  If 7PM-7AM, please contact night-coverage   10/19/2019, 3:52 AM

## 2019-10-19 NOTE — ED Notes (Signed)
Pt transported to CT at this time.

## 2019-10-19 NOTE — Progress Notes (Signed)
Attempted echo.  HR elevated well above 130 bpm +.  Let nurse know that will attempt once HR normalizes.

## 2019-10-19 NOTE — Progress Notes (Signed)
  Echocardiogram 2D Echocardiogram has been performed.  Blake Wise 10/19/2019, 12:38 PM

## 2019-10-19 NOTE — Progress Notes (Addendum)
Pharmacy Antibiotic Note  Blake Wise is a 74 y.o. male admitted on 10/18/2019 with intra-abdominal infection.  Pharmacy has been consulted for Zosyn dosing.  Plan: Give Zosyn 3.375g x1 dose over 30 minutes Start Zosyn 3.375g IV q8h (4-hr infusion) at 1400 today Pharmacy to monitor cultures, labs and patient progress.  Height: 5\' 11"  (180.3 cm) Weight: 124 lb 1.9 oz (56.3 kg) IBW/kg (Calculated) : 75.3  Temp (24hrs), Avg:99.2 F (37.3 C), Min:98.4 F (36.9 C), Max:100.3 F (37.9 C)  Recent Labs  Lab 10/18/19 2318 10/18/19 2355 10/19/19 0312  WBC 15.3*  --   --   CREATININE  --  1.61*  --   LATICACIDVEN  --   --  2.4*    Estimated Creatinine Clearance: 32.1 mL/min (A) (by C-G formula based on SCr of 1.61 mg/dL (H)).    No Known Allergies  Antimicrobials this admission: Zosyn 1/3>>   azithromycin 1/3 >> ceftriaxone 1/3 >> x1 dose metronidazole 1/3>>x1 dose   Microbiology results: 1/3 BC x2:   1/3 Resp PCR: negative for SARS Co-V-2 and Flu A/B 1/3MRSA PCR:   Thank you for allowing pharmacy to be a part of this patient's care.  Despina Pole 10/19/2019 6:36 AM

## 2019-10-19 NOTE — Progress Notes (Addendum)
ANTICOAGULATION CONSULT NOTE - Initial Up Consult   Pharmacy Consult for warfarin dosing  Indication: atrial fibrillation   No Known Allergies    Patient Measurements: Last Weight  Most recent update: 10/19/2019  6:05 AM   Weight  56.3 kg (124 lb 1.9 oz)           Body mass index is 17.31 kg/m. [facility]               Temp: 98.3 F (36.8 C) (01/03 0713) Temp Source: Oral (01/03 0713) BP: 95/51 (01/03 0700) Pulse Rate: 89 (01/03 0713)  Labs: Recent Labs    10/18/19 2318 10/18/19 2319 10/18/19 2355  HGB 11.0*  --   --   HCT 34.3*  --   --   PLT 349  --   --   LABPROT  --  81.3*  --   INR  --  10.2*  --   CREATININE  --   --  1.61*    Estimated Creatinine Clearance: 32.1 mL/min (A) (by C-G formula based on SCr of 1.61 mg/dL (H)).     Medications:  Medications Prior to Admission  Medication Sig Dispense Refill Last Dose  . atorvastatin (LIPITOR) 10 MG tablet Take 10 mg by mouth daily.   Past Week at Unknown time  . diltiazem (TIAZAC) 240 MG 24 hr capsule Take 240 mg by mouth daily.   Past Week at Unknown time  . furosemide (LASIX) 20 MG tablet Take 20 mg by mouth daily.   Past Week at Unknown time  . hydrALAZINE (APRESOLINE) 10 MG tablet Take 10 mg by mouth 3 (three) times daily.   Past Week at Unknown time  . ISOSORBIDE PO Take 300 mg by mouth.   Past Week at Unknown time  . methocarbamol (ROBAXIN) 500 MG tablet Take 500 mg by mouth 4 (four) times daily.   Past Week at Unknown time  . metoprolol tartrate (LOPRESSOR) 50 MG tablet Take 50 mg by mouth 2 (two) times daily.   Past Week at Unknown time  . Multiple Vitamin (MULTIVITAMIN) tablet Take 1 tablet by mouth daily.   Past Week at Unknown time  . sitaGLIPtin (JANUVIA) 50 MG tablet Take 50 mg by mouth daily.   Past Week at Unknown time  . sodium bicarbonate 650 MG tablet Take 650 mg by mouth 4 (four) times daily.   Past Week at Unknown time  . warfarin (COUMADIN) 2 MG tablet Take 2 mg by mouth daily.   Past Week  at Unknown time  . warfarin (COUMADIN) 4 MG tablet Take 4 mg by mouth daily.   Past Week at Unknown time   Scheduled:  . atorvastatin  10 mg Oral Daily  . insulin aspart  0-9 Units Subcutaneous TID WC  . mouth rinse  15 mL Mouth Rinse BID  . methocarbamol  500 mg Oral QID  . metoprolol tartrate  50 mg Oral BID  . sodium bicarbonate  650 mg Oral QID  . sodium chloride flush  3 mL Intravenous Q12H   Infusions:  . sodium chloride    . [START ON 10/20/2019] azithromycin    . diltiazem (CARDIZEM) infusion 5 mg/hr (10/19/19 0653)  . lactated ringers 75 mL/hr at 10/19/19 0741  . piperacillin-tazobactam (ZOSYN)  IV     PRN: sodium chloride, acetaminophen **OR** acetaminophen, bisacodyl, ondansetron **OR** ondansetron (ZOFRAN) IV, polyethylene glycol, sodium chloride flush Anti-infectives (From admission, onward)   Start     Dose/Rate Route Frequency Ordered Stop  10/20/19 0200  azithromycin (ZITHROMAX) 500 mg in sodium chloride 0.9 % 250 mL IVPB     500 mg 250 mL/hr over 60 Minutes Intravenous Every 24 hours 10/19/19 0622     10/19/19 1400  piperacillin-tazobactam (ZOSYN) IVPB 3.375 g     3.375 g 12.5 mL/hr over 240 Minutes Intravenous Every 8 hours 10/19/19 0635     10/19/19 0700  piperacillin-tazobactam (ZOSYN) IVPB 3.375 g     3.375 g 100 mL/hr over 30 Minutes Intravenous  Once 10/19/19 0635 10/19/19 0810   10/19/19 0315  metroNIDAZOLE (FLAGYL) IVPB 500 mg     500 mg 100 mL/hr over 60 Minutes Intravenous  Once 10/19/19 0314 10/19/19 0431   10/19/19 0100  cefTRIAXone (ROCEPHIN) 1 g in sodium chloride 0.9 % 100 mL IVPB     1 g 200 mL/hr over 30 Minutes Intravenous  Once 10/19/19 0053 10/19/19 0215   10/19/19 0100  azithromycin (ZITHROMAX) 500 mg in sodium chloride 0.9 % 250 mL IVPB     500 mg 250 mL/hr over 60 Minutes Intravenous  Once 10/19/19 0053 10/19/19 0227      Goal of Therapy:  INR 2-3 Monitor platelets by anticoagulation protocol: Yes    Prior to Admission Warfarin  Dosing:  Blake Wise takes 6mg  of warfarin daily       Admit INR was 10.2 Lab Results  Component Value Date   INR 10.2 ([place]) 10/18/2019    Assessment: Blake Wise a 74 y.o. male requires anticoagulation with warfarin for the indication of  atrial fibrillation. Warfarin will be initiated inpatient following pharmacy protocol per pharmacy consult. Patient most recent blood work is as follows: CBC Latest Ref Rng & Units 10/18/2019  WBC 4.0 - 10.5 K/uL 15.3(H)  Hemoglobin 13.0 - 17.0 g/dL 11.0(L)  Hematocrit 39.0 - 52.0 % 34.3(L)  Platelets 150 - 400 K/uL 349   Vitamin k 2.5mg  po give 1/3 am   Plan: Hold warfarin tonight  Monitor CBC daily with am labs   Monitor INR daily Monitor for signs and symptoms of bleeding   Donna Christen Letrell Attwood, PharmD, MBA, BCGP Clinical Pharmacist

## 2019-10-20 ENCOUNTER — Encounter (HOSPITAL_COMMUNITY): Payer: Self-pay | Admitting: Internal Medicine

## 2019-10-20 DIAGNOSIS — K529 Noninfective gastroenteritis and colitis, unspecified: Secondary | ICD-10-CM

## 2019-10-20 LAB — CBC
HCT: 22.4 % — ABNORMAL LOW (ref 39.0–52.0)
Hemoglobin: 7.2 g/dL — ABNORMAL LOW (ref 13.0–17.0)
MCH: 32.1 pg (ref 26.0–34.0)
MCHC: 32.1 g/dL (ref 30.0–36.0)
MCV: 100 fL (ref 80.0–100.0)
Platelets: 236 10*3/uL (ref 150–400)
RBC: 2.24 MIL/uL — ABNORMAL LOW (ref 4.22–5.81)
RDW: 17.6 % — ABNORMAL HIGH (ref 11.5–15.5)
WBC: 12.5 10*3/uL — ABNORMAL HIGH (ref 4.0–10.5)
nRBC: 0 % (ref 0.0–0.2)

## 2019-10-20 LAB — IRON AND TIBC
Iron: 27 ug/dL — ABNORMAL LOW (ref 45–182)
Saturation Ratios: 11 % — ABNORMAL LOW (ref 17.9–39.5)
TIBC: 246 ug/dL — ABNORMAL LOW (ref 250–450)
UIBC: 219 ug/dL

## 2019-10-20 LAB — HEMOGLOBIN AND HEMATOCRIT, BLOOD
HCT: 23.2 % — ABNORMAL LOW (ref 39.0–52.0)
Hemoglobin: 7.2 g/dL — ABNORMAL LOW (ref 13.0–17.0)

## 2019-10-20 LAB — COMPREHENSIVE METABOLIC PANEL
ALT: 32 U/L (ref 0–44)
AST: 40 U/L (ref 15–41)
Albumin: 2.6 g/dL — ABNORMAL LOW (ref 3.5–5.0)
Alkaline Phosphatase: 47 U/L (ref 38–126)
Anion gap: 11 (ref 5–15)
BUN: 33 mg/dL — ABNORMAL HIGH (ref 8–23)
CO2: 29 mmol/L (ref 22–32)
Calcium: 7.8 mg/dL — ABNORMAL LOW (ref 8.9–10.3)
Chloride: 104 mmol/L (ref 98–111)
Creatinine, Ser: 1.59 mg/dL — ABNORMAL HIGH (ref 0.61–1.24)
GFR calc Af Amer: 49 mL/min — ABNORMAL LOW (ref >60)
GFR calc non Af Amer: 42 mL/min — ABNORMAL LOW (ref >60)
Glucose, Bld: 61 mg/dL — ABNORMAL LOW (ref 70–99)
Potassium: 3.3 mmol/L — ABNORMAL LOW (ref 3.5–5.1)
Sodium: 144 mmol/L (ref 135–145)
Total Bilirubin: 1.7 mg/dL — ABNORMAL HIGH (ref 0.3–1.2)
Total Protein: 5.3 g/dL — ABNORMAL LOW (ref 6.5–8.1)

## 2019-10-20 LAB — GLUCOSE, CAPILLARY
Glucose-Capillary: 65 mg/dL — ABNORMAL LOW (ref 70–99)
Glucose-Capillary: 80 mg/dL (ref 70–99)
Glucose-Capillary: 92 mg/dL (ref 70–99)
Glucose-Capillary: 97 mg/dL (ref 70–99)
Glucose-Capillary: 67 mg/dL — ABNORMAL LOW (ref 70–99)
Glucose-Capillary: 129 mg/dL — ABNORMAL HIGH (ref 70–99)

## 2019-10-20 LAB — PROTIME-INR
INR: 1.8 — ABNORMAL HIGH (ref 0.8–1.2)
Prothrombin Time: 21 seconds — ABNORMAL HIGH (ref 11.4–15.2)

## 2019-10-20 LAB — LACTIC ACID, PLASMA: Lactic Acid, Venous: 1.2 mmol/L (ref 0.5–1.9)

## 2019-10-20 LAB — MAGNESIUM: Magnesium: 2.2 mg/dL (ref 1.7–2.4)

## 2019-10-20 LAB — FOLATE: Folate: 14.3 ng/mL (ref >5.9)

## 2019-10-20 LAB — RETICULOCYTES
Immature Retic Fract: 22.1 % — ABNORMAL HIGH (ref 2.3–15.9)
RBC.: 2.31 MIL/uL — ABNORMAL LOW (ref 4.22–5.81)
Retic Count, Absolute: 73.7 10*3/uL (ref 19.0–186.0)
Retic Ct Pct: 3.2 % — ABNORMAL HIGH (ref 0.4–3.1)

## 2019-10-20 LAB — MRSA PCR SCREENING: MRSA by PCR: NEGATIVE

## 2019-10-20 LAB — FERRITIN: Ferritin: 63 ng/mL (ref 24–336)

## 2019-10-20 LAB — VITAMIN B12: Vitamin B-12: 487 pg/mL (ref 180–914)

## 2019-10-20 MED ORDER — INSULIN ASPART 100 UNIT/ML ~~LOC~~ SOLN
0.0000 [IU] | Freq: Three times a day (TID) | SUBCUTANEOUS | Status: DC
  Administered 2019-10-22 – 2019-10-27 (×8): 1 [IU] via SUBCUTANEOUS
  Administered 2019-10-27: 3 [IU] via SUBCUTANEOUS
  Administered 2019-10-27 – 2019-10-28 (×3): 1 [IU] via SUBCUTANEOUS

## 2019-10-20 MED ORDER — PANTOPRAZOLE SODIUM 40 MG IV SOLR
40.0000 mg | Freq: Two times a day (BID) | INTRAVENOUS | Status: DC
  Administered 2019-10-20 – 2019-10-22 (×5): 40 mg via INTRAVENOUS
  Filled 2019-10-20 (×5): qty 40

## 2019-10-20 MED ORDER — DILTIAZEM HCL 60 MG PO TABS
60.0000 mg | ORAL_TABLET | Freq: Four times a day (QID) | ORAL | Status: DC
  Administered 2019-10-20 – 2019-10-21 (×4): 60 mg via ORAL
  Filled 2019-10-20 (×4): qty 1

## 2019-10-20 MED ORDER — LACTATED RINGERS IV BOLUS
500.0000 mL | Freq: Once | INTRAVENOUS | Status: AC
  Administered 2019-10-20: 500 mL via INTRAVENOUS

## 2019-10-20 MED ORDER — BOOST / RESOURCE BREEZE PO LIQD CUSTOM
1.0000 | Freq: Three times a day (TID) | ORAL | Status: DC
  Administered 2019-10-20 – 2019-10-28 (×23): 1 via ORAL

## 2019-10-20 MED ORDER — LEVALBUTEROL HCL 0.63 MG/3ML IN NEBU
0.6300 mg | INHALATION_SOLUTION | Freq: Four times a day (QID) | RESPIRATORY_TRACT | Status: DC | PRN
  Administered 2019-10-20 – 2019-10-23 (×2): 0.63 mg via RESPIRATORY_TRACT
  Filled 2019-10-20 (×2): qty 3

## 2019-10-20 MED ORDER — SODIUM CHLORIDE 0.9 % IV SOLN
510.0000 mg | Freq: Once | INTRAVENOUS | Status: AC
  Administered 2019-10-20: 14:00:00 510 mg via INTRAVENOUS
  Filled 2019-10-20: qty 17

## 2019-10-20 MED ORDER — POTASSIUM CHLORIDE 10 MEQ/100ML IV SOLN
10.0000 meq | INTRAVENOUS | Status: AC
  Administered 2019-10-20 (×4): 10 meq via INTRAVENOUS
  Filled 2019-10-20 (×4): qty 100

## 2019-10-20 NOTE — Consult Note (Signed)
Referring Provider: Dr. Manuella Ghazi  Primary Care Physician:  Patient, No Pcp Per Primary Gastroenterologist:  Dr. Oneida Alar (previously unassigned)   Date of Admission: 10/18/2019 Date of Consultation: 10/20/2019  Reason for Consultation: Acute blood loss anemia with dark stools  HPI:  Blake Wise is a 74 y.o. year old male presenting to the ED with abdominal pain, vomiting, supratherapeutic INR of 10.2 in setting of chronic Coumadin, found to have afib with RVR on arrival, sepsis in setting of pneumonia and concerns for aspiration, enteritis, mild acute on chronic hypoxemic respiratory failure, elevated troponin likely due to demand ischemia. Admitting Hgb 11 and down to 7.2 this morning. Ferritin 63, iron low at 27, sats low at 11.   CT abd/pelvis with marked wall thickening and mucosal enhancement involving duodenum and proximal jejunum, with associated mesenteric edema and free fluid.   Received Vit K due to supratherapeutic INR, with improvement to 1.8 this morning. Off Cardizem drip now and transitioned to oral cardizem.  Mild hypotension with fluid bolus ordered. IV Protonix BID started today. IV Feraheme given this afternoon.  Patient notes he has a chronic history of LUQ pain dating back years. Intermittent, no association with eating/drinking. Reports acute abdominal pain, N/V starting prior to admission and saw coffee-ground emesis. States several weeks was constipated and took milk of magnesia, then saw black stool. Prior to this, stool had been brown. He also noted black stool on Saturday prior to admission. No abdominal pain currently and feels improved since admission. Stomach feels empty. No nausea. Denies diarrhea. Sometimes will get choked on solids when eating too fast. Last EGD and colonoscopy approximately 5 years ago by Dr. Ladona Horns at [facility]. Records not available. Feels he is back to near his baseline from respiratory standpoint. Reports chronic cough intermittently. Wears oxygen at  night. Denies any OTC NSAIDs for at least the past year.   Past Medical History:  Diagnosis Date  . A-fib ([place])   . CHF (congestive heart failure) ([place])   . COPD (chronic obstructive pulmonary disease) ([place])   . Diabetes mellitus without complication ([place])   . Hypertension     Past Surgical History:  Procedure Laterality Date  . COLONOSCOPY WITH ESOPHAGOGASTRODUODENOSCOPY (EGD)     several years ago by Dr. Ladona Horns (2015??)    Prior to Admission medications   Medication Sig Start Date End Date Taking? Authorizing Provider  albuterol (VENTOLIN HFA) 108 (90 Base) MCG/ACT inhaler Inhale into the lungs.   Yes [provider]  atorvastatin (LIPITOR) 10 MG tablet Take 10 mg by mouth daily.   Yes [provider]  Cholecalciferol 25 MCG (1000 UT) tablet Take by mouth.   Yes [provider]  diltiazem (TIAZAC) 240 MG 24 hr capsule Take 240 mg by mouth daily.   Yes [provider]  furosemide (LASIX) 20 MG tablet Take 20 mg by mouth daily.   Yes [provider]  glimepiride (AMARYL) 1 MG tablet Take 1 mg by mouth daily. 09/13/19  Yes [provider]  hydrALAZINE (APRESOLINE) 10 MG tablet Take 10 mg by mouth 3 (three) times daily.   Yes [provider]  ipratropium-albuterol (DUONEB) 0.5-2.5 (3) MG/3ML SOLN Inhale 3 mLs into the lungs every 6 (six) hours as needed (breathing).    Yes [provider]  isosorbide mononitrate (IMDUR) 30 MG 24 hr tablet Take 30 mg by mouth daily. 08/20/19  Yes [provider]  metoprolol tartrate (LOPRESSOR) 50 MG tablet Take 50 mg by mouth 2 (  two) times daily.   Yes [provider]  Multiple Vitamins-Minerals (MENS ONE DAILY PO) Take 1 tablet by mouth daily.   Yes [provider]  Omega-3 Fatty Acids (FISH OIL) 1000 MG CAPS Take 1 capsule by mouth daily.    Yes [provider]  sitaGLIPtin (JANUVIA) 50 MG tablet Take 50 mg by mouth daily.   Yes [provider]  sodium bicarbonate 650 MG tablet Take 650 mg by mouth 4 (four) times daily.   Yes [provider]  SPIRIVA HANDIHALER 18 MCG inhalation capsule 1 capsule daily. 09/26/19  Yes [provider]  SYMBICORT 160-4.5 MCG/ACT inhaler Inhale 1 puff into the lungs daily.  09/08/19  Yes [provider]  warfarin (COUMADIN) 2 MG tablet Take 2 mg by mouth See admin instructions. Take 2 mg tablet every Wed, Thurs, Friday, and Saturday along with 4 mg for a total of 6 mg   Yes [provider]  warfarin (COUMADIN) 4 MG tablet Take 4 mg by mouth See admin instructions. Take 1 tablet daily.  Take along with 2 mg tablet on Wed, Thurs, Friday, and Saturday for a total of 6 mg.   Yes [provider]  COLCRYS 0.6 MG tablet Take 0.6 mg by mouth 2 (two) times daily. 07/31/19   [provider]  lisinopril (ZESTRIL) 10 MG tablet Take 10 mg by mouth daily.  09/08/19   [provider]  methocarbamol (ROBAXIN) 500 MG tablet Take 500 mg by mouth 4 (four) times daily.    [provider]  mirtazapine (REMERON) 15 MG tablet Take 15 mg by mouth at bedtime.  08/20/19   [provider]    Current Facility-Administered Medications  Medication Dose Route Frequency Provider Last Rate Last Admin  . 0.9 %  sodium chloride infusion  250 mL Intravenous PRN Lynetta Mare, MD      . acetaminophen (TYLENOL) tablet 650 mg  650 mg Oral Q6H PRN Lynetta Mare, MD       Or  . acetaminophen (TYLENOL) suppository 650 mg  650 mg Rectal Q6H PRN Lynetta Mare, MD      . atorvastatin (LIPITOR) tablet 10 mg  10 mg Oral Daily Lynetta Mare, MD   10 mg at 10/20/19 0825  . azithromycin (ZITHROMAX) 500 mg in sodium chloride 0.9 % 250 mL IVPB  500 mg Intravenous Q24H Lynetta Mare, MD   Stopped at 10/20/19 0435  . bisacodyl (DULCOLAX) suppository 10 mg  10 mg Rectal Daily PRN Lynetta Mare, MD      . Chlorhexidine Gluconate Cloth 2 % PADS  6 each  6 each Topical Daily Heath Lark D, DO   6 each at 10/19/19 1634  . diltiazem (CARDIZEM) tablet 60 mg  60 mg Oral Q6H Shah, Pratik D, DO   60 mg at 10/20/19 0932  . ferumoxytol (FERAHEME) 510 mg in sodium chloride 0.9 % 100 mL IVPB  510 mg Intravenous Once Heath Lark D, DO 468 mL/hr at 10/20/19 1403 510 mg at 10/20/19 1403  . insulin aspart (novoLOG) injection 0-6 Units  0-6 Units Subcutaneous TID WC Shah, Pratik D, DO      . lactated ringers infusion   Intravenous Continuous Heath Lark D, DO 75 mL/hr at 10/20/19 1100 Rate Verify at 10/20/19 1100  . levalbuterol (XOPENEX) nebulizer solution 0.63 mg  0.63 mg Nebulization Q6H PRN Manuella Ghazi, Pratik D, DO      . MEDLINE mouth rinse  15  mL Mouth Rinse BID Lynetta Mare, MD   15 mL at 10/20/19 0935  . methocarbamol (ROBAXIN) tablet 500 mg  500 mg Oral QID Lynetta Mare, MD   500 mg at 10/20/19 1403  . metoprolol tartrate (LOPRESSOR) tablet 50 mg  50 mg Oral BID Manuella Ghazi, Pratik D, DO   50 mg at 10/20/19 0825  . ondansetron (ZOFRAN) tablet 4 mg  4 mg Oral Q6H PRN Lynetta Mare, MD       Or  . ondansetron [facility]) injection 4 mg  4 mg Intravenous Q6H PRN Lynetta Mare, MD      . pantoprazole (PROTONIX) injection 40 mg  40 mg Intravenous Q12H Shah, Pratik D, DO   40 mg at 10/20/19 1151  . piperacillin-tazobactam (ZOSYN) IVPB 3.375 g  3.375 g Intravenous Q8H Lynetta Mare, MD 12.5 mL/hr at 10/20/19 0629 3.375 g at 10/20/19 0629  . polyethylene glycol (MIRALAX / GLYCOLAX) packet 17 g  17 g Oral Daily PRN Lynetta Mare, MD      . sodium bicarbonate tablet 650 mg  650 mg Oral QID Lynetta Mare, MD   650 mg at 10/20/19 1403  . sodium chloride flush (NS) 0.9 % injection 3 mL  3 mL Intravenous Q12H Lynetta Mare, MD   3 mL at 10/20/19 0831  . sodium chloride flush (NS) 0.9 % injection 3 mL  3 mL Intravenous PRN Lynetta Mare, MD        Allergies as of 10/18/2019  . (Not on File)    Family History  Problem Relation Age  of Onset  . Colon cancer Neg Hx   . Colon polyps Neg Hx     Social History   Socioeconomic History  . Marital status: Married    Spouse name: Not on file  . Number of children: Not on file  . Years of education: Not on file  . Highest education level: Not on file  Occupational History  . Not on file  Tobacco Use  . Smoking status: Former Research scientist (life sciences)  . Smokeless tobacco: Never Used  Substance and Sexual Activity  . Alcohol use: Not Currently  . Drug use: Never  . Sexual activity: Not on file  Other Topics Concern  . Not on file  Social History Narrative  . Not on file   Social Determinants of Health   Financial Resource Strain:   . Difficulty of Paying Living Expenses: Not on file  Food Insecurity:   . Worried About Charity fundraiser in the Last Year: Not on file  . Ran Out of Food in the Last Year: Not on file  Transportation Needs:   . Lack of Transportation (Medical): Not on file  . Lack of Transportation (Non-Medical): Not on file  Physical Activity:   . Days of Exercise per Week: Not on file  . Minutes of Exercise per Session: Not on file  Stress:   . Feeling of Stress : Not on file  Social Connections:   . Frequency of Communication with Friends and Family: Not on file  . Frequency of Social Gatherings with Friends and Family: Not on file  . Attends Religious Services: Not on file  . Active Member of Clubs or Organizations: Not on file  . Attends Archivist Meetings: Not on file  . Marital Status: Not on file  Intimate Partner Violence:   . Fear of Current or Ex-Partner: Not on file  . Emotionally Abused: Not on  file  . Physically Abused: Not on file  . Sexually Abused: Not on file    Review of Systems: Gen: see HPI CV: Denies chest pain, heart palpitations, syncope, edema  Resp: see HPI GI: see HPI GU : Denies urinary burning, urinary frequency, urinary incontinence.  MS: Denies joint pain,swelling, cramping Derm: Denies rash, itching, dry  skin Psych: Denies depression, anxiety,confusion, or memory loss Heme: see HPI  Physical Exam: Vital signs in last 24 hours: Temp:  [97.7 F (36.5 C)-99.6 F (37.6 C)] 98.1 F (36.7 C) (01/04 1127) Pulse Rate:  [55-93] 73 (01/04 1200) Resp:  [12-23] 21 (01/04 1200) BP: (81-127)/(51-91) 88/66 (01/04 1200) SpO2:  [86 %-98 %] 97 % (01/04 1200) Weight:  [54.7 kg] 54.7 kg (01/04 0500) Last BM Date: 10/15/20 General:   Alert,  Well-developed, well-nourished, pleasant and cooperative in NAD Head:  Normocephalic and atraumatic. Eyes:  Sclera clear, no icterus.   Conjunctiva pink. Mouth:  Poor dentition  Lungs:  Scattered rhonchi  Heart:  S1 S2 present, irregularly irregular  Abdomen:  Soft, nontender and nondistended. No masses, hepatosplenomegaly or hernias noted. Normal bowel sounds, without guarding, and without rebound.   Rectal:  Deferred  Msk:  Symmetrical without gross deformities. Normal posture. Extremities:  Without  edema. Neurologic:  Alert and  oriented x4 Psych:  Alert and cooperative. Normal mood and affect.  Intake/Output from previous day: 01/03 0701 - 01/04 0700 In: -  Out: 850 [Urine:850] Intake/Output this shift: Total I/O In: 3635.1 [I.V.:2269.2; IV Piggyback:1365.9] Out: -   Lab Results: Recent Labs    10/18/19 2318 10/20/19 0450 10/20/19 1028  WBC 15.3* 12.5*  --   HGB 11.0* 7.2* 7.2*  HCT 34.3* 22.4* 23.2*  PLT 349 236  --    BMET Recent Labs    10/18/19 2355 10/20/19 0450  NA 147* 144  K 4.1 3.3*  CL 103 104  CO2 29 29  GLUCOSE 156* 61*  BUN 35* 33*  CREATININE 1.61* 1.59*  CALCIUM 8.6* 7.8*   LFT Recent Labs    10/18/19 2355 10/20/19 0450  PROT 7.2 5.3*  ALBUMIN 3.5 2.6*  AST 55* 40  ALT 43 32  ALKPHOS 76 47  BILITOT 2.5* 1.7*   PT/INR Recent Labs    10/18/19 2319 10/20/19 0450  LABPROT 81.3* 21.0*  INR 10.2* 1.8*    Studies/Results: CT ABDOMEN PELVIS W CONTRAST  Result Date: 10/19/2019 CLINICAL DATA:  Abdominal  pain. Nausea and vomiting. Abdominal abscess/infection suspected EXAM: CT ABDOMEN AND PELVIS WITH CONTRAST TECHNIQUE: Multidetector CT imaging of the abdomen and pelvis was performed using the standard protocol following bolus administration of intravenous contrast. CONTRAST:  153mL OMNIPAQUE IOHEXOL 300 MG/ML  SOLN COMPARISON:  Chest radiograph yesterday. Report from abdominopelvic CT 12/24/2015, images not available FINDINGS: Lower chest: Emphysema. Ground-glass opacities in the right middle lobe are partially included. Multi chamber cardiomegaly with primarily right heart dilatation. Contrast refluxes into the hepatic veins and IVC. Hepatobiliary: Stones and sludge in the gallbladder without gallbladder inflammation. No biliary dilatation. Tiny subcentimeter hypodensity in the subcapsular right lobe of the liver is too small to characterize. Contrast refluxes into the hepatic veins and IVC. Pancreas: No ductal dilatation or inflammation. Spleen: Heterogeneous enhancement likely due to phase of contrast. Spleen is normal in size. Adrenals/Urinary Tract: Normal adrenal glands. No hydronephrosis. No perinephric edema. Small cyst in the posterior left kidney. Urinary bladder is markedly distended to the umbilicus. Bladder volume = 1500 cm^3. Bladder slightly protrudes into the right obturator  fossa. No bladder wall thickening. Questionable diverticulum at the bladder dome. Stomach/Bowel: Fluid-filled stomach, with minimal fluid in the distal esophagus. There is no gastric wall thickening. Wall thickening with mucosal enhancement involving the fourth portion of the duodenum, and more prominently the proximal jejunum. Moderate length segment of small bowel involvement. No obstruction. There is associated mesenteric edema and free fluid. No pneumatosis or perforation. More distal small bowel are nondistended, slightly fluid-filled but noninflamed. There is small bowel interposition lateral to the right lobe of the  liver. Colonic diverticulosis without diverticulitis. The appendix is not well visualized. There is no evidence of appendicitis. Vascular/Lymphatic: Moderate aortic atherosclerosis. No aortic aneurysm. Portal vein is patent. The mesenteric artery and veins are patent. No gross adenopathy. Reproductive: Prostate is unremarkable. Other: Small to moderate volume of free fluid/ascites. Moderate mesenteric edema and free fluid. No free air. No intra-abdominal abscess. Musculoskeletal: Degenerative change in the spine. Avascular necrosis of the femoral heads without collapse. IMPRESSION: 1. Mark wall thickening with mucosal enhancement involving the fourth portion of the duodenum and proximal jejunum, with associated mesenteric edema and free fluid. Findings may represent infectious or inflammatory enteritis versus bowel angioedema. No evidence of bowel ischemia or perforation. 2. Markedly distended urinary bladder to the umbilicus. Questionable bladder dome diverticulum. Similar findings were described on prior exam, this may be chronic. 3. Gallstones and sludge in the gallbladder without gallbladder inflammation. 4. Colonic diverticulosis without diverticulitis. 5. Multi chamber cardiomegaly with primarily right heart dilatation, and contrast refluxing into the hepatic veins and IVC consistent with right heart failure. Aortic Atherosclerosis (ICD10-I70.0) and Emphysema (ICD10-J43.9). Electronically Signed   By: Keith Rake M.D.   On: 10/19/2019 03:01   DG Chest Portable 1 View  Result Date: 10/19/2019 CLINICAL DATA:  Shortness of breath. Nausea and vomiting. EXAM: PORTABLE CHEST 1 VIEW COMPARISON:  Radiograph 07/10/2018. FINDINGS: Patchy heterogeneous airspace opacities throughout the right right upper and midlung zones. Possible minimal fluid in the right minor fissure. No definite airspace disease in the left lung. Mild cardiomegaly is unchanged. Mild aortic atherosclerosis. No pneumothorax. Chronic  hyperinflation. No acute osseous abnormalities are seen. IMPRESSION: 1. Patchy heterogeneous airspace opacities in the right upper and midlung zones, suspicious for pneumonia. Unilateral involvement more typical of broncho pneumonia rather than COVID. 2. Stable mild cardiomegaly and hyperinflation. Electronically Signed   By: Keith Rake M.D.   On: 10/19/2019 00:23   ECHOCARDIOGRAM COMPLETE  Result Date: 10/19/2019   ECHOCARDIOGRAM REPORT   Patient Name:   Blake Wise Date of Exam: 10/19/2019 Medical Rec #:  [id number]    Height:       71.0 in Accession #:    [id number]   Weight:       124.1 lb Date of Birth:  13-Jul-1946   BSA:          1.72 m Patient Age:    33 years     BP:           152/84 mmHg Patient Gender: M            HR:           112 bpm. Exam Location:  [facility] Procedure: 2D Echo Indications:    atrial fibrillation  History:        Patient has no prior history of Echocardiogram examinations.                 CHF, COPD, Arrythmias:Atrial Fibrillation; Risk  Factors:Diabetes, Hypertension and Former Smoker.  Sonographer:    Jannett Celestine RDCS (AE) Referring Phys: [place]  1. Left ventricular ejection fraction, by visual estimation, is 50 to 55%. The left ventricle has low normal function. There is no left ventricular hypertrophy.  2. Left ventricular diastolic parameters are indeterminate.  3. Right ventricular volume and pressure overload.  4. The left ventricle has no regional wall motion abnormalities.  5. Global right ventricle has mildly to moderately reduced systolic function.The right ventricular size is severely enlarged. No increase in right ventricular wall thickness.  6. Left atrial size was normal.  7. Right atrial size was severely dilated.  8. The mitral valve is grossly normal. Moderate mitral valve regurgitation.  9. The tricuspid valve is grossly normal. 10. The aortic valve is tricuspid. Aortic valve regurgitation is not visualized. Mild  aortic valve sclerosis without stenosis. 11. The pulmonic valve was not well visualized. Pulmonic valve regurgitation is trivial. 12. Severely elevated pulmonary artery systolic pressure. 13. The tricuspid regurgitant velocity is 4.03 m/s, and with an assumed right atrial pressure of 3 mmHg, the estimated right ventricular systolic pressure is severely elevated at 68.0 mmHg. 14. The inferior vena cava is normal in size with greater than 50% respiratory variability, suggesting right atrial pressure of 3 mmHg. FINDINGS  Left Ventricle: Left ventricular ejection fraction, by visual estimation, is 50 to 55%. The left ventricle has low normal function. The left ventricle has no regional wall motion abnormalities. The left ventricular internal cavity size was the left ventricle is normal in size. There is no left ventricular hypertrophy. The interventricular septum is flattened in systole and diastole, consistent with right ventricular pressure and volume overload. Left ventricular diastolic parameters are indeterminate. Right Ventricle: The right ventricular size is severely enlarged. No increase in right ventricular wall thickness. Global RV systolic function is has mildly reduced systolic function. The tricuspid regurgitant velocity is 4.03 m/s, and with an assumed right atrial pressure of 3 mmHg, the estimated right ventricular systolic pressure is severely elevated at 68.0 mmHg. Left Atrium: Left atrial size was normal in size. Right Atrium: Right atrial size was severely dilated Pericardium: There is no evidence of pericardial effusion. Mitral Valve: The mitral valve is grossly normal. Moderate mitral valve regurgitation. Tricuspid Valve: The tricuspid valve is grossly normal. Tricuspid valve regurgitation moderate-severe. Aortic Valve: The aortic valve is tricuspid. Aortic valve regurgitation is not visualized. Mild aortic valve sclerosis is present, with no evidence of aortic valve stenosis. Pulmonic Valve: The  pulmonic valve was not well visualized. Pulmonic valve regurgitation is trivial. Pulmonic regurgitation is trivial. Aorta: The aortic root is normal in size and structure. Venous: The inferior vena cava is normal in size with greater than 50% respiratory variability, suggesting right atrial pressure of 3 mmHg. IAS/Shunts: No atrial level shunt detected by color flow Doppler.  LEFT VENTRICLE PLAX 2D LVIDd:         3.56 cm LVIDs:         2.66 cm LV PW:         0.98 cm LV IVS:        0.78 cm LVOT diam:     2.40 cm LV SV:         27 ml LV SV Index:   16.22 LVOT Area:     4.52 cm  RIGHT VENTRICLE RV S prime:     17.30 cm/s TAPSE (M-mode): 1.5 cm LEFT ATRIUM  Index       RIGHT ATRIUM           Index LA diam:      3.80 cm 2.21 cm/m  RA Area:     19.00 cm LA Vol (A4C): 32.8 ml 19.05 ml/m RA Volume:   50.00 ml  29.04 ml/m  AORTIC VALVE LVOT Vmax:   65.00 cm/s LVOT Vmean:  51.100 cm/s LVOT VTI:    0.143 m  AORTA Ao Root diam: 2.70 cm MITRAL VALVE                       TRICUSPID VALVE MV Area (PHT): 2.76 cm            TR Peak grad:   65.0 mmHg MV PHT:        79.75 msec          TR Vmax:        403.00 cm/s MV Decel Time: 275 msec MV E velocity: 60.60 cm/s 103 cm/s SHUNTS                                    Systemic VTI:  0.14 m                                    Systemic Diam: 2.40 cm  Rozann Lesches MD Electronically signed by Rozann Lesches MD Signature Date/Time: 10/19/2019/12:58:01 PM    Final     Impression: Very pleasant 74 year old male admitted with sepsis in setting of pneumonia and enteritis, afib with RVR, supratherapeutic INR of 10.2 in setting of Coumadin, now improved s/p Vit K. GI consulted due to acute drop in Hgb from 11 range on admission to 7.2 and reports of black stool and coffee-ground emesis prior to admission.  CT with marked wall thickening and mucosal enhancement involving duodenum and proximal jejunum, with associated mesenteric edema and free fluid. Hemoccult has been requested.    Clinically, he feels much improved since admission and similar to baseline respiratory status, denies abdominal pain, and has had no further overt signs of GI bleeding. Received IV Feraheme today. Last colonoscopy/EGD reportedly normal approximately 5 years ago in [place] by Dr. Ladona Horns per patient.   Would benefit from EGD for further evaluation. May have full liquids today. Timing of endoscopy to be determined.   Plan: Full liquids Continue IV BID PPI Follow CBC Obtain hemoccult Endoscopy in near future. Will make NPO at midnight in preparation  Reassess in morning   Annitta Needs, PhD, ANP-BC [facility] Gastroenterology     LOS: 1 day    10/20/2019, 2:09 PM

## 2019-10-20 NOTE — Progress Notes (Signed)
PROGRESS NOTE    Blake Wise  LXB:[id number] DOB: 02-Oct-1946 DOA: 10/18/2019 PCP: Patient, No Pcp Per   Brief Narrative:  Per HPI: Blake Wise a 74 y.o.malewith medical history significant ofatrial fibrillation, congestive heart failure, COPD, hypertension, hyperlipidemia, diabetes mellitus type 2 who presented to the ER with worsening shortness of breath, abdominal pain. Patient is states he has been having left upper quadrant abdominal pain for the last 1 week. He is also had multiple episodes of vomiting. Patient reports progressively worsening shortness of breath, described as dyspnea on exertion.He is also had a cough with bloody sputum over the last 2 days. Today he had palpitations which persisted through the day. No chest pain reported. No fever, chills. No hematemesis, melena, hematochezia, dysuria, diarrhea, syncope, seizures, dizziness, lightheadedness, focal deficits, blurring of vision.  1/3: Patient has been admitted with sepsis secondary to enteritis as well as possibly aspiration pneumonia versus community-acquired pneumonia.  He has associated atrial fibrillation with RVR along with supratherapeutic INR he has been given some vitamin K.  His heart rates are currently improving on Cardizem drip, but he is noted to have multiple PVCs on telemetry.  He denies any chest pain, palpitations, or shortness of breath and is currently on 3 L nasal cannula.  1/4: Patient is noted to have acute anemia with hemoglobin down to 7.2 this morning.  No overt bleeding identified, however patient does describe to dark bowel movements prior to admission.  He states that he slightly nauseated as well.  He will be started on Protonix IV twice daily with stool occult ordered and pending.  Anemia panel with iron deficiency and he will be given some Feraheme.  Continue monitor CBC and transfuse for hemoglobin less than 7.  Heart rate appears stable at this time and patient is off Cardizem drip.   Blood pressures are soft and he will receive a IV fluid bolus.  He has been transitioned to oral Cardizem every 6 hours for now.  GI consulted for further evaluation of GI bleed.  Transition to clear liquid diet with n.p.o. after midnight.  Assessment & Plan:   Principal Problem:   Atrial fibrillation with RVR (HCC) Active Problems:   Chronic diastolic CHF (congestive heart failure) (HCC)   Chest pain   DM2 (diabetes mellitus, type 2) (HCC)   HTN (hypertension)   Acute urinary retention   Elevated INR   Community acquired pneumonia   Enteritis   Sepsis-multifactorial with enteritis as well as right-sided pneumonia with concern for aspiration-improving -Continue on Zosyn and azithromycin as ordered -Continue on gentle IV fluid for now -Blood culture with no growth x1 day  Atrial fibrillation with RVR secondary to above-resolved -IV Cardizem drip discontinued and patient transition to oral Cardizem 60 mg every 6 hours based on his home dose of 240 mg every 24 hours -Continue on home metoprolol -2D echo with LVEF 50-55% -Patient had supratherapeutic INR which has now resolved  Acute anemia-suspect blood loss -Patient with 2 dark bowel movements per admission and had a significant drop in hemoglobin levels with soft blood pressure readings -Continue stepdown unit monitoring -PPI IV twice daily initiated -Transition to clear liquid diet with n.p.o. after midnight -Appreciate GI consultation for endoscopy -Discussed with pharmacy holding Coumadin for now  Mild acute on chronic hypoxemic respiratory failure -Currently on 3 L and baseline 2 L at home -Wean as tolerated  Supratherapeutic INR-resolved -Appreciate pharmacy management -Given vitamin K on 1/3 -Holding Coumadin for now due to suspected GI bleed  Troponin elevation likely secondary to demand ischemia -No chest pain or findings of ACS on EKG  Chronic congestive heart failure -No volume overload currently  noted -Follow daily weights -Repeat 2D echocardiogram with noted LVEF 50-55% -Lasix on hold due to sepsis physiology, will need to be reinitiated once this resolves  Hypertension-stable -Continue metoprolol as well as Cardizem drip and monitor carefully  Type 2 diabetes -A1c pending -Hold Januvia for now -Some mild hypoglycemia noted and therefore sliding scale insulin tapered back per DM coordinator recommendations  Chronic COPD on home 2 L nasal cannula oxygen -No acute exacerbation currently noted -We will switch to Xopenex as needed for shortness of breath or wheezing   DVT prophylaxis: Coumadin with INR 1.8.  Coumadin being held on account of potential GI bleed Code Status: Full Family Communication: None at bedside Disposition Plan:  Continue careful monitoring and transfuse as needed.  Continue current antibiotics.  Monitor heart rate and blood pressure on current medications.   Consultants:   GI  Procedures:   None  Antimicrobials:  Anti-infectives (From admission, onward)   Start     Dose/Rate Route Frequency Ordered Stop   10/20/19 0200  azithromycin (ZITHROMAX) 500 mg in sodium chloride 0.9 % 250 mL IVPB     500 mg 250 mL/hr over 60 Minutes Intravenous Every 24 hours 10/19/19 0622     10/19/19 1400  piperacillin-tazobactam (ZOSYN) IVPB 3.375 g     3.375 g 12.5 mL/hr over 240 Minutes Intravenous Every 8 hours 10/19/19 0635     10/19/19 0700  piperacillin-tazobactam (ZOSYN) IVPB 3.375 g     3.375 g 100 mL/hr over 30 Minutes Intravenous  Once 10/19/19 0635 10/19/19 0810   10/19/19 0315  metroNIDAZOLE (FLAGYL) IVPB 500 mg     500 mg 100 mL/hr over 60 Minutes Intravenous  Once 10/19/19 0314 10/19/19 0431   10/19/19 0100  cefTRIAXone (ROCEPHIN) 1 g in sodium chloride 0.9 % 100 mL IVPB     1 g 200 mL/hr over 30 Minutes Intravenous  Once 10/19/19 0053 10/19/19 0215   10/19/19 0100  azithromycin (ZITHROMAX) 500 mg in sodium chloride 0.9 % 250 mL IVPB      500 mg 250 mL/hr over 60 Minutes Intravenous  Once 10/19/19 0053 10/19/19 0227       Subjective: Patient seen and evaluated today with no new acute complaints or concerns. No acute concerns or events noted overnight.  He states that he is mildly nauseated.  Heart rates are better controlled, but he has noted to have soft blood pressure readings.  Normal bowel movements noted overnight.  Objective: Vitals:   10/20/19 1127 10/20/19 1130 10/20/19 1145 10/20/19 1200  BP:  90/62 (!) 85/63 (!) 88/66  Pulse:  72 70 73  Resp:  19 17 (!) 21  Temp: 98.1 F (36.7 C)     TempSrc: Oral     SpO2:  96% 97% 97%  Weight:      Height:        Intake/Output Summary (Last 24 hours) at 10/20/2019 1253 Last data filed at 10/20/2019 1100 Gross per 24 hour  Intake 2731.59 ml  Output 850 ml  Net 1881.59 ml   Filed Weights   10/18/19 2253 10/19/19 0600 10/20/19 0500  Weight: 55.8 kg 56.3 kg 54.7 kg    Examination:  General exam: Appears calm and comfortable  Respiratory system: Clear to auscultation. Respiratory effort normal.  Currently on 3 L nasal cannula. Cardiovascular system: S1 & S2 heard, irregular. No  JVD, murmurs, rubs, gallops or clicks. No pedal edema. Gastrointestinal system: Abdomen is nondistended, soft and nontender. No organomegaly or masses felt. Normal bowel sounds heard. Central nervous system: Alert and oriented. No focal neurological deficits. Extremities: Symmetric 5 x 5 power. Skin: No rashes, lesions or ulcers Psychiatry: Judgement and insight appear normal. Mood & affect appropriate.     Data Reviewed: I have personally reviewed following labs and imaging studies  CBC: Recent Labs  Lab 10/18/19 2318 10/20/19 0450 10/20/19 1028  WBC 15.3* 12.5*  --   NEUTROABS 13.3*  --   --   HGB 11.0* 7.2* 7.2*  HCT 34.3* 22.4* 23.2*  MCV 96.9 100.0  --   PLT 349 236  --    Basic Metabolic Panel: Recent Labs  Lab 10/18/19 2355 10/20/19 0450  NA 147* 144  K 4.1 3.3*   CL 103 104  CO2 29 29  GLUCOSE 156* 61*  BUN 35* 33*  CREATININE 1.61* 1.59*  CALCIUM 8.6* 7.8*  MG 2.0 2.2   GFR: Estimated Creatinine Clearance: 31.5 mL/min (A) (by C-G formula based on SCr of 1.59 mg/dL (H)). Liver Function Tests: Recent Labs  Lab 10/18/19 2355 10/20/19 0450  AST 55* 40  ALT 43 32  ALKPHOS 76 47  BILITOT 2.5* 1.7*  PROT 7.2 5.3*  ALBUMIN 3.5 2.6*   Recent Labs  Lab 10/18/19 2355  LIPASE 14   No results for input(s): AMMONIA in the last 168 hours. Coagulation Profile: Recent Labs  Lab 10/18/19 2319 10/20/19 0450  INR 10.2* 1.8*   Cardiac Enzymes: No results for input(s): CKTOTAL, CKMB, CKMBINDEX, TROPONINI in the last 168 hours. BNP (last 3 results) No results for input(s): PROBNP in the last 8760 hours. HbA1C: No results for input(s): HGBA1C in the last 72 hours. CBG: Recent Labs  Lab 10/19/19 1624 10/19/19 2124 10/20/19 0757 10/20/19 0833 10/20/19 1129  GLUCAP 88 85 65* 80 92   Lipid Profile: No results for input(s): CHOL, HDL, LDLCALC, TRIG, CHOLHDL, LDLDIRECT in the last 72 hours. Thyroid Function Tests: No results for input(s): TSH, T4TOTAL, FREET4, T3FREE, THYROIDAB in the last 72 hours. Anemia Panel: Recent Labs    10/20/19 1028  VITAMINB12 487  FOLATE 14.3  FERRITIN 63  TIBC 246*  IRON 27*  RETICCTPCT 3.2*   Sepsis Labs: Recent Labs  Lab 10/19/19 0519 10/19/19 1049 10/19/19 1529 10/20/19 0450  LATICACIDVEN 1.2 1.4 1.5 1.2    Recent Results (from the past 240 hour(s))  Respiratory Panel by RT PCR (Flu A&B, Covid) - Nasopharyngeal Swab     Status: None   Collection Time: 10/18/19 11:18 PM   Specimen: Nasopharyngeal Swab  Result Value Ref Range Status   SARS Coronavirus 2 by RT PCR NEGATIVE NEGATIVE Final    Comment: (NOTE) SARS-CoV-2 target nucleic acids are NOT DETECTED. The SARS-CoV-2 RNA is generally detectable in upper respiratoy specimens during the acute phase of infection. The lowest concentration  of SARS-CoV-2 viral copies this assay can detect is 131 copies/mL. A negative result does not preclude SARS-Cov-2 infection and should not be used as the sole basis for treatment or other patient management decisions. A negative result may occur with  improper specimen collection/handling, submission of specimen other than nasopharyngeal swab, presence of viral mutation(s) within the areas targeted by this assay, and inadequate number of viral copies (<131 copies/mL). A negative result must be combined with clinical observations, patient history, and epidemiological information. The expected result is Negative. Fact Sheet for Patients:  PinkCheek.be  Fact Sheet for Healthcare Providers:  GravelBags.it This test is not yet ap proved or cleared by the [place] FDA and  has been authorized for detection and/or diagnosis of SARS-CoV-2 by FDA under an Emergency Use Authorization (EUA). This EUA will remain  in effect (meaning this test can be used) for the duration of the COVID-19 declaration under Section 564(b)(1) of the Act, 21 U.S.C. section 360bbb-3(b)(1), unless the authorization is terminated or revoked sooner.    Influenza A by PCR NEGATIVE NEGATIVE Final   Influenza B by PCR NEGATIVE NEGATIVE Final    Comment: (NOTE) The Xpert Xpress SARS-CoV-2/FLU/RSV assay is intended as an aid in  the diagnosis of influenza from Nasopharyngeal swab specimens and  should not be used as a sole basis for treatment. Nasal washings and  aspirates are unacceptable for Xpert Xpress SARS-CoV-2/FLU/RSV  testing. Fact Sheet for Patients: PinkCheek.be Fact Sheet for Healthcare Providers: GravelBags.it This test is not yet approved or cleared by the [place] FDA and  has been authorized for detection and/or diagnosis of SARS-CoV-2 by  FDA under an Emergency Use Authorization (EUA).  This EUA will remain  in effect (meaning this test can be used) for the duration of the  Covid-19 declaration under Section 564(b)(1) of the Act, 21  U.S.C. section 360bbb-3(b)(1), unless the authorization is  terminated or revoked. Performed at [facility], [address] [place], [place] [address]   Blood culture (routine x 2)     Status: None (Preliminary result)   Collection Time: 10/19/19  3:12 AM   Specimen: BLOOD LEFT ARM  Result Value Ref Range Status   Specimen Description BLOOD LEFT ARM  Final   Special Requests   Final    BOTTLES DRAWN AEROBIC ONLY Blood Culture adequate volume   Culture   Final    NO GROWTH 1 DAY Performed at [facility], [address] [place], [place] [address]    Report Status PENDING  Incomplete  Blood culture (routine x 2)     Status: None (Preliminary result)   Collection Time: 10/19/19  5:19 AM   Specimen: Left Antecubital; Blood  Result Value Ref Range Status   Specimen Description LEFT ANTECUBITAL  Final   Special Requests NONE  Final   Culture   Final    NO GROWTH 1 DAY Performed at [facility], [address] [place], [place] [address]    Report Status PENDING  Incomplete  MRSA PCR Screening     Status: None   Collection Time: 10/19/19  6:17 AM   Specimen: Nasopharyngeal  Result Value Ref Range Status   MRSA by PCR NEGATIVE NEGATIVE Final    Comment:        The GeneXpert MRSA Assay (FDA approved for NASAL specimens only), is one component of a comprehensive MRSA colonization surveillance program. It is not intended to diagnose MRSA infection nor to guide or monitor treatment for MRSA infections. Performed at [facility], [address], [place],  [address]          Radiology Studies: CT ABDOMEN PELVIS W CONTRAST  Result Date: 10/19/2019 CLINICAL DATA:  Abdominal pain. Nausea and vomiting. Abdominal abscess/infection suspected EXAM: CT ABDOMEN AND PELVIS WITH CONTRAST TECHNIQUE: Multidetector CT imaging of the  abdomen and pelvis was performed using the standard protocol following bolus administration of intravenous contrast. CONTRAST:  155mL OMNIPAQUE IOHEXOL 300 MG/ML  SOLN COMPARISON:  Chest radiograph yesterday. Report from abdominopelvic CT 12/24/2015, images not available FINDINGS: Lower chest: Emphysema. Ground-glass  opacities in the right middle lobe are partially included. Multi chamber cardiomegaly with primarily right heart dilatation. Contrast refluxes into the hepatic veins and IVC. Hepatobiliary: Stones and sludge in the gallbladder without gallbladder inflammation. No biliary dilatation. Tiny subcentimeter hypodensity in the subcapsular right lobe of the liver is too small to characterize. Contrast refluxes into the hepatic veins and IVC. Pancreas: No ductal dilatation or inflammation. Spleen: Heterogeneous enhancement likely due to phase of contrast. Spleen is normal in size. Adrenals/Urinary Tract: Normal adrenal glands. No hydronephrosis. No perinephric edema. Small cyst in the posterior left kidney. Urinary bladder is markedly distended to the umbilicus. Bladder volume = 1500 cm^3. Bladder slightly protrudes into the right obturator fossa. No bladder wall thickening. Questionable diverticulum at the bladder dome. Stomach/Bowel: Fluid-filled stomach, with minimal fluid in the distal esophagus. There is no gastric wall thickening. Wall thickening with mucosal enhancement involving the fourth portion of the duodenum, and more prominently the proximal jejunum. Moderate length segment of small bowel involvement. No obstruction. There is associated mesenteric edema and free fluid. No pneumatosis or perforation. More distal small bowel are nondistended, slightly fluid-filled but noninflamed. There is small bowel interposition lateral to the right lobe of the liver. Colonic diverticulosis without diverticulitis. The appendix is not well visualized. There is no evidence of appendicitis. Vascular/Lymphatic:  Moderate aortic atherosclerosis. No aortic aneurysm. Portal vein is patent. The mesenteric artery and veins are patent. No gross adenopathy. Reproductive: Prostate is unremarkable. Other: Small to moderate volume of free fluid/ascites. Moderate mesenteric edema and free fluid. No free air. No intra-abdominal abscess. Musculoskeletal: Degenerative change in the spine. Avascular necrosis of the femoral heads without collapse. IMPRESSION: 1. Mark wall thickening with mucosal enhancement involving the fourth portion of the duodenum and proximal jejunum, with associated mesenteric edema and free fluid. Findings may represent infectious or inflammatory enteritis versus bowel angioedema. No evidence of bowel ischemia or perforation. 2. Markedly distended urinary bladder to the umbilicus. Questionable bladder dome diverticulum. Similar findings were described on prior exam, this may be chronic. 3. Gallstones and sludge in the gallbladder without gallbladder inflammation. 4. Colonic diverticulosis without diverticulitis. 5. Multi chamber cardiomegaly with primarily right heart dilatation, and contrast refluxing into the hepatic veins and IVC consistent with right heart failure. Aortic Atherosclerosis (ICD10-I70.0) and Emphysema (ICD10-J43.9). Electronically Signed   By: Keith Rake M.D.   On: 10/19/2019 03:01   DG Chest Portable 1 View  Result Date: 10/19/2019 CLINICAL DATA:  Shortness of breath. Nausea and vomiting. EXAM: PORTABLE CHEST 1 VIEW COMPARISON:  Radiograph 07/10/2018. FINDINGS: Patchy heterogeneous airspace opacities throughout the right right upper and midlung zones. Possible minimal fluid in the right minor fissure. No definite airspace disease in the left lung. Mild cardiomegaly is unchanged. Mild aortic atherosclerosis. No pneumothorax. Chronic hyperinflation. No acute osseous abnormalities are seen. IMPRESSION: 1. Patchy heterogeneous airspace opacities in the right upper and midlung zones,  suspicious for pneumonia. Unilateral involvement more typical of broncho pneumonia rather than COVID. 2. Stable mild cardiomegaly and hyperinflation. Electronically Signed   By: Keith Rake M.D.   On: 10/19/2019 00:23   ECHOCARDIOGRAM COMPLETE  Result Date: 10/19/2019   ECHOCARDIOGRAM REPORT   Patient Name:   Blake Wise Date of Exam: 10/19/2019 Medical Rec #:  [id number]    Height:       71.0 in Accession #:    [id number]   Weight:       124.1 lb Date of Birth:  1946/08/01   BSA:  1.72 m Patient Age:    64 years     BP:           152/84 mmHg Patient Gender: M            HR:           112 bpm. Exam Location:  [facility] Procedure: 2D Echo Indications:    atrial fibrillation  History:        Patient has no prior history of Echocardiogram examinations.                 CHF, COPD, Arrythmias:Atrial Fibrillation; Risk                 Factors:Diabetes, Hypertension and Former Smoker.  Sonographer:    Jannett Celestine RDCS (AE) Referring Phys: [place]  1. Left ventricular ejection fraction, by visual estimation, is 50 to 55%. The left ventricle has low normal function. There is no left ventricular hypertrophy.  2. Left ventricular diastolic parameters are indeterminate.  3. Right ventricular volume and pressure overload.  4. The left ventricle has no regional wall motion abnormalities.  5. Global right ventricle has mildly to moderately reduced systolic function.The right ventricular size is severely enlarged. No increase in right ventricular wall thickness.  6. Left atrial size was normal.  7. Right atrial size was severely dilated.  8. The mitral valve is grossly normal. Moderate mitral valve regurgitation.  9. The tricuspid valve is grossly normal. 10. The aortic valve is tricuspid. Aortic valve regurgitation is not visualized. Mild aortic valve sclerosis without stenosis. 11. The pulmonic valve was not well visualized. Pulmonic valve regurgitation is trivial. 12. Severely  elevated pulmonary artery systolic pressure. 13. The tricuspid regurgitant velocity is 4.03 m/s, and with an assumed right atrial pressure of 3 mmHg, the estimated right ventricular systolic pressure is severely elevated at 68.0 mmHg. 14. The inferior vena cava is normal in size with greater than 50% respiratory variability, suggesting right atrial pressure of 3 mmHg. FINDINGS  Left Ventricle: Left ventricular ejection fraction, by visual estimation, is 50 to 55%. The left ventricle has low normal function. The left ventricle has no regional wall motion abnormalities. The left ventricular internal cavity size was the left ventricle is normal in size. There is no left ventricular hypertrophy. The interventricular septum is flattened in systole and diastole, consistent with right ventricular pressure and volume overload. Left ventricular diastolic parameters are indeterminate. Right Ventricle: The right ventricular size is severely enlarged. No increase in right ventricular wall thickness. Global RV systolic function is has mildly reduced systolic function. The tricuspid regurgitant velocity is 4.03 m/s, and with an assumed right atrial pressure of 3 mmHg, the estimated right ventricular systolic pressure is severely elevated at 68.0 mmHg. Left Atrium: Left atrial size was normal in size. Right Atrium: Right atrial size was severely dilated Pericardium: There is no evidence of pericardial effusion. Mitral Valve: The mitral valve is grossly normal. Moderate mitral valve regurgitation. Tricuspid Valve: The tricuspid valve is grossly normal. Tricuspid valve regurgitation moderate-severe. Aortic Valve: The aortic valve is tricuspid. Aortic valve regurgitation is not visualized. Mild aortic valve sclerosis is present, with no evidence of aortic valve stenosis. Pulmonic Valve: The pulmonic valve was not well visualized. Pulmonic valve regurgitation is trivial. Pulmonic regurgitation is trivial. Aorta: The aortic root is  normal in size and structure. Venous: The inferior vena cava is normal in size with greater than 50% respiratory variability, suggesting right atrial pressure of 3  mmHg. IAS/Shunts: No atrial level shunt detected by color flow Doppler.  LEFT VENTRICLE PLAX 2D LVIDd:         3.56 cm LVIDs:         2.66 cm LV PW:         0.98 cm LV IVS:        0.78 cm LVOT diam:     2.40 cm LV SV:         27 ml LV SV Index:   16.22 LVOT Area:     4.52 cm  RIGHT VENTRICLE RV S prime:     17.30 cm/s TAPSE (M-mode): 1.5 cm LEFT ATRIUM           Index       RIGHT ATRIUM           Index LA diam:      3.80 cm 2.21 cm/m  RA Area:     19.00 cm LA Vol (A4C): 32.8 ml 19.05 ml/m RA Volume:   50.00 ml  29.04 ml/m  AORTIC VALVE LVOT Vmax:   65.00 cm/s LVOT Vmean:  51.100 cm/s LVOT VTI:    0.143 m  AORTA Ao Root diam: 2.70 cm MITRAL VALVE                       TRICUSPID VALVE MV Area (PHT): 2.76 cm            TR Peak grad:   65.0 mmHg MV PHT:        79.75 msec          TR Vmax:        403.00 cm/s MV Decel Time: 275 msec MV E velocity: 60.60 cm/s 103 cm/s SHUNTS                                    Systemic VTI:  0.14 m                                    Systemic Diam: 2.40 cm  Rozann Lesches MD Electronically signed by Rozann Lesches MD Signature Date/Time: 10/19/2019/12:58:01 PM    Final         Scheduled Meds: . atorvastatin  10 mg Oral Daily  . Chlorhexidine Gluconate Cloth  6 each Topical Daily  . diltiazem  60 mg Oral Q6H  . insulin aspart  0-6 Units Subcutaneous TID WC  . mouth rinse  15 mL Mouth Rinse BID  . methocarbamol  500 mg Oral QID  . metoprolol tartrate  50 mg Oral BID  . pantoprazole (PROTONIX) IV  40 mg Intravenous Q12H  . sodium bicarbonate  650 mg Oral QID  . sodium chloride flush  3 mL Intravenous Q12H   Continuous Infusions: . sodium chloride    . azithromycin Stopped (10/20/19 0435)  . ferumoxytol    . lactated ringers 75 mL/hr at 10/20/19 1100  . piperacillin-tazobactam (ZOSYN)  IV 3.375 g  (10/20/19 0629)     LOS: 1 day    Time spent: 61 minutes    Tessah Patchen Darleen Crocker, DO [facility] Pager [phone]  If 7PM-7AM, please contact night-coverage [email or web address] Password [facility] 10/20/2019, 12:53 PM

## 2019-10-20 NOTE — Progress Notes (Signed)
Hypoglycemic Event  CBG: 65  Treatment: 4 oz juice/soda  Symptoms: None  Follow-up CBG: [id number] CBG Result 80  Possible Reasons for Event: Inadequate meal intake  Comments/MD notified: n/a    [facility]

## 2019-10-20 NOTE — Progress Notes (Signed)
Patient's systolic BP in low to mid 80's range. Heart rate 65-75. Cardizem drip currently on hold. MD aware.

## 2019-10-20 NOTE — Progress Notes (Signed)
ANTICOAGULATION CONSULT NOTE -   Pharmacy Consult for warfarin dosing  Indication: atrial fibrillation   No Known Allergies    Patient Measurements: Last Weight  Most recent update: 10/20/2019  5:45 AM   Weight  54.7 kg (120 lb 9.5 oz)           Body mass index is 16.82 kg/m. Blake Wise               Temp: 97.7 F (36.5 C) (01/04 0802) Temp Source: Axillary (01/04 0802) BP: 100/72 (01/04 0800) Pulse Rate: 89 (01/04 0802)  Labs: Recent Labs    10/18/19 2318 10/18/19 2319 10/18/19 2355 10/20/19 0450  HGB 11.0*  --   --  7.2*  HCT 34.3*  --   --  22.4*  PLT 349  --   --  236  LABPROT  --  81.3*  --  21.0*  INR  --  10.2*  --  1.8*  CREATININE  --   --  1.61* 1.59*    Estimated Creatinine Clearance: 31.5 mL/min (A) (by C-G formula based on SCr of 1.59 mg/dL (H)).     Medications:  Medications Prior to Admission  Medication Sig Dispense Refill Last Dose  . albuterol (VENTOLIN HFA) 108 (90 Base) MCG/ACT inhaler Inhale into the lungs.   10/18/2019 at Unknown time  . atorvastatin (LIPITOR) 10 MG tablet Take 10 mg by mouth daily.   10/17/2019 at Unknown time  . Cholecalciferol 25 MCG (1000 UT) tablet Take by mouth.   10/17/2019  . diltiazem (TIAZAC) 240 MG 24 hr capsule Take 240 mg by mouth daily.   10/17/2019  . furosemide (LASIX) 20 MG tablet Take 20 mg by mouth daily.   10/17/2019 at Unknown time  . glimepiride (AMARYL) 1 MG tablet Take 1 mg by mouth daily.   10/17/2019  . hydrALAZINE (APRESOLINE) 10 MG tablet Take 10 mg by mouth 3 (three) times daily.   10/17/2019 at Unknown time  . ipratropium-albuterol (DUONEB) 0.5-2.5 (3) MG/3ML SOLN Inhale 3 mLs into the lungs every 6 (six) hours as needed (breathing).    10/17/2019  . isosorbide mononitrate (IMDUR) 30 MG 24 hr tablet Take 30 mg by mouth daily.   10/17/2019 at Unknown time  . metoprolol tartrate (LOPRESSOR) 50 MG tablet Take 50 mg by mouth 2 (two) times daily.   10/17/2019 at Unknown time  . Multiple Vitamins-Minerals (MENS ONE  DAILY PO) Take 1 tablet by mouth daily.   10/17/2019  . Omega-3 Fatty Acids (FISH OIL) 1000 MG CAPS Take 1 capsule by mouth daily.    10/17/2019  . sitaGLIPtin (JANUVIA) 50 MG tablet Take 50 mg by mouth daily.   10/17/2019 at Unknown time  . sodium bicarbonate 650 MG tablet Take 650 mg by mouth 4 (four) times daily.   10/17/2019 at Unknown time  . SPIRIVA HANDIHALER 18 MCG inhalation capsule 1 capsule daily.   10/17/2019  . SYMBICORT 160-4.5 MCG/ACT inhaler Inhale 1 puff into the lungs daily.    10/17/2019  . warfarin (COUMADIN) 2 MG tablet Take 2 mg by mouth See admin instructions. Take 2 mg tablet every Wed, Thurs, Friday, and Saturday along with 4 mg for a total of 6 mg   10/17/2019  . warfarin (COUMADIN) 4 MG tablet Take 4 mg by mouth See admin instructions. Take 1 tablet daily.  Take along with 2 mg tablet on Wed, Thurs, Friday, and Saturday for a total of 6 mg.   10/17/2019 at Unknown time  .  COLCRYS 0.6 MG tablet Take 0.6 mg by mouth 2 (two) times daily.   unknown  . lisinopril (ZESTRIL) 10 MG tablet Take 10 mg by mouth daily.    verify  . methocarbamol (ROBAXIN) 500 MG tablet Take 500 mg by mouth 4 (four) times daily.   Not Taking at Unknown time  . mirtazapine (REMERON) 15 MG tablet Take 15 mg by mouth at bedtime.    verify   Scheduled:  . atorvastatin  10 mg Oral Daily  . Chlorhexidine Gluconate Cloth  6 each Topical Daily  . diltiazem  60 mg Oral Q6H  . insulin aspart  0-9 Units Subcutaneous TID WC  . mouth rinse  15 mL Mouth Rinse BID  . methocarbamol  500 mg Oral QID  . metoprolol tartrate  50 mg Oral BID  . pantoprazole (PROTONIX) IV  40 mg Intravenous Q12H  . sodium bicarbonate  650 mg Oral QID  . sodium chloride flush  3 mL Intravenous Q12H   Infusions:  . sodium chloride    . azithromycin 500 mg (10/20/19 0335)  . diltiazem (CARDIZEM) infusion Stopped (10/20/19 0100)  . lactated ringers 75 mL/hr at 10/20/19 0935  . piperacillin-tazobactam (ZOSYN)  IV 3.375 g (10/20/19 0629)  .  potassium chloride 10 mEq (10/20/19 0933)   PRN: sodium chloride, acetaminophen **OR** acetaminophen, bisacodyl, ondansetron **OR** ondansetron (ZOFRAN) IV, polyethylene glycol, sodium chloride flush Anti-infectives (From admission, onward)   Start     Dose/Rate Route Frequency Ordered Stop   10/20/19 0200  azithromycin (ZITHROMAX) 500 mg in sodium chloride 0.9 % 250 mL IVPB     500 mg 250 mL/hr over 60 Minutes Intravenous Every 24 hours 10/19/19 0622     10/19/19 1400  piperacillin-tazobactam (ZOSYN) IVPB 3.375 g     3.375 g 12.5 mL/hr over 240 Minutes Intravenous Every 8 hours 10/19/19 0635     10/19/19 0700  piperacillin-tazobactam (ZOSYN) IVPB 3.375 g     3.375 g 100 mL/hr over 30 Minutes Intravenous  Once 10/19/19 0635 10/19/19 0810   10/19/19 0315  metroNIDAZOLE (FLAGYL) IVPB 500 mg     500 mg 100 mL/hr over 60 Minutes Intravenous  Once 10/19/19 0314 10/19/19 0431   10/19/19 0100  cefTRIAXone (ROCEPHIN) 1 g in sodium chloride 0.9 % 100 mL IVPB     1 g 200 mL/hr over 30 Minutes Intravenous  Once 10/19/19 0053 10/19/19 0215   10/19/19 0100  azithromycin (ZITHROMAX) 500 mg in sodium chloride 0.9 % 250 mL IVPB     500 mg 250 mL/hr over 60 Minutes Intravenous  Once 10/19/19 0053 10/19/19 0227      Goal of Therapy:  INR 2-3 Monitor platelets by anticoagulation protocol: Yes    Prior to Admission Warfarin Dosing:  Blake Wise takes 6mg  of warfarin daily       Admit INR was 10.2 Lab Results  Component Value Date   INR 1.8 (H) 10/20/2019   INR 10.2 (HH) 10/18/2019    Assessment: Blake Wise a 74 y.o. male requires anticoagulation with warfarin for the indication of  atrial fibrillation. Warfarin will be initiated inpatient following pharmacy protocol per pharmacy consult.   Vitamin k 2.5mg  po give 1/3 am . Hemoglobin drop of 11>> 7.2. MD aware and plan to hold coumadin today. Repeating labs. INR has trended down 10.2>> 1.8.    Plan: Hold warfarin today Monitor CBC  daily with am labs   Monitor INR daily Monitor for signs and symptoms of bleeding   Blake Wise  Blake Wise, BS Pharm D, [place] Clinical Pharmacist Pager [phone]  10/20/2019 [phone]

## 2019-10-20 NOTE — Progress Notes (Signed)
Inpatient Diabetes Program Recommendations  AACE/ADA: New Consensus Statement on Inpatient Glycemic Control (2015)  Target Ranges:  Prepandial:   less than 140 mg/dL      Peak postprandial:   less than 180 mg/dL (1-2 hours)      Critically ill patients:  140 - 180 mg/dL   Lab Results  Component Value Date   GLUCAP 92 10/20/2019    Review of Glycemic Control Results for Blake Wise, Blake Wise (MRN [id number]) as of 10/20/2019 11:49  Ref. Range 10/19/2019 21:24 10/20/2019 07:57 10/20/2019 08:33 10/20/2019 11:29  Glucose-Capillary Latest Ref Range: 70 - 99 mg/dL 85 65 (L) 80 92   Diabetes history: Type 2 DM Outpatient Diabetes medications: Januvia 50 mg QD, Amaryl 1 mg QD Current orders for Inpatient glycemic control: Novolog 0-9 units TID A1C pending  Inpatient Diabetes Program Recommendations:    FSBG 65 mg/dL, consider decreasing correction to Novolog 0-6 units TID.   Thanks, Bronson Curb, MSN, RNC-OB Diabetes Coordinator [phone] (8a-5p)

## 2019-10-21 DIAGNOSIS — I5032 Chronic diastolic (congestive) heart failure: Secondary | ICD-10-CM

## 2019-10-21 DIAGNOSIS — R339 Retention of urine, unspecified: Secondary | ICD-10-CM

## 2019-10-21 DIAGNOSIS — R791 Abnormal coagulation profile: Secondary | ICD-10-CM

## 2019-10-21 DIAGNOSIS — J189 Pneumonia, unspecified organism: Secondary | ICD-10-CM

## 2019-10-21 DIAGNOSIS — D62 Acute posthemorrhagic anemia: Secondary | ICD-10-CM

## 2019-10-21 DIAGNOSIS — I10 Essential (primary) hypertension: Secondary | ICD-10-CM

## 2019-10-21 LAB — HEMOGLOBIN A1C
Hgb A1c MFr Bld: 5.8 % — ABNORMAL HIGH (ref 4.8–5.6)
Hgb A1c MFr Bld: 5.8 % — ABNORMAL HIGH (ref 4.8–5.6)
Mean Plasma Glucose: 120 mg/dL
Mean Plasma Glucose: 120 mg/dL

## 2019-10-21 LAB — BASIC METABOLIC PANEL
Anion gap: 12 (ref 5–15)
BUN: 27 mg/dL — ABNORMAL HIGH (ref 8–23)
CO2: 25 mmol/L (ref 22–32)
Calcium: 7.9 mg/dL — ABNORMAL LOW (ref 8.9–10.3)
Chloride: 106 mmol/L (ref 98–111)
Creatinine, Ser: 1.73 mg/dL — ABNORMAL HIGH (ref 0.61–1.24)
GFR calc Af Amer: 44 mL/min — ABNORMAL LOW (ref >60)
GFR calc non Af Amer: 38 mL/min — ABNORMAL LOW (ref >60)
Glucose, Bld: 113 mg/dL — ABNORMAL HIGH (ref 70–99)
Potassium: 4.1 mmol/L (ref 3.5–5.1)
Sodium: 143 mmol/L (ref 135–145)

## 2019-10-21 LAB — CBC
HCT: 24.2 % — ABNORMAL LOW (ref 39.0–52.0)
Hemoglobin: 7.4 g/dL — ABNORMAL LOW (ref 13.0–17.0)
MCH: 30.7 pg (ref 26.0–34.0)
MCHC: 30.6 g/dL (ref 30.0–36.0)
MCV: 100.4 fL — ABNORMAL HIGH (ref 80.0–100.0)
Platelets: 246 10*3/uL (ref 150–400)
RBC: 2.41 MIL/uL — ABNORMAL LOW (ref 4.22–5.81)
RDW: 17.5 % — ABNORMAL HIGH (ref 11.5–15.5)
WBC: 12.4 10*3/uL — ABNORMAL HIGH (ref 4.0–10.5)
nRBC: 0.3 % — ABNORMAL HIGH (ref 0.0–0.2)

## 2019-10-21 LAB — GLUCOSE, CAPILLARY
Glucose-Capillary: 80 mg/dL (ref 70–99)
Glucose-Capillary: 132 mg/dL — ABNORMAL HIGH (ref 70–99)
Glucose-Capillary: 105 mg/dL — ABNORMAL HIGH (ref 70–99)
Glucose-Capillary: 129 mg/dL — ABNORMAL HIGH (ref 70–99)

## 2019-10-21 LAB — MAGNESIUM: Magnesium: 2.2 mg/dL (ref 1.7–2.4)

## 2019-10-21 MED ORDER — WARFARIN SODIUM 5 MG PO TABS: 6.0000 mg | ORAL_TABLET | Freq: Once | ORAL | Status: DC

## 2019-10-21 MED ORDER — GUAIFENESIN ER 600 MG PO TB12
1200.0000 mg | ORAL_TABLET | Freq: Two times a day (BID) | ORAL | Status: DC
  Administered 2019-10-21 – 2019-10-28 (×14): 1200 mg via ORAL
  Filled 2019-10-21 (×15): qty 2

## 2019-10-21 MED ORDER — DILTIAZEM HCL 30 MG PO TABS
30.0000 mg | ORAL_TABLET | Freq: Four times a day (QID) | ORAL | Status: DC
  Administered 2019-10-21 – 2019-10-22 (×3): 30 mg via ORAL
  Filled 2019-10-21 (×4): qty 1

## 2019-10-21 MED ORDER — IPRATROPIUM-ALBUTEROL 0.5-2.5 (3) MG/3ML IN SOLN
3.0000 mL | Freq: Four times a day (QID) | RESPIRATORY_TRACT | Status: DC
  Administered 2019-10-21 – 2019-10-28 (×27): 3 mL via RESPIRATORY_TRACT
  Filled 2019-10-21 (×28): qty 3

## 2019-10-21 MED ORDER — SALINE SPRAY 0.65 % NA SOLN
1.0000 | NASAL | Status: DC | PRN
  Administered 2019-10-21: 1 via NASAL
  Filled 2019-10-21: qty 44

## 2019-10-21 MED ORDER — WARFARIN SODIUM 2 MG PO TABS
4.0000 mg | ORAL_TABLET | Freq: Once | ORAL | Status: AC
  Administered 2019-10-21: 20:00:00 4 mg via ORAL
  Filled 2019-10-21: qty 2

## 2019-10-21 MED ORDER — WARFARIN - PHARMACIST DOSING INPATIENT: Freq: Every day | Status: DC

## 2019-10-21 NOTE — Progress Notes (Signed)
Patient's blood pressure dropping following cardizem administration. MD aware.

## 2019-10-21 NOTE — Progress Notes (Signed)
Spoke with MD about foley. Pt has blood clots in tubing of foley today. Updated MD. Advised to keep foley until clots cleared. Will continue to monitor.

## 2019-10-21 NOTE — Progress Notes (Signed)
ANTICOAGULATION CONSULT NOTE -   Pharmacy Consult for warfarin dosing  Indication: atrial fibrillation   No Known Allergies    Patient Measurements: Last Weight  Most recent update: 10/21/2019  6:29 AM   Weight  54.1 kg (119 lb 4.3 oz)           Body mass index is 16.63 kg/m. Blake Wise               Temp: 99.2 F (37.3 C) (01/05 1644) Temp Source: Oral (01/05 1644) BP: 144/85 (01/05 1830) Pulse Rate: 97 (01/05 1830)  Labs: Recent Labs    10/18/19 2318 10/18/19 2319 10/18/19 2355 10/20/19 0450 10/20/19 1028 10/21/19 0343  HGB 11.0*  --   --  7.2* 7.2* 7.4*  HCT 34.3*  --   --  22.4* 23.2* 24.2*  PLT 349  --   --  236  --  246  LABPROT  --  81.3*  --  21.0*  --   --   INR  --  10.2*  --  1.8*  --   --   CREATININE  --   --  1.61* 1.59*  --  1.73*    Estimated Creatinine Clearance: 28.7 mL/min (A) (by C-G formula based on SCr of 1.73 mg/dL (H)).     Medications:  Medications Prior to Admission  Medication Sig Dispense Refill Last Dose  . albuterol (VENTOLIN HFA) 108 (90 Base) MCG/ACT inhaler Inhale into the lungs.   10/18/2019 at Unknown time  . atorvastatin (LIPITOR) 10 MG tablet Take 10 mg by mouth daily.   10/17/2019 at Unknown time  . Cholecalciferol 25 MCG (1000 UT) tablet Take by mouth.   10/17/2019  . diltiazem (TIAZAC) 240 MG 24 hr capsule Take 240 mg by mouth daily.   10/17/2019  . furosemide (LASIX) 20 MG tablet Take 20 mg by mouth daily.   10/17/2019 at Unknown time  . glimepiride (AMARYL) 1 MG tablet Take 1 mg by mouth daily.   10/17/2019  . hydrALAZINE (APRESOLINE) 10 MG tablet Take 10 mg by mouth 3 (three) times daily.   10/17/2019 at Unknown time  . ipratropium-albuterol (DUONEB) 0.5-2.5 (3) MG/3ML SOLN Inhale 3 mLs into the lungs every 6 (six) hours as needed (breathing).    10/17/2019  . isosorbide mononitrate (IMDUR) 30 MG 24 hr tablet Take 30 mg by mouth daily.   10/17/2019 at Unknown time  . lisinopril (ZESTRIL) 10 MG tablet Take 10 mg by mouth daily.     10/17/2019  . metoprolol tartrate (LOPRESSOR) 50 MG tablet Take 50 mg by mouth 2 (two) times daily.   10/17/2019 at Unknown time  . mirtazapine (REMERON) 15 MG tablet Take 15 mg by mouth at bedtime.    10/17/2019  . Multiple Vitamins-Minerals (MENS ONE DAILY PO) Take 1 tablet by mouth daily.   10/17/2019  . Omega-3 Fatty Acids (FISH OIL) 1000 MG CAPS Take 1 capsule by mouth daily.    10/17/2019  . sitaGLIPtin (JANUVIA) 50 MG tablet Take 50 mg by mouth daily.   10/17/2019 at Unknown time  . sodium bicarbonate 650 MG tablet Take 650 mg by mouth 4 (four) times daily.   10/17/2019 at Unknown time  . SPIRIVA HANDIHALER 18 MCG inhalation capsule 1 capsule daily.   10/17/2019  . SYMBICORT 160-4.5 MCG/ACT inhaler Inhale 1 puff into the lungs daily.    10/17/2019  . warfarin (COUMADIN) 2 MG tablet Take 2 mg by mouth See admin instructions. Take 2 mg tablet every  Wed, Thurs, Friday, and Saturday along with 4 mg for a total of 6 mg   10/17/2019  . warfarin (COUMADIN) 4 MG tablet Take 4 mg by mouth See admin instructions. Take 1 tablet daily.  Take along with 2 mg tablet on Wed, Thurs, Friday, and Saturday for a total of 6 mg.   10/17/2019 at Unknown time  . COLCRYS 0.6 MG tablet Take 0.6 mg by mouth 2 (two) times daily.   unknown  . diltiazem (TIAZAC) 360 MG 24 hr capsule Take 360 mg by mouth daily.     . methocarbamol (ROBAXIN) 500 MG tablet Take 500 mg by mouth 4 (four) times daily.   Not Taking at Unknown time   Scheduled:  . atorvastatin  10 mg Oral Daily  . Chlorhexidine Gluconate Cloth  6 each Topical Daily  . diltiazem  30 mg Oral Q6H  . feeding supplement  1 Container Oral TID WC & HS  . guaiFENesin  1,200 mg Oral BID  . insulin aspart  0-6 Units Subcutaneous TID WC  . ipratropium-albuterol  3 mL Nebulization Q6H  . mouth rinse  15 mL Mouth Rinse BID  . methocarbamol  500 mg Oral QID  . metoprolol tartrate  50 mg Oral BID  . pantoprazole (PROTONIX) IV  40 mg Intravenous Q12H  . sodium bicarbonate  650 mg Oral QID   . sodium chloride flush  3 mL Intravenous Q12H  . warfarin  6 mg Oral ONCE-1800  . [START ON 10/22/2019] Warfarin - Pharmacist Dosing Inpatient   Does not apply q1800   Infusions:  . sodium chloride    . azithromycin Stopped (10/21/19 0226)  . lactated ringers 75 mL/hr at 10/21/19 1700  . piperacillin-tazobactam (ZOSYN)  IV 12.5 mL/hr at 10/21/19 1700   PRN: sodium chloride, acetaminophen **OR** acetaminophen, bisacodyl, levalbuterol, ondansetron **OR** ondansetron (ZOFRAN) IV, polyethylene glycol, sodium chloride, sodium chloride flush Anti-infectives (From admission, onward)   Start     Dose/Rate Route Frequency Ordered Stop   10/20/19 0200  azithromycin (ZITHROMAX) 500 mg in sodium chloride 0.9 % 250 mL IVPB     500 mg 250 mL/hr over 60 Minutes Intravenous Every 24 hours 10/19/19 0622     10/19/19 1400  piperacillin-tazobactam (ZOSYN) IVPB 3.375 g     3.375 g 12.5 mL/hr over 240 Minutes Intravenous Every 8 hours 10/19/19 0635     10/19/19 0700  piperacillin-tazobactam (ZOSYN) IVPB 3.375 g     3.375 g 100 mL/hr over 30 Minutes Intravenous  Once 10/19/19 0635 10/19/19 0810   10/19/19 0315  metroNIDAZOLE (FLAGYL) IVPB 500 mg     500 mg 100 mL/hr over 60 Minutes Intravenous  Once 10/19/19 0314 10/19/19 0431   10/19/19 0100  cefTRIAXone (ROCEPHIN) 1 g in sodium chloride 0.9 % 100 mL IVPB     1 g 200 mL/hr over 30 Minutes Intravenous  Once 10/19/19 0053 10/19/19 0215   10/19/19 0100  azithromycin (ZITHROMAX) 500 mg in sodium chloride 0.9 % 250 mL IVPB     500 mg 250 mL/hr over 60 Minutes Intravenous  Once 10/19/19 0053 10/19/19 0227      Goal of Therapy:  INR 2-3 Monitor platelets by anticoagulation protocol: Yes    Prior to Admission Warfarin Dosing:  Blake Wise takes 6mg  of warfarin daily       Admit INR was 10.2 Lab Results  Component Value Date   INR 1.8 (H) 10/20/2019   INR 10.2 (HH) 10/18/2019    Assessment: Blake Wise  a 74 y.o. male requires anticoagulation  with warfarin for the indication of  atrial fibrillation. Warfarin will be initiated inpatient following pharmacy protocol per pharmacy consult.   Vitamin k 2.5mg  po give 1/3 am . Hemoglobin drop of 11>> 7.2. MD aware and plan to hold coumadin today. Repeating labs. INR has trended down 10.2>> 1.8.    Plan: Warfarin 4mg  po x 1  Monitor CBC daily with am labs   Monitor INR daily Monitor for signs and symptoms of bleeding   Donna Christen Gabrial Domine, PharmD, MBA, BCGP Clinical Pharmacist

## 2019-10-21 NOTE — Progress Notes (Signed)
PROGRESS NOTE    Blake Wise  UUV:[id number] DOB: 1946/01/23 DOA: 10/18/2019 PCP: Patient, No Pcp Per   Brief Narrative:  Per HPI: Blake Wise a 74 y.o.malewith medical history significant ofatrial fibrillation, congestive heart failure, COPD, hypertension, hyperlipidemia, diabetes mellitus type 2 who presented to the ER with worsening shortness of breath, abdominal pain. Patient is states he has been having left upper quadrant abdominal pain for the last 1 week. He is also had multiple episodes of vomiting. Patient reports progressively worsening shortness of breath, described as dyspnea on exertion.He is also had a cough with bloody sputum over the last 2 days. Today he had palpitations which persisted through the day. No chest pain reported. No fever, chills. No hematemesis, melena, hematochezia, dysuria, diarrhea, syncope, seizures, dizziness, lightheadedness, focal deficits, blurring of vision.  1/3: Patient has been admitted with sepsis secondary to enteritis as well as possibly aspiration pneumonia versus community-acquired pneumonia.  He has associated atrial fibrillation with RVR along with supratherapeutic INR he has been given some vitamin K.  His heart rates are currently improving on Cardizem drip, but he is noted to have multiple PVCs on telemetry.  He denies any chest pain, palpitations, or shortness of breath and is currently on 3 L nasal cannula.  1/4: Patient is noted to have acute anemia with hemoglobin down to 7.2 this morning.  No overt bleeding identified, however patient does describe to dark bowel movements prior to admission.  He states that he slightly nauseated as well.  He will be started on Protonix IV twice daily with stool occult ordered and pending.  Anemia panel with iron deficiency and he will be given some Feraheme.  Continue monitor CBC and transfuse for hemoglobin less than 7.  Heart rate appears stable at this time and patient is off Cardizem drip.   Blood pressures are soft and he will receive a IV fluid bolus.  He has been transitioned to oral Cardizem every 6 hours for now.  GI consulted for further evaluation of GI bleed.  Transition to clear liquid diet with n.p.o. after midnight.  Assessment & Plan:   Principal Problem:   Atrial fibrillation with RVR (HCC) Active Problems:   Chronic diastolic CHF (congestive heart failure) (HCC)   Chest pain   DM2 (diabetes mellitus, type 2) (HCC)   HTN (hypertension)   Acute urinary retention   Elevated INR   Community acquired pneumonia   Enteritis   Acute blood loss anemia   Sepsis-multifactorial with enteritis as well as right-sided pneumonia with concern for aspiration-improving -Continue on Zosyn and azithromycin as ordered -Continue on gentle IV fluid for now -Blood culture with no growth x2 days  Atrial fibrillation with RVR secondary to above-resolved -IV Cardizem drip discontinued and patient transition to oral Cardizem 30 mg every 6 hours  -Continue on home metoprolol -2D echo with LVEF 50-55% -Patient had supratherapeutic INR which has now resolved -Resume on Coumadin today.  Acute anemia-suspect blood loss -Patient with 2 dark bowel movements per admission and had a significant drop in hemoglobin levels with soft blood pressure readings -Unclear if this is truly related to blood loss versus hemodilution -Seen by GI with plans for outpatient endoscopy unless he has gross bleeding or transfusion dependent anemia -Hemoglobin is currently stable -Received a dose of IV iron -Discussed with Dr. Oneida Alar and will restart on Coumadin  Mild acute on chronic hypoxemic respiratory failure -Currently on 3 L and baseline 2 L at home -Wean as tolerated  Supratherapeutic INR-resolved -Appreciate  pharmacy management -Given vitamin K on 1/3  Troponin elevation likely secondary to demand ischemia -No chest pain or findings of ACS on EKG  Chronic diastolic congestive heart  failure -No volume overload currently noted -Follow daily weights -Repeat 2D echocardiogram with noted LVEF 50-55% -Lasix on hold due to sepsis physiology, will need to be reinitiated once this resolves  Hypertension-stable -Continue metoprolol as well as Cardizem drip and monitor carefully  Type 2 diabetes -A1c 5.8 -Hold Januvia for now -Blood sugars stable  Chronic COPD on home 2 L nasal cannula oxygen -No acute exacerbation currently noted -Continue bronchodilators   DVT prophylaxis: Coumadin with INR 1.8.  Code Status: Full Family Communication: None at bedside Disposition Plan:  Continue careful monitoring and transfuse as needed.  Continue current antibiotics.  Monitor heart rate and blood pressure on current medications.   Consultants:   GI  Procedures:   None  Antimicrobials:  Anti-infectives (From admission, onward)   Start     Dose/Rate Route Frequency Ordered Stop   10/20/19 0200  azithromycin (ZITHROMAX) 500 mg in sodium chloride 0.9 % 250 mL IVPB     500 mg 250 mL/hr over 60 Minutes Intravenous Every 24 hours 10/19/19 0622     10/19/19 1400  piperacillin-tazobactam (ZOSYN) IVPB 3.375 g     3.375 g 12.5 mL/hr over 240 Minutes Intravenous Every 8 hours 10/19/19 0635     10/19/19 0700  piperacillin-tazobactam (ZOSYN) IVPB 3.375 g     3.375 g 100 mL/hr over 30 Minutes Intravenous  Once 10/19/19 0635 10/19/19 0810   10/19/19 0315  metroNIDAZOLE (FLAGYL) IVPB 500 mg     500 mg 100 mL/hr over 60 Minutes Intravenous  Once 10/19/19 0314 10/19/19 0431   10/19/19 0100  cefTRIAXone (ROCEPHIN) 1 g in sodium chloride 0.9 % 100 mL IVPB     1 g 200 mL/hr over 30 Minutes Intravenous  Once 10/19/19 0053 10/19/19 0215   10/19/19 0100  azithromycin (ZITHROMAX) 500 mg in sodium chloride 0.9 % 250 mL IVPB     500 mg 250 mL/hr over 60 Minutes Intravenous  Once 10/19/19 0053 10/19/19 0227      Subjective: Patient denies any chest pain.  He does feel somewhat  short of breath.  Feels that this is improving.  He describes dark stool.  Staff reports that stool was dark green.  Objective: Vitals:   10/21/19 1700 10/21/19 1730 10/21/19 1800 10/21/19 1830  BP: (!) 144/93 (!) 141/97 (!) 130/95 (!) 144/85  Pulse: 92 90 87 97  Resp: 19 (!) 21 (!) 22 (!) 21  Temp:      TempSrc:      SpO2: 93% 94% 94% 93%  Weight:      Height:        Intake/Output Summary (Last 24 hours) at 10/21/2019 1944 Last data filed at 10/21/2019 1700 Gross per 24 hour  Intake 2559.3 ml  Output 1250 ml  Net 1309.3 ml   Filed Weights   10/19/19 0600 10/20/19 0500 10/21/19 0500  Weight: 56.3 kg 54.7 kg 54.1 kg    Examination:  General exam: Alert, awake, oriented x 3 Respiratory system: Mild rhonchi wheeze bilaterally. Respiratory effort normal. Cardiovascular system:RRR. No murmurs, rubs, gallops. Gastrointestinal system: Abdomen is nondistended, soft and nontender. No organomegaly or masses felt. Normal bowel sounds heard. Central nervous system: Alert and oriented. No focal neurological deficits. Extremities: No C/C/E, +pedal pulses Skin: No rashes, lesions or ulcers Psychiatry: Judgement and insight appear normal. Mood & affect appropriate.  Data Reviewed: I have personally reviewed following labs and imaging studies  CBC: Recent Labs  Lab 10/18/19 2318 10/20/19 0450 10/20/19 1028 10/21/19 0343  WBC 15.3* 12.5*  --  12.4*  NEUTROABS 13.3*  --   --   --   HGB 11.0* 7.2* 7.2* 7.4*  HCT 34.3* 22.4* 23.2* 24.2*  MCV 96.9 100.0  --  100.4*  PLT 349 236  --  660   Basic Metabolic Panel: Recent Labs  Lab 10/18/19 2355 10/20/19 0450 10/21/19 0343  NA 147* 144 143  K 4.1 3.3* 4.1  CL 103 104 106  CO2 29 29 25   GLUCOSE 156* 61* 113*  BUN 35* 33* 27*  CREATININE 1.61* 1.59* 1.73*  CALCIUM 8.6* 7.8* 7.9*  MG 2.0 2.2 2.2   GFR: Estimated Creatinine Clearance: 28.7 mL/min (A) (by C-G formula based on SCr of 1.73 mg/dL (H)). Liver Function Tests:  Recent Labs  Lab 10/18/19 2355 10/20/19 0450  AST 55* 40  ALT 43 32  ALKPHOS 76 47  BILITOT 2.5* 1.7*  PROT 7.2 5.3*  ALBUMIN 3.5 2.6*   Recent Labs  Lab 10/18/19 2355  LIPASE 14   No results for input(s): AMMONIA in the last 168 hours. Coagulation Profile: Recent Labs  Lab 10/18/19 2319 10/20/19 0450  INR 10.2* 1.8*   Cardiac Enzymes: No results for input(s): CKTOTAL, CKMB, CKMBINDEX, TROPONINI in the last 168 hours. BNP (last 3 results) No results for input(s): PROBNP in the last 8760 hours. HbA1C: Recent Labs    10/20/19 0450 10/20/19 1324  HGBA1C 5.8* 5.8*   CBG: Recent Labs  Lab 10/20/19 2140 10/20/19 2248 10/21/19 0741 10/21/19 1111 10/21/19 1646  GLUCAP 67* 129* 80 132* 105*   Lipid Profile: No results for input(s): CHOL, HDL, LDLCALC, TRIG, CHOLHDL, LDLDIRECT in the last 72 hours. Thyroid Function Tests: No results for input(s): TSH, T4TOTAL, FREET4, T3FREE, THYROIDAB in the last 72 hours. Anemia Panel: Recent Labs    10/20/19 1028  VITAMINB12 487  FOLATE 14.3  FERRITIN 63  TIBC 246*  IRON 27*  RETICCTPCT 3.2*   Sepsis Labs: Recent Labs  Lab 10/19/19 0519 10/19/19 1049 10/19/19 1529 10/20/19 0450  LATICACIDVEN 1.2 1.4 1.5 1.2    Recent Results (from the past 240 hour(s))  Respiratory Panel by RT PCR (Flu A&B, Covid) - Nasopharyngeal Swab     Status: None   Collection Time: 10/18/19 11:18 PM   Specimen: Nasopharyngeal Swab  Result Value Ref Range Status   SARS Coronavirus 2 by RT PCR NEGATIVE NEGATIVE Final    Comment: (NOTE) SARS-CoV-2 target nucleic acids are NOT DETECTED. The SARS-CoV-2 RNA is generally detectable in upper respiratoy specimens during the acute phase of infection. The lowest concentration of SARS-CoV-2 viral copies this assay can detect is 131 copies/mL. A negative result does not preclude SARS-Cov-2 infection and should not be used as the sole basis for treatment or other patient management decisions. A  negative result may occur with  improper specimen collection/handling, submission of specimen other than nasopharyngeal swab, presence of viral mutation(s) within the areas targeted by this assay, and inadequate number of viral copies (<131 copies/mL). A negative result must be combined with clinical observations, patient history, and epidemiological information. The expected result is Negative. Fact Sheet for Patients:  PinkCheek.be Fact Sheet for Healthcare Providers:  GravelBags.it This test is not yet ap proved or cleared by the [place] FDA and  has been authorized for detection and/or diagnosis of SARS-CoV-2 by FDA under an  Emergency Use Authorization (EUA). This EUA will remain  in effect (meaning this test can be used) for the duration of the COVID-19 declaration under Section 564(b)(1) of the Act, 21 U.S.C. section 360bbb-3(b)(1), unless the authorization is terminated or revoked sooner.    Influenza A by PCR NEGATIVE NEGATIVE Final   Influenza B by PCR NEGATIVE NEGATIVE Final    Comment: (NOTE) The Xpert Xpress SARS-CoV-2/FLU/RSV assay is intended as an aid in  the diagnosis of influenza from Nasopharyngeal swab specimens and  should not be used as a sole basis for treatment. Nasal washings and  aspirates are unacceptable for Xpert Xpress SARS-CoV-2/FLU/RSV  testing. Fact Sheet for Patients: PinkCheek.be Fact Sheet for Healthcare Providers: GravelBags.it This test is not yet approved or cleared by the [place] FDA and  has been authorized for detection and/or diagnosis of SARS-CoV-2 by  FDA under an Emergency Use Authorization (EUA). This EUA will remain  in effect (meaning this test can be used) for the duration of the  Covid-19 declaration under Section 564(b)(1) of the Act, 21  U.S.C. section 360bbb-3(b)(1), unless the authorization is   terminated or revoked. Performed at [facility], [address], [place], [place] [address]   Blood culture (routine x 2)     Status: None (Preliminary result)   Collection Time: 10/19/19  3:12 AM   Specimen: BLOOD LEFT ARM  Result Value Ref Range Status   Specimen Description BLOOD LEFT ARM  Final   Special Requests   Final    BOTTLES DRAWN AEROBIC ONLY Blood Culture adequate volume   Culture   Final    NO GROWTH 2 DAYS Performed at [facility], [address] [place], [place] [address]    Report Status PENDING  Incomplete  Blood culture (routine x 2)     Status: None (Preliminary result)   Collection Time: 10/19/19  5:19 AM   Specimen: Left Antecubital; Blood  Result Value Ref Range Status   Specimen Description LEFT ANTECUBITAL  Final   Special Requests   Final    BOTTLES DRAWN AEROBIC AND ANAEROBIC Blood Culture adequate volume   Culture   Final    NO GROWTH 2 DAYS Performed at [facility], [address] [place], [place] [address]    Report Status PENDING  Incomplete  MRSA PCR Screening     Status: None   Collection Time: 10/19/19  6:17 AM   Specimen: Nasopharyngeal  Result Value Ref Range Status   MRSA by PCR NEGATIVE NEGATIVE Final    Comment:        The GeneXpert MRSA Assay (FDA approved for NASAL specimens only), is one component of a comprehensive MRSA colonization surveillance program. It is not intended to diagnose MRSA infection nor to guide or monitor treatment for MRSA infections. Performed at [facility], [address], [place], [place] [address]          Radiology Studies: No results found.      Scheduled Meds: . atorvastatin  10 mg Oral Daily  . Chlorhexidine Gluconate Cloth  6 each Topical Daily  . diltiazem  30 mg Oral Q6H  . feeding supplement  1 Container Oral TID WC & HS  . insulin aspart  0-6 Units Subcutaneous TID WC  . mouth rinse  15 mL Mouth Rinse BID  . methocarbamol  500 mg Oral QID  . metoprolol tartrate  50 mg Oral  BID  . pantoprazole (PROTONIX) IV  40 mg Intravenous Q12H  . sodium bicarbonate  650 mg Oral QID  . sodium chloride flush  3 mL Intravenous Q12H   Continuous Infusions: . sodium chloride    . azithromycin Stopped (10/21/19 0226)  . lactated ringers 75 mL/hr at 10/21/19 1700  . piperacillin-tazobactam (ZOSYN)  IV 12.5 mL/hr at 10/21/19 1700     LOS: 2 days    Time spent: 30 minutes    Kathie Dike, MD [facility]  If 7PM-7AM, please contact night-coverage [email or web address]  10/21/2019, 7:44 PM

## 2019-10-21 NOTE — Progress Notes (Signed)
Subjective: Reports episode of melena last night. Confirmed with nurse tech. No further melena today. No other overt GI bleeding. Minimal nasea this morning. No vomiting. Had grits for breakfast and tolerated this well. Denies abdominal pain. Reports he still feels short of breath and very congested. On 4 L nasal canula. At home, typically only on 2 L nasal canula at night.  Objective: Vital signs in last 24 hours: Temp:  [97.9 F (36.6 C)-98.6 F (37 C)] 98.3 F (36.8 C) (01/05 0743) Pulse Rate:  [68-90] 89 (01/05 0900) Resp:  [16-24] 18 (01/05 0900) BP: (72-155)/(54-110) 127/79 (01/05 0900) SpO2:  [85 %-98 %] 97 % (01/05 0900) Weight:  [54.1 kg] 54.1 kg (01/05 0500) Last BM Date: 10/20/19 General:   Alert and oriented, pleasant Head:  Normocephalic and atraumatic. Eyes:  No icterus, sclera clear. Conjuctiva pink.  Abdomen:  Bowel sounds present, soft, non-tender, non-distended. No HSM or hernias noted. No rebound or guarding. No masses appreciated  Extremities:  Without clubbing or edema. Neurologic:  Alert and  oriented x4;  grossly normal neurologically. Psych: Normal mood and affect.  Intake/Output from previous day: 01/04 0701 - 01/05 0700 In: 4688.2 [I.V.:2914.9; IV Piggyback:1773.3] Out: 950 [Urine:950] Intake/Output this shift: Total I/O In: 433.8 [I.V.:394.1; IV Piggyback:39.6] Out: -   Lab Results: Recent Labs    10/18/19 2318 10/20/19 0450 10/20/19 1028 10/21/19 0343  WBC 15.3* 12.5*  --  12.4*  HGB 11.0* 7.2* 7.2* 7.4*  HCT 34.3* 22.4* 23.2* 24.2*  PLT 349 236  --  246   BMET Recent Labs    10/18/19 2355 10/20/19 0450 10/21/19 0343  NA 147* 144 143  K 4.1 3.3* 4.1  CL 103 104 106  CO2 29 29 25   GLUCOSE 156* 61* 113*  BUN 35* 33* 27*  CREATININE 1.61* 1.59* 1.73*  CALCIUM 8.6* 7.8* 7.9*   LFT Recent Labs    10/18/19 2355 10/20/19 0450  PROT 7.2 5.3*  ALBUMIN 3.5 2.6*  AST 55* 40  ALT 43 32  ALKPHOS 76 47  BILITOT 2.5* 1.7*    PT/INR Recent Labs    10/18/19 2319 10/20/19 0450  LABPROT 81.3* 21.0*  INR 10.2* 1.8*    Studies/Results: ECHOCARDIOGRAM COMPLETE  Result Date: 10/19/2019   ECHOCARDIOGRAM REPORT   Patient Name:   Blake Wise Date of Exam: 10/19/2019 Medical Rec #:  [id number]    Height:       71.0 in Accession #:    [id number]   Weight:       124.1 lb Date of Birth:  [birth year]   BSA:          1.72 m Patient Age:    74 years     BP:           152/84 mmHg Patient Gender: M            HR:           112 bpm. Exam Location:  [facility] Procedure: 2D Echo Indications:    atrial fibrillation  History:        Patient has no prior history of Echocardiogram examinations.                 CHF, COPD, Arrythmias:Atrial Fibrillation; Risk                 Factors:Diabetes, Hypertension and Former Smoker.  Sonographer:    Jannett Celestine RDCS (AE) Referring Phys: [place]  1. Left ventricular  ejection fraction, by visual estimation, is 50 to 55%. The left ventricle has low normal function. There is no left ventricular hypertrophy.  2. Left ventricular diastolic parameters are indeterminate.  3. Right ventricular volume and pressure overload.  4. The left ventricle has no regional wall motion abnormalities.  5. Global right ventricle has mildly to moderately reduced systolic function.The right ventricular size is severely enlarged. No increase in right ventricular wall thickness.  6. Left atrial size was normal.  7. Right atrial size was severely dilated.  8. The mitral valve is grossly normal. Moderate mitral valve regurgitation.  9. The tricuspid valve is grossly normal. 10. The aortic valve is tricuspid. Aortic valve regurgitation is not visualized. Mild aortic valve sclerosis without stenosis. 11. The pulmonic valve was not well visualized. Pulmonic valve regurgitation is trivial. 12. Severely elevated pulmonary artery systolic pressure. 13. The tricuspid regurgitant velocity is 4.03 m/s, and with an  assumed right atrial pressure of 3 mmHg, the estimated right ventricular systolic pressure is severely elevated at 68.0 mmHg. 14. The inferior vena cava is normal in size with greater than 50% respiratory variability, suggesting right atrial pressure of 3 mmHg. FINDINGS  Left Ventricle: Left ventricular ejection fraction, by visual estimation, is 50 to 55%. The left ventricle has low normal function. The left ventricle has no regional wall motion abnormalities. The left ventricular internal cavity size was the left ventricle is normal in size. There is no left ventricular hypertrophy. The interventricular septum is flattened in systole and diastole, consistent with right ventricular pressure and volume overload. Left ventricular diastolic parameters are indeterminate. Right Ventricle: The right ventricular size is severely enlarged. No increase in right ventricular wall thickness. Global RV systolic function is has mildly reduced systolic function. The tricuspid regurgitant velocity is 4.03 m/s, and with an assumed right atrial pressure of 3 mmHg, the estimated right ventricular systolic pressure is severely elevated at 68.0 mmHg. Left Atrium: Left atrial size was normal in size. Right Atrium: Right atrial size was severely dilated Pericardium: There is no evidence of pericardial effusion. Mitral Valve: The mitral valve is grossly normal. Moderate mitral valve regurgitation. Tricuspid Valve: The tricuspid valve is grossly normal. Tricuspid valve regurgitation moderate-severe. Aortic Valve: The aortic valve is tricuspid. Aortic valve regurgitation is not visualized. Mild aortic valve sclerosis is present, with no evidence of aortic valve stenosis. Pulmonic Valve: The pulmonic valve was not well visualized. Pulmonic valve regurgitation is trivial. Pulmonic regurgitation is trivial. Aorta: The aortic root is normal in size and structure. Venous: The inferior vena cava is normal in size with greater than 50% respiratory  variability, suggesting right atrial pressure of 3 mmHg. IAS/Shunts: No atrial level shunt detected by color flow Doppler.  LEFT VENTRICLE PLAX 2D LVIDd:         3.56 cm LVIDs:         2.66 cm LV PW:         0.98 cm LV IVS:        0.78 cm LVOT diam:     2.40 cm LV SV:         27 ml LV SV Index:   16.22 LVOT Area:     4.52 cm  RIGHT VENTRICLE RV S prime:     17.30 cm/s TAPSE (M-mode): 1.5 cm LEFT ATRIUM           Index       RIGHT ATRIUM           Index LA diam:  3.80 cm 2.21 cm/m  RA Area:     19.00 cm LA Vol (A4C): 32.8 ml 19.05 ml/m RA Volume:   50.00 ml  29.04 ml/m  AORTIC VALVE LVOT Vmax:   65.00 cm/s LVOT Vmean:  51.100 cm/s LVOT VTI:    0.143 m  AORTA Ao Root diam: 2.70 cm MITRAL VALVE                       TRICUSPID VALVE MV Area (PHT): 2.76 cm            TR Peak grad:   65.0 mmHg MV PHT:        79.75 msec          TR Vmax:        403.00 cm/s MV Decel Time: 275 msec MV E velocity: 60.60 cm/s 103 cm/s SHUNTS                                    Systemic VTI:  0.14 m                                    Systemic Diam: 2.40 cm  Rozann Lesches MD Electronically signed by Rozann Lesches MD Signature Date/Time: 10/19/2019/12:58:01 PM    Final     Assessment:  74 year old male admitted with sepsis in setting of pneumonia and enteritis, afib with RVR, supratherapeutic INR of 10.2 in setting of Coumadin, now improved s/p Vit K. GI consulted due to acute drop in Hgb from 11 range on admission to 7.2 and reports of black stool and coffee-ground emesis prior to admission.  Iron studies with ferritin normal at 63,  saturation ratio low at 11%, total iron low at 27, TIBC low at 246.  CT with marked wall thickening and mucosal enhancement involving duodenum and proximal jejunum, with associated mesenteric edema and free fluid. Hemoccult has been requested but not completed.  Received IV Feraheme on 10/19/2018.  Last colonoscopy/EGD reportedly normal approximately 5 years ago and eating by Dr. Juliann Pulse per patient.    Per Dr. Oneida Alar, as patient was without current melena or BRBPR and is being treated for pneumonia, EGD in 2-3 weeks as outpatient once he has been adequately treated for his pneumonia. Hemoglobin is stable today at 7.4. He reports one episode of melena last night. No further overt GI bleeding today. Continues with little improvement in respiratory status. Would recommend we continue to hold off on procedures at this time due to poor respiratory status. If he develops any further overt GI bleeding or transfusion dependent anemia, consider inpatient EGD.   Plan: Continue IV Protonix 40 mg BID Continue monitoring for overt GI bleeding.  Follow H/H EGD outpatient after pneumonia adequately treated (pssibly in 2-3 weeks). Consider inpatient if overt/active GI bleeding or transfusion dependent anemia.  Continue to hold coumadin.  Continue supportive measures.     LOS: 2 days    10/21/2019, 9:45 AM   Aliene Altes, [facility]

## 2019-10-22 ENCOUNTER — Inpatient Hospital Stay (HOSPITAL_COMMUNITY): Payer: Medicare HMO

## 2019-10-22 ENCOUNTER — Telehealth: Payer: Self-pay | Admitting: Gastroenterology

## 2019-10-22 DIAGNOSIS — I4891 Unspecified atrial fibrillation: Secondary | ICD-10-CM

## 2019-10-22 LAB — CBC
HCT: 25 % — ABNORMAL LOW (ref 39.0–52.0)
Hemoglobin: 7.7 g/dL — ABNORMAL LOW (ref 13.0–17.0)
MCH: 30.9 pg (ref 26.0–34.0)
MCHC: 30.8 g/dL (ref 30.0–36.0)
MCV: 100.4 fL — ABNORMAL HIGH (ref 80.0–100.0)
Platelets: 278 10*3/uL (ref 150–400)
RBC: 2.49 MIL/uL — ABNORMAL LOW (ref 4.22–5.81)
RDW: 17.7 % — ABNORMAL HIGH (ref 11.5–15.5)
WBC: 12.2 10*3/uL — ABNORMAL HIGH (ref 4.0–10.5)
nRBC: 1.6 % — ABNORMAL HIGH (ref 0.0–0.2)

## 2019-10-22 LAB — GLUCOSE, CAPILLARY
Glucose-Capillary: 88 mg/dL (ref 70–99)
Glucose-Capillary: 127 mg/dL — ABNORMAL HIGH (ref 70–99)
Glucose-Capillary: 192 mg/dL — ABNORMAL HIGH (ref 70–99)
Glucose-Capillary: 193 mg/dL — ABNORMAL HIGH (ref 70–99)

## 2019-10-22 LAB — BASIC METABOLIC PANEL
Anion gap: 12 (ref 5–15)
BUN: 22 mg/dL (ref 8–23)
CO2: 25 mmol/L (ref 22–32)
Calcium: 8.3 mg/dL — ABNORMAL LOW (ref 8.9–10.3)
Chloride: 106 mmol/L (ref 98–111)
Creatinine, Ser: 1.57 mg/dL — ABNORMAL HIGH (ref 0.61–1.24)
GFR calc Af Amer: 50 mL/min — ABNORMAL LOW (ref >60)
GFR calc non Af Amer: 43 mL/min — ABNORMAL LOW (ref >60)
Glucose, Bld: 120 mg/dL — ABNORMAL HIGH (ref 70–99)
Potassium: 3.7 mmol/L (ref 3.5–5.1)
Sodium: 143 mmol/L (ref 135–145)

## 2019-10-22 LAB — PROTIME-INR
INR: 2.5 — ABNORMAL HIGH (ref 0.8–1.2)
Prothrombin Time: 27.3 seconds — ABNORMAL HIGH (ref 11.4–15.2)

## 2019-10-22 MED ORDER — HYDROCODONE-HOMATROPINE 5-1.5 MG/5ML PO SYRP
5.0000 mL | ORAL_SOLUTION | ORAL | Status: DC | PRN
  Administered 2019-10-23 – 2019-10-28 (×7): 5 mL via ORAL
  Filled 2019-10-22 (×7): qty 5

## 2019-10-22 MED ORDER — METHYLPREDNISOLONE SODIUM SUCC 125 MG IJ SOLR
60.0000 mg | Freq: Two times a day (BID) | INTRAMUSCULAR | Status: DC
  Administered 2019-10-22 – 2019-10-28 (×13): 60 mg via INTRAVENOUS
  Filled 2019-10-22 (×13): qty 2

## 2019-10-22 MED ORDER — PANTOPRAZOLE SODIUM 40 MG PO TBEC
40.0000 mg | DELAYED_RELEASE_TABLET | Freq: Two times a day (BID) | ORAL | Status: DC
  Administered 2019-10-22 – 2019-10-28 (×13): 40 mg via ORAL
  Filled 2019-10-22 (×12): qty 1

## 2019-10-22 MED ORDER — WARFARIN SODIUM 1 MG PO TABS: 1.0000 mg | ORAL_TABLET | Freq: Once | ORAL | Status: DC

## 2019-10-22 MED ORDER — DILTIAZEM HCL 30 MG PO TABS
30.0000 mg | ORAL_TABLET | Freq: Two times a day (BID) | ORAL | Status: DC
  Administered 2019-10-23 – 2019-10-28 (×11): 30 mg via ORAL
  Filled 2019-10-22 (×13): qty 1

## 2019-10-22 NOTE — Progress Notes (Signed)
    Subjective: No N/V. No abdominal pain. Tolerating diet. Brown stool overnight. Feels like breathing is improved from admission. Remains on nasal cannula.    Objective: Vital signs in last 24 hours: Temp:  [98 F (36.7 C)-99.2 F (37.3 C)] 99 F (37.2 C) (01/06 0724) Pulse Rate:  [71-97] 83 (01/06 0730) Resp:  [16-26] 18 (01/06 0730) BP: (94-144)/(66-103) 127/84 (01/06 0730) SpO2:  [90 %-99 %] 94 % (01/06 0732) Weight:  [54.6 kg] 54.6 kg (01/06 0500) Last BM Date: 10/21/19 General:   Alert and oriented, pleasant Head:  Normocephalic and atraumatic. Abdomen:  Bowel sounds present, soft, non-tender, non-distended.  Neurologic:  Alert and  oriented x4 Psych:   Pleasant affect  Intake/Output from previous day: 01/05 0701 - 01/06 0700 In: 2973 [P.O.:480; I.V.:2086.4; IV Piggyback:406.7] Out: 1250 [Urine:1250] Intake/Output this shift: Total I/O In: 43.8 [I.V.:37.6; IV Piggyback:6.2] Out: -   Lab Results: Recent Labs    10/20/19 0450 10/20/19 1028 10/21/19 0343 10/22/19 0448  WBC 12.5*  --  12.4* 12.2*  HGB 7.2* 7.2* 7.4* 7.7*  HCT 22.4* 23.2* 24.2* 25.0*  PLT 236  --  246 278   BMET Recent Labs    10/20/19 0450 10/21/19 0343 10/22/19 0448  NA 144 143 143  K 3.3* 4.1 3.7  CL 104 106 106  CO2 29 25 25   GLUCOSE 61* 113* 120*  BUN 33* 27* 22  CREATININE 1.59* 1.73* 1.57*  CALCIUM 7.8* 7.9* 8.3*   LFT Recent Labs    10/20/19 0450  PROT 5.3*  ALBUMIN 2.6*  AST 40  ALT 32  ALKPHOS 47  BILITOT 1.7*   PT/INR Recent Labs    10/20/19 0450 10/22/19 0448  LABPROT 21.0* 27.3*  INR 1.8* 2.5*    Assessment: Very pleasant 74 year old male admitted with sepsis in setting of pneumonia and enteritis, afib with RVR, supratherapeutic INR of 10.2 in setting of Coumadin, now improved s/p Vit K, concerns for melena and coffee-ground emesis prior to admission, with acute drop in Hgb from 11 range on admission to 7.2. Hgb continues to improve and no need for  transfusion this admission. CT with marked wall thickening and mucosal enhancement involving duodenum and proximal jejunum, with associated mesenteric edema and free fluid.   No overt GI bleeding, noting brown stool yesterday evening. Continues to clinically improve. No GI concerns today. EGD on hold while inpatient unless transfusion dependent anemia or active GI bleeding. Coumadin has been resumed with pharmacy following. Will plan on EGD as outpatient in 2-3 weeks once recovered from a respiratory standpoint. Discussed this with patient.   Plan: Continue PPI BID Advance to soft diet Coumadin resumed per pharmacy dosing Would pursue EGD only if active GI bleeding and transfusion dependent anemia: will sign off and arrange outpatient follow-up for EGD   Annitta Needs, PhD, ANP-BC [facility]    LOS: 3 days    10/22/2019, 8:12 AM

## 2019-10-22 NOTE — Telephone Encounter (Signed)
CALLED ICU AND SCHEDULED PATIENT

## 2019-10-22 NOTE — Plan of Care (Signed)
  Problem: Acute Rehab PT Goals(only PT should resolve) Goal: Pt Will Go Supine/Side To Sit Outcome: Progressing Flowsheets (Taken 10/22/2019 1609) Pt will go Supine/Side to Sit: Independently Goal: Patient Will Transfer Sit To/From Stand Outcome: Progressing Flowsheets (Taken 10/22/2019 1609) Patient will transfer sit to/from stand: with modified independence Goal: Pt Will Transfer Bed To Chair/Chair To Bed Outcome: Progressing Flowsheets (Taken 10/22/2019 1609) Pt will Transfer Bed to Chair/Chair to Bed: with modified independence Goal: Pt Will Ambulate Outcome: Progressing Flowsheets (Taken 10/22/2019 1609) Pt will Ambulate:  > 125 feet  with modified independence   4:09 PM, 10/22/19 Lonell Grandchild, MPT Physical Therapist with [facility] 336 [phone] office [phone] mobile phone

## 2019-10-22 NOTE — Progress Notes (Signed)
PROGRESS NOTE    Blake Wise  EXN:[id number] DOB: 1946-04-18 DOA: 10/18/2019 PCP: Patient, No Pcp Per   Brief Narrative:  Per HPI: Blake Wise a 74 y.o.malewith medical history significant ofatrial fibrillation, congestive heart failure, COPD, hypertension, hyperlipidemia, diabetes mellitus type 2 who presented to the ER with worsening shortness of breath, abdominal pain. Patient is states he has been having left upper quadrant abdominal pain for the last 1 week. He is also had multiple episodes of vomiting. Patient reports progressively worsening shortness of breath, described as dyspnea on exertion.He is also had a cough with bloody sputum over the last 2 days. Today he had palpitations which persisted through the day. No chest pain reported. No fever, chills. No hematemesis, melena, hematochezia, dysuria, diarrhea, syncope, seizures, dizziness, lightheadedness, focal deficits, blurring of vision.  Assessment & Plan:   Principal Problem:   Atrial fibrillation with RVR (HCC) Active Problems:   Chronic diastolic CHF (congestive heart failure) (HCC)   Chest pain   DM2 (diabetes mellitus, type 2) (HCC)   HTN (hypertension)   Acute urinary retention   Elevated INR   Community acquired pneumonia   Enteritis   Acute blood loss anemia   Sepsis-multifactorial with enteritis as well as right-sided pneumonia with concern for aspiration-improving -Continue on Zosyn and azithromycin as ordered -Continue on gentle IV fluid for now -Blood culture with no growth x3 days -No further diarrhea, vomiting, abdominal pain -Shortness of breath is slowly improving.  Atrial fibrillation with RVR secondary to above-resolved -IV Cardizem drip discontinued and patient transition to oral Cardizem 30 mg every 12 hours  -Continue on home dose of metoprolol -2D echo with LVEF 50-55% -Patient had supratherapeutic INR which has now resolved -Coumadin currently on hold in light of hemoptysis   Acute anemia-suspect blood loss -Patient with 2 dark bowel movements prior to admission and had a significant drop in hemoglobin levels with soft blood pressure readings -Unclear if this is truly related to blood loss versus hemodilution -Seen by GI with plans for outpatient endoscopy unless he has gross bleeding or transfusion dependent anemia -Hemoglobin is currently stable -Received a dose of IV iron -Coumadin currently on hold in the setting of anemia/bleeding  Hemoptysis -Patient noted to have frank blood with hemoptysis today.   -This has progressively gotten worse over the past 1 to 2 days -We will hold further anticoagulation -Check CT chest  -May need pulmonology input.  Mild acute on chronic hypoxemic respiratory failure -Currently on 3 L and baseline 2 L at home -Wean as tolerated  Supratherapeutic INR-resolved -Appreciate pharmacy management -Given vitamin K on 1/3  Troponin elevation likely secondary to demand ischemia -No chest pain or findings of ACS on EKG  Chronic diastolic congestive heart failure -No volume overload currently noted -Follow daily weights -Repeat 2D echocardiogram with noted LVEF 50-55% -Lasix on hold due to sepsis physiology, will need to be reinitiated once this resolves  Hypertension-stable -Continue metoprolol as well as Cardizem and monitor carefully  Type 2 diabetes -A1c 5.8 -Hold Januvia for now -Blood sugars stable -Anticipate blood sugar should increase since he is not receiving steroids.  Chronic COPD on home 2 L nasal cannula oxygen -Patient is still short of breath has rhonchi and wheezing -Continue bronchodilators -Add intravenous steroids   DVT prophylaxis: Coumadin with INR 2.5 Code Status: Full Family Communication: None at bedside Disposition Plan:  Continue careful monitoring and transfuse as needed.  Continue current antibiotics.  Monitor heart rate and blood pressure on current medications.  Further  evaluation for hemoptysis.   Consultants:   GI  Procedures:   None  Antimicrobials:  Anti-infectives (From admission, onward)   Start     Dose/Rate Route Frequency Ordered Stop   10/20/19 0200  azithromycin (ZITHROMAX) 500 mg in sodium chloride 0.9 % 250 mL IVPB     500 mg 250 mL/hr over 60 Minutes Intravenous Every 24 hours 10/19/19 0622     10/19/19 1400  piperacillin-tazobactam (ZOSYN) IVPB 3.375 g     3.375 g 12.5 mL/hr over 240 Minutes Intravenous Every 8 hours 10/19/19 0635     10/19/19 0700  piperacillin-tazobactam (ZOSYN) IVPB 3.375 g     3.375 g 100 mL/hr over 30 Minutes Intravenous  Once 10/19/19 0635 10/19/19 0810   10/19/19 0315  metroNIDAZOLE (FLAGYL) IVPB 500 mg     500 mg 100 mL/hr over 60 Minutes Intravenous  Once 10/19/19 0314 10/19/19 0431   10/19/19 0100  cefTRIAXone (ROCEPHIN) 1 g in sodium chloride 0.9 % 100 mL IVPB     1 g 200 mL/hr over 30 Minutes Intravenous  Once 10/19/19 0053 10/19/19 0215   10/19/19 0100  azithromycin (ZITHROMAX) 500 mg in sodium chloride 0.9 % 250 mL IVPB     500 mg 250 mL/hr over 60 Minutes Intravenous  Once 10/19/19 0053 10/19/19 0227      Subjective: Patient reports continued cough.  He is noted to have hemoptysis with frank blood today.  Still feels short of breath.  Objective: Vitals:   10/22/19 1513 10/22/19 1530 10/22/19 1643 10/22/19 1700  BP:  121/83  (!) 146/90  Pulse:  93 80 86  Resp:  (!) 21 15 (!) 27  Temp:   98 F (36.7 C)   TempSrc:   Oral   SpO2: 96% 93% 94% 96%  Weight:      Height:        Intake/Output Summary (Last 24 hours) at 10/22/2019 1838 Last data filed at 10/22/2019 1748 Gross per 24 hour  Intake 2095.21 ml  Output 1300 ml  Net 795.21 ml   Filed Weights   10/20/19 0500 10/21/19 0500 10/22/19 0500  Weight: 54.7 kg 54.1 kg 54.6 kg    Examination:  General exam: Alert, awake, oriented x 3 Respiratory system: Bilateral rhonchi and wheezing.  Mild increase in respiratory effort.  Cardiovascular system:RRR. No murmurs, rubs, gallops. Gastrointestinal system: Abdomen is nondistended, soft and nontender. No organomegaly or masses felt. Normal bowel sounds heard. Central nervous system: Alert and oriented. No focal neurological deficits. Extremities: No C/C/E, +pedal pulses Skin: No rashes, lesions or ulcers Psychiatry: Judgement and insight appear normal. Mood & affect appropriate.       Data Reviewed: I have personally reviewed following labs and imaging studies  CBC: Recent Labs  Lab 10/18/19 2318 10/20/19 0450 10/20/19 1028 10/21/19 0343 10/22/19 0448  WBC 15.3* 12.5*  --  12.4* 12.2*  NEUTROABS 13.3*  --   --   --   --   HGB 11.0* 7.2* 7.2* 7.4* 7.7*  HCT 34.3* 22.4* 23.2* 24.2* 25.0*  MCV 96.9 100.0  --  100.4* 100.4*  PLT 349 236  --  246 967   Basic Metabolic Panel: Recent Labs  Lab 10/18/19 2355 10/20/19 0450 10/21/19 0343 10/22/19 0448  NA 147* 144 143 143  K 4.1 3.3* 4.1 3.7  CL 103 104 106 106  CO2 29 29 25 25   GLUCOSE 156* 61* 113* 120*  BUN 35* 33* 27* 22  CREATININE 1.61* 1.59* 1.73* 1.57*  CALCIUM 8.6* 7.8* 7.9* 8.3*  MG 2.0 2.2 2.2  --    GFR: Estimated Creatinine Clearance: 31.9 mL/min (A) (by C-G formula based on SCr of 1.57 mg/dL (H)). Liver Function Tests: Recent Labs  Lab 10/18/19 2355 10/20/19 0450  AST 55* 40  ALT 43 32  ALKPHOS 76 47  BILITOT 2.5* 1.7*  PROT 7.2 5.3*  ALBUMIN 3.5 2.6*   Recent Labs  Lab 10/18/19 2355  LIPASE 14   No results for input(s): AMMONIA in the last 168 hours. Coagulation Profile: Recent Labs  Lab 10/18/19 2319 10/20/19 0450 10/22/19 0448  INR 10.2* 1.8* 2.5*   Cardiac Enzymes: No results for input(s): CKTOTAL, CKMB, CKMBINDEX, TROPONINI in the last 168 hours. BNP (last 3 results) No results for input(s): PROBNP in the last 8760 hours. HbA1C: Recent Labs    10/20/19 0450 10/20/19 1324  HGBA1C 5.8* 5.8*   CBG: Recent Labs  Lab 10/21/19 1646 10/21/19 2159  10/22/19 0723 10/22/19 1127 10/22/19 1641  GLUCAP 105* 129* 88 127* 192*   Lipid Profile: No results for input(s): CHOL, HDL, LDLCALC, TRIG, CHOLHDL, LDLDIRECT in the last 72 hours. Thyroid Function Tests: No results for input(s): TSH, T4TOTAL, FREET4, T3FREE, THYROIDAB in the last 72 hours. Anemia Panel: Recent Labs    10/20/19 1028  VITAMINB12 487  FOLATE 14.3  FERRITIN 63  TIBC 246*  IRON 27*  RETICCTPCT 3.2*   Sepsis Labs: Recent Labs  Lab 10/19/19 0519 10/19/19 1049 10/19/19 1529 10/20/19 0450  LATICACIDVEN 1.2 1.4 1.5 1.2    Recent Results (from the past 240 hour(s))  Respiratory Panel by RT PCR (Flu A&B, Covid) - Nasopharyngeal Swab     Status: None   Collection Time: 10/18/19 11:18 PM   Specimen: Nasopharyngeal Swab  Result Value Ref Range Status   SARS Coronavirus 2 by RT PCR NEGATIVE NEGATIVE Final    Comment: (NOTE) SARS-CoV-2 target nucleic acids are NOT DETECTED. The SARS-CoV-2 RNA is generally detectable in upper respiratoy specimens during the acute phase of infection. The lowest concentration of SARS-CoV-2 viral copies this assay can detect is 131 copies/mL. A negative result does not preclude SARS-Cov-2 infection and should not be used as the sole basis for treatment or other patient management decisions. A negative result may occur with  improper specimen collection/handling, submission of specimen other than nasopharyngeal swab, presence of viral mutation(s) within the areas targeted by this assay, and inadequate number of viral copies (<131 copies/mL). A negative result must be combined with clinical observations, patient history, and epidemiological information. The expected result is Negative. Fact Sheet for Patients:  PinkCheek.be Fact Sheet for Healthcare Providers:  GravelBags.it This test is not yet ap proved or cleared by the [place] FDA and  has been authorized for  detection and/or diagnosis of SARS-CoV-2 by FDA under an Emergency Use Authorization (EUA). This EUA will remain  in effect (meaning this test can be used) for the duration of the COVID-19 declaration under Section 564(b)(1) of the Act, 21 U.S.C. section 360bbb-3(b)(1), unless the authorization is terminated or revoked sooner.    Influenza A by PCR NEGATIVE NEGATIVE Final   Influenza B by PCR NEGATIVE NEGATIVE Final    Comment: (NOTE) The Xpert Xpress SARS-CoV-2/FLU/RSV assay is intended as an aid in  the diagnosis of influenza from Nasopharyngeal swab specimens and  should not be used as a sole basis for treatment. Nasal washings and  aspirates are unacceptable for Xpert Xpress SARS-CoV-2/FLU/RSV  testing. Fact Sheet for Patients: PinkCheek.be Fact  Sheet for Healthcare Providers: GravelBags.it This test is not yet approved or cleared by the [place] and  has been authorized for detection and/or diagnosis of SARS-CoV-2 by  FDA under an Emergency Use Authorization (EUA). This EUA will remain  in effect (meaning this test can be used) for the duration of the  Covid-19 declaration under Section 564(b)(1) of the Act, 21  U.S.C. section 360bbb-3(b)(1), unless the authorization is  terminated or revoked. Performed at [facility], [address] [place], [place] [address]   Blood culture (routine x 2)     Status: None (Preliminary result)   Collection Time: 10/19/19  3:12 AM   Specimen: BLOOD LEFT ARM  Result Value Ref Range Status   Specimen Description BLOOD LEFT ARM  Final   Special Requests   Final    BOTTLES DRAWN AEROBIC ONLY Blood Culture adequate volume   Culture   Final    NO GROWTH 3 DAYS Performed at [facility], [address] [place], [place] [address]    Report Status PENDING  Incomplete  Blood culture (routine x 2)     Status: None (Preliminary result)   Collection Time: 10/19/19  5:19 AM   Specimen:  Left Antecubital; Blood  Result Value Ref Range Status   Specimen Description LEFT ANTECUBITAL  Final   Special Requests   Final    BOTTLES DRAWN AEROBIC AND ANAEROBIC Blood Culture adequate volume   Culture   Final    NO GROWTH 3 DAYS Performed at [facility], [address] [place], [place] [address]    Report Status PENDING  Incomplete  MRSA PCR Screening     Status: None   Collection Time: 10/19/19  6:17 AM   Specimen: Nasopharyngeal  Result Value Ref Range Status   MRSA by PCR NEGATIVE NEGATIVE Final    Comment:        The GeneXpert MRSA Assay (FDA approved for NASAL specimens only), is one component of a comprehensive MRSA colonization surveillance program. It is not intended to diagnose MRSA infection nor to guide or monitor treatment for MRSA infections. Performed at [facility], [address], [place], [place] [address]          Radiology Studies: CT CHEST WO CONTRAST  Result Date: 10/22/2019 CLINICAL DATA:  Cough with hemoptysis. Shortness of breath. Wheezing. EXAM: CT CHEST WITHOUT CONTRAST TECHNIQUE: Multidetector CT imaging of the chest was performed following the standard protocol without IV contrast. COMPARISON:  CT abdomen pelvis 10/19/2019. Images from CT chest 08/22/2013 are not available at the time of dictation. FINDINGS: Cardiovascular: Atherosclerotic calcification of aorta, aortic valve and coronary arteries. Heart is enlarged. No pericardial effusion. Mediastinum/Nodes: No pathologically enlarged mediastinal or axillary lymph nodes. Hilar regions are difficult to definitively evaluate without IV contrast. Esophagus is grossly unremarkable. Lungs/Pleura: Severe centrilobular emphysema. Image quality is degraded by respiratory motion. Rounded areas of airspace consolidation, probable fluid and ground-glass in the apical aspects of both upper lobes. Patchy ground-glass in the anterior segment right upper lobe and right middle lobe. New small bilateral pleural  effusions. Minimal associated compressive atelectasis in both lower lobes. Debris is seen in the mainstem bronchi, bronchus intermedius and right lower lobe bronchi. Airway is otherwise unremarkable. Upper Abdomen: Visualized portions the liver, adrenal glands, kidneys, spleen, pancreas, stomach and bowel are grossly unremarkable. Calcified upper abdominal lymph nodes. Trace perihepatic fluid. Musculoskeletal: No worrisome lytic or sclerotic lesions. IMPRESSION: 1. Areas of consolidation, probable fluid and ground-glass in the upper lobes and  right middle lobe, findings which may represent a combination pneumonia and pulmonary hemorrhage. As it is difficult to exclude a true pulmonary nodule in the upper lobes, follow-up CT chest without contrast in 3-4 weeks is recommended in further evaluation to exclude malignancy. 2. Small bilateral pleural effusions, new from 10/19/2019. 3. Aortic atherosclerosis (ICD10-I70.0). Coronary artery calcification. 4.  Emphysema (ICD10-J43.9). Electronically Signed   By: Lorin Picket M.D.   On: 10/22/2019 16:48        Scheduled Meds: . atorvastatin  10 mg Oral Daily  . Chlorhexidine Gluconate Cloth  6 each Topical Daily  . diltiazem  30 mg Oral Q12H  . feeding supplement  1 Container Oral TID WC & HS  . guaiFENesin  1,200 mg Oral BID  . insulin aspart  0-6 Units Subcutaneous TID WC  . ipratropium-albuterol  3 mL Nebulization Q6H  . mouth rinse  15 mL Mouth Rinse BID  . methocarbamol  500 mg Oral QID  . methylPREDNISolone (SOLU-MEDROL) injection  60 mg Intravenous Q12H  . metoprolol tartrate  50 mg Oral BID  . pantoprazole  40 mg Oral BID AC  . sodium bicarbonate  650 mg Oral QID  . sodium chloride flush  3 mL Intravenous Q12H  . Warfarin - Pharmacist Dosing Inpatient   Does not apply q1800   Continuous Infusions: . sodium chloride    . azithromycin Stopped (10/22/19 0415)  . piperacillin-tazobactam (ZOSYN)  IV 3.375 g (10/22/19 1339)     LOS: 3 days     Time spent: 10 minutes    Kathie Dike, MD [facility]  If 7PM-7AM, please contact night-coverage [email or web address]  10/22/2019, 6:38 PM

## 2019-10-22 NOTE — Progress Notes (Signed)
ANTICOAGULATION CONSULT NOTE -   Pharmacy Consult for warfarin dosing  Indication: atrial fibrillation   No Known Allergies    Patient Measurements: Last Weight  Most recent update: 10/22/2019  6:30 AM   Weight  54.6 kg (120 lb 5.9 oz)           Body mass index is 16.79 kg/m. [facility]               Temp: 99 F (37.2 C) (01/06 0724) Temp Source: Oral (01/06 0724) BP: 127/84 (01/06 0730) Pulse Rate: 83 (01/06 0730)  Labs: Recent Labs    10/20/19 0450 10/20/19 1028 10/21/19 0343 10/22/19 0448  HGB 7.2* 7.2* 7.4* 7.7*  HCT 22.4* 23.2* 24.2* 25.0*  PLT 236  --  246 278  LABPROT 21.0*  --   --  27.3*  INR 1.8*  --   --  2.5*  CREATININE 1.59*  --  1.73* 1.57*    Estimated Creatinine Clearance: 31.9 mL/min (A) (by C-G formula based on SCr of 1.57 mg/dL (H)).     Medications:  Medications Prior to Admission  Medication Sig Dispense Refill Last Dose  . albuterol (VENTOLIN HFA) 108 (90 Base) MCG/ACT inhaler Inhale into the lungs.   10/18/2019 at Unknown time  . atorvastatin (LIPITOR) 10 MG tablet Take 10 mg by mouth daily.   10/17/2019 at Unknown time  . Cholecalciferol 25 MCG (1000 UT) tablet Take by mouth.   10/17/2019  . diltiazem (TIAZAC) 240 MG 24 hr capsule Take 240 mg by mouth daily.   10/17/2019  . furosemide (LASIX) 20 MG tablet Take 20 mg by mouth daily.   10/17/2019 at Unknown time  . glimepiride (AMARYL) 1 MG tablet Take 1 mg by mouth daily.   10/17/2019  . hydrALAZINE (APRESOLINE) 10 MG tablet Take 10 mg by mouth 3 (three) times daily.   10/17/2019 at Unknown time  . ipratropium-albuterol (DUONEB) 0.5-2.5 (3) MG/3ML SOLN Inhale 3 mLs into the lungs every 6 (six) hours as needed (breathing).    10/17/2019  . isosorbide mononitrate (IMDUR) 30 MG 24 hr tablet Take 30 mg by mouth daily.   10/17/2019 at Unknown time  . lisinopril (ZESTRIL) 10 MG tablet Take 10 mg by mouth daily.    10/17/2019  . metoprolol tartrate (LOPRESSOR) 50 MG tablet Take 50 mg by mouth 2 (two) times daily.    10/17/2019 at Unknown time  . mirtazapine (REMERON) 15 MG tablet Take 15 mg by mouth at bedtime.    10/17/2019  . Multiple Vitamins-Minerals (MENS ONE DAILY PO) Take 1 tablet by mouth daily.   10/17/2019  . Omega-3 Fatty Acids (FISH OIL) 1000 MG CAPS Take 1 capsule by mouth daily.    10/17/2019  . sitaGLIPtin (JANUVIA) 50 MG tablet Take 50 mg by mouth daily.   10/17/2019 at Unknown time  . sodium bicarbonate 650 MG tablet Take 650 mg by mouth 4 (four) times daily.   10/17/2019 at Unknown time  . SPIRIVA HANDIHALER 18 MCG inhalation capsule 1 capsule daily.   10/17/2019  . SYMBICORT 160-4.5 MCG/ACT inhaler Inhale 1 puff into the lungs daily.    10/17/2019  . warfarin (COUMADIN) 2 MG tablet Take 2 mg by mouth See admin instructions. Take 2 mg tablet every Wed, Thurs, Friday, and Saturday along with 4 mg for a total of 6 mg   10/17/2019  . warfarin (COUMADIN) 4 MG tablet Take 4 mg by mouth See admin instructions. Take 1 tablet daily.  Take along  with 2 mg tablet on Wed, Thurs, Friday, and Saturday for a total of 6 mg.   10/17/2019 at Unknown time  . COLCRYS 0.6 MG tablet Take 0.6 mg by mouth 2 (two) times daily.   unknown  . diltiazem (TIAZAC) 360 MG 24 hr capsule Take 360 mg by mouth daily.     . methocarbamol (ROBAXIN) 500 MG tablet Take 500 mg by mouth 4 (four) times daily.   Not Taking at Unknown time   Scheduled:  . atorvastatin  10 mg Oral Daily  . Chlorhexidine Gluconate Cloth  6 each Topical Daily  . diltiazem  30 mg Oral Q6H  . feeding supplement  1 Container Oral TID WC & HS  . guaiFENesin  1,200 mg Oral BID  . insulin aspart  0-6 Units Subcutaneous TID WC  . ipratropium-albuterol  3 mL Nebulization Q6H  . mouth rinse  15 mL Mouth Rinse BID  . methocarbamol  500 mg Oral QID  . metoprolol tartrate  50 mg Oral BID  . pantoprazole  40 mg Oral BID AC  . sodium bicarbonate  650 mg Oral QID  . sodium chloride flush  3 mL Intravenous Q12H  . Warfarin - Pharmacist Dosing Inpatient   Does not apply q1800    Infusions:  . sodium chloride    . azithromycin Stopped (10/22/19 0415)  . lactated ringers 75 mL/hr at 10/22/19 0730  . piperacillin-tazobactam (ZOSYN)  IV 12.5 mL/hr at 10/22/19 0730   PRN: sodium chloride, acetaminophen **OR** acetaminophen, bisacodyl, levalbuterol, ondansetron **OR** ondansetron (ZOFRAN) IV, polyethylene glycol, sodium chloride, sodium chloride flush Anti-infectives (From admission, onward)   Start     Dose/Rate Route Frequency Ordered Stop   10/20/19 0200  azithromycin (ZITHROMAX) 500 mg in sodium chloride 0.9 % 250 mL IVPB     500 mg 250 mL/hr over 60 Minutes Intravenous Every 24 hours 10/19/19 0622     10/19/19 1400  piperacillin-tazobactam (ZOSYN) IVPB 3.375 g     3.375 g 12.5 mL/hr over 240 Minutes Intravenous Every 8 hours 10/19/19 0635     10/19/19 0700  piperacillin-tazobactam (ZOSYN) IVPB 3.375 g     3.375 g 100 mL/hr over 30 Minutes Intravenous  Once 10/19/19 0635 10/19/19 0810   10/19/19 0315  metroNIDAZOLE (FLAGYL) IVPB 500 mg     500 mg 100 mL/hr over 60 Minutes Intravenous  Once 10/19/19 0314 10/19/19 0431   10/19/19 0100  cefTRIAXone (ROCEPHIN) 1 g in sodium chloride 0.9 % 100 mL IVPB     1 g 200 mL/hr over 30 Minutes Intravenous  Once 10/19/19 0053 10/19/19 0215   10/19/19 0100  azithromycin (ZITHROMAX) 500 mg in sodium chloride 0.9 % 250 mL IVPB     500 mg 250 mL/hr over 60 Minutes Intravenous  Once 10/19/19 0053 10/19/19 0227      Goal of Therapy:  INR 2-3 Monitor platelets by anticoagulation protocol: Yes    Prior to Admission Warfarin Dosing:  Lennie Odor takes 6mg  of warfarin daily       Admit INR was 10.2 Lab Results  Component Value Date   INR 2.5 (H) 10/22/2019   INR 1.8 (H) 10/20/2019   INR 10.2 (HH) 10/18/2019    Assessment: Aldine Grainger a 74 y.o. male requires anticoagulation with warfarin for the indication of  atrial fibrillation. Warfarin will be initiated inpatient following pharmacy protocol per pharmacy  consult.   Vitamin k 2.5mg  po give 1/3 am . 1/5 INR has trended down 10.2>> 1.8.  1/6: INR with large jump from 1.8 to 2.5.  Will be cautious with today's dose  Plan: Warfarin 1 mg po x 1  Monitor daily INR and s/s of bleeding.  Margot Ables, PharmD Clinical Pharmacist 10/22/2019 11:14 AM

## 2019-10-22 NOTE — Progress Notes (Signed)
Pt started coughing and spitting up moderate frank red blood. Dr. Roderic Palau made aware and was able to come visualize. New orders obtained will continue to monitor. MD would like pt to stay on unit for monitoring.

## 2019-10-22 NOTE — Telephone Encounter (Signed)
Please arrange outpatient hospital follow-up visit in 2 weeks. We need to assess readiness for EGD. On coumadin.

## 2019-10-22 NOTE — Evaluation (Signed)
Physical Therapy Evaluation Patient Details Name: Blake Wise MRN: [id number] DOB: 10-Jul-1946 Today's Date: 10/22/2019   History of Present Illness  Blake Wise is a 74 y.o. male with medical history significant of atrial fibrillation, congestive heart failure, COPD, hypertension, hyperlipidemia, diabetes mellitus type 2 who presented to the ER with worsening shortness of breath, abdominal pain.  Patient is states he has been having left upper quadrant abdominal pain for the last 1 week.  He is also had multiple episodes of vomiting.  Patient reports progressively worsening shortness of breath, described as dyspnea on exertion.  He is also had a cough with bloody sputum over the last 2 days.  Today he had palpitations which persisted through the day.  No chest pain reported.  No fever, chills.  No hematemesis, melena, hematochezia, dysuria, diarrhea, syncope, seizures, dizziness, lightheadedness, focal deficits, blurring of vision.    Clinical Impression  Patient functioning near baseline for functional mobility and gait, limited mostly due to c/o SOB and frequently spitting up blood and limited to taking a few steps to transfer to chair - RN/MD notified.  Patient tolerated sitting up in chair after therapy.  Patient will benefit from continued physical therapy in hospital and recommended venue below to increase strength, balance, endurance for safe ADLs and gait.     Follow Up Recommendations Home health PT    Equipment Recommendations  None recommended by PT    Recommendations for Other Services       Precautions / Restrictions Precautions Precautions: Fall Restrictions Weight Bearing Restrictions: No Other Position/Activity Restrictions: Blake Wise is a 74 y.o. male with medical history significant of atrial fibrillation, congestive heart failure, COPD, hypertension, hyperlipidemia, diabetes mellitus type 2 who presented to the ER with worsening shortness of breath, abdominal pain.   Patient is states he has been having left upper quadrant abdominal pain for the last 1 week.  He is also had multiple episodes of vomiting.  Patient reports progressively worsening shortness of breath, described as dyspnea on exertion.  He is also had a cough with bloody sputum over the last 2 days.  Today he had palpitations which persisted through the day.  No chest pain reported.  No fever, chills.  No hematemesis, melena, hematochezia, dysuria, diarrhea, syncope, seizures, dizziness, lightheadedness, focal deficits, blurring of vision.      Mobility  Bed Mobility Overal bed mobility: Modified Independent             General bed mobility comments: slightly increased time  Transfers Overall transfer level: Needs assistance Equipment used: None Transfers: Sit to/from Stand;Stand Pivot Transfers Sit to Stand: Supervision Stand pivot transfers: Supervision       General transfer comment: increased time, slightly labored movement  Ambulation/Gait Ambulation/Gait assistance: Supervision;Min guard Gait Distance (Feet): 5 Feet Assistive device: None Gait Pattern/deviations: Decreased step length - right;Decreased step length - left;Decreased stride length Gait velocity: decreased   General Gait Details: limited to 5-6 steps at bedside due to c/o SOB, and frequently spitting up blood  Stairs            Wheelchair Mobility    Modified Rankin (Stroke Patients Only)       Balance Overall balance assessment: Mild deficits observed, not formally tested                                           Pertinent Vitals/Pain Pain  Assessment: 0-10 Pain Score: 5  Pain Location: left side of abdomen Pain Descriptors / Indicators: Aching;Sore Pain Intervention(s): Limited activity within patient's tolerance;Monitored during session;Premedicated before session    Blake Wise expects to be discharged to:: Private residence Living Arrangements:  Non-relatives/Friends Available Help at Discharge: Friend(s);Available 24 hours/day Type of Home: House Home Access: Stairs to enter   [employer] of Steps: 1 Home Layout: One level Home Equipment: Cane - single point;Shower seat      Prior Function Level of Independence: Independent         Comments: Hydrographic surveyor, drives     Journalist, newspaper        Extremity/Trunk Assessment   Upper Extremity Assessment Upper Extremity Assessment: Overall WFL for tasks assessed    Lower Extremity Assessment Lower Extremity Assessment: Generalized weakness    Cervical / Trunk Assessment Cervical / Trunk Assessment: Normal  Communication   Communication: No difficulties  Cognition Arousal/Alertness: Awake/alert Behavior During Therapy: WFL for tasks assessed/performed Overall Cognitive Status: Within Functional Limits for tasks assessed                                        General Comments      Exercises     Assessment/Plan    PT Assessment Patient needs continued PT services  PT Problem List Decreased strength;Decreased activity tolerance;Decreased balance;Decreased mobility       PT Treatment Interventions Balance training;Stair training;Functional mobility training;Therapeutic activities;Therapeutic exercise;Gait training;Patient/family education    PT Goals (Current goals can be found in the Care Plan section)  Acute Rehab PT Goals Patient Stated Goal: return home with friends to assist PT Goal Formulation: With patient Time For Goal Achievement: 10/29/19 Potential to Achieve Goals: Good    Frequency Min 3X/week   Barriers to discharge        Co-evaluation               AM-PAC PT "6 Clicks" Mobility  Outcome Measure Help needed turning from your back to your side while in a flat bed without using bedrails?: None Help needed moving from lying on your back to sitting on the side of a flat bed without using bedrails?:  None Help needed moving to and from a bed to a chair (including a wheelchair)?: A Little Help needed standing up from a chair using your arms (e.g., wheelchair or bedside chair)?: None Help needed to walk in hospital room?: A Little Help needed climbing 3-5 steps with a railing? : A Little 6 Click Score: 21    End of Session Equipment Utilized During Treatment: Oxygen Activity Tolerance: Patient tolerated treatment well;Patient limited by fatigue Patient left: in chair;with call bell/phone within reach Nurse Communication: Mobility status PT Visit Diagnosis: Unsteadiness on feet (R26.81);Other abnormalities of gait and mobility (R26.89);Muscle weakness (generalized) (M62.81)    Time: 5170-0174 PT Time Calculation (min) (ACUTE ONLY): 25 min   Charges:   PT Evaluation $PT Eval Moderate Complexity: 1 Mod PT Treatments $Therapeutic Activity: 23-37 mins        4:08 PM, 10/22/19 Lonell Grandchild, MPT Physical Therapist with [facility] [phone] office [phone] mobile phone

## 2019-10-22 NOTE — Progress Notes (Signed)
Pt has a follow up appointment with Vicente Males, at [facility] on 11/07/2019 at 0930.

## 2019-10-23 LAB — GLUCOSE, CAPILLARY
Glucose-Capillary: 149 mg/dL — ABNORMAL HIGH (ref 70–99)
Glucose-Capillary: 172 mg/dL — ABNORMAL HIGH (ref 70–99)
Glucose-Capillary: 132 mg/dL — ABNORMAL HIGH (ref 70–99)
Glucose-Capillary: 161 mg/dL — ABNORMAL HIGH (ref 70–99)

## 2019-10-23 LAB — RENAL FUNCTION PANEL
Albumin: 2.7 g/dL — ABNORMAL LOW (ref 3.5–5.0)
Anion gap: 10 (ref 5–15)
BUN: 23 mg/dL (ref 8–23)
CO2: 26 mmol/L (ref 22–32)
Calcium: 8.2 mg/dL — ABNORMAL LOW (ref 8.9–10.3)
Chloride: 108 mmol/L (ref 98–111)
Creatinine, Ser: 1.54 mg/dL — ABNORMAL HIGH (ref 0.61–1.24)
GFR calc Af Amer: 51 mL/min — ABNORMAL LOW (ref >60)
GFR calc non Af Amer: 44 mL/min — ABNORMAL LOW (ref >60)
Glucose, Bld: 187 mg/dL — ABNORMAL HIGH (ref 70–99)
Phosphorus: 2.2 mg/dL — ABNORMAL LOW (ref 2.5–4.6)
Potassium: 4.5 mmol/L (ref 3.5–5.1)
Sodium: 144 mmol/L (ref 135–145)

## 2019-10-23 LAB — PROTIME-INR
INR: 3.4 — ABNORMAL HIGH (ref 0.8–1.2)
Prothrombin Time: 34.1 seconds — ABNORMAL HIGH (ref 11.4–15.2)

## 2019-10-23 LAB — CBC
HCT: 27 % — ABNORMAL LOW (ref 39.0–52.0)
Hemoglobin: 8.5 g/dL — ABNORMAL LOW (ref 13.0–17.0)
MCH: 32 pg (ref 26.0–34.0)
MCHC: 31.5 g/dL (ref 30.0–36.0)
MCV: 101.5 fL — ABNORMAL HIGH (ref 80.0–100.0)
Platelets: 302 10*3/uL (ref 150–400)
RBC: 2.66 MIL/uL — ABNORMAL LOW (ref 4.22–5.81)
RDW: 18.3 % — ABNORMAL HIGH (ref 11.5–15.5)
WBC: 14.5 10*3/uL — ABNORMAL HIGH (ref 4.0–10.5)
nRBC: 1.4 % — ABNORMAL HIGH (ref 0.0–0.2)

## 2019-10-23 MED ORDER — PHYTONADIONE 5 MG PO TABS
2.5000 mg | ORAL_TABLET | Freq: Once | ORAL | Status: AC
  Administered 2019-10-23: 11:00:00 2.5 mg via ORAL
  Filled 2019-10-23: qty 1

## 2019-10-23 NOTE — Progress Notes (Signed)
Pharmacy Antibiotic Note  Blake Wise is a 74 y.o. male admitted on 10/18/2019 with intra-abdominal infection.  Pharmacy has been consulted for Zosyn dosing.  Plan: Zosyn 3.375g IV q8h (4-hr infusion)  Pharmacy to monitor cultures, labs and patient progress.  Height: 5\' 11"  (180.3 cm) Weight: 120 lb 5.9 oz (54.6 kg) IBW/kg (Calculated) : 75.3  Temp (24hrs), Avg:97.9 F (36.6 C), Min:97.6 F (36.4 C), Max:98.2 F (36.8 C)  Recent Labs  Lab 10/18/19 2318 10/18/19 2355 10/19/19 0312 10/19/19 0519 10/19/19 1049 10/19/19 1529 10/20/19 0450 10/21/19 0343 10/22/19 0448 10/23/19 0513  WBC 15.3*  --   --   --   --   --  12.5* 12.4* 12.2* 14.5*  CREATININE  --  1.61*  --   --   --   --  1.59* 1.73* 1.57* 1.54*  LATICACIDVEN  --   --  2.4* 1.2 1.4 1.5 1.2  --   --   --     Estimated Creatinine Clearance: 32.5 mL/min (A) (by C-G formula based on SCr of 1.54 mg/dL (H)).    No Known Allergies  Antimicrobials this admission: Zosyn 1/3>>   azithromycin 1/3 >> ceftriaxone 1/3 >> x1 dose metronidazole 1/3>>x1 dose   Microbiology results: 1/3 BC x2:  ngtd 1/3 Resp PCR: negative for SARS Co-V-2 and Flu A/B 1/3 MRSA PCR: neg  Thank you for allowing pharmacy to be a part of this patient's care.  Ramond Craver 10/23/2019 10:37 AM

## 2019-10-23 NOTE — Progress Notes (Signed)
Assisted tele visit to patient with son.  Quin Mathenia R, RN  

## 2019-10-23 NOTE — Care Management Important Message (Signed)
Important Message  Patient Details  Name: Blake Wise MRN: [id number] Date of Birth: 07-Dec-1945   Medicare Important Message Given:  Yes     [name] 10/23/2019, 2:27 PM

## 2019-10-23 NOTE — Progress Notes (Signed)
ANTICOAGULATION CONSULT NOTE -   Pharmacy Consult for warfarin dosing  Indication: atrial fibrillation   No Known Allergies    Patient Measurements: Last Weight  Most recent update: 10/22/2019  6:30 AM   Weight  54.6 kg (120 lb 5.9 oz)           Body mass index is 16.79 kg/m. Blake Wise               Temp: 98.2 F (36.8 C) (01/07 0802) Temp Source: Oral (01/07 0802) BP: 138/92 (01/07 1000) Pulse Rate: 87 (01/07 1000)  Labs: Recent Labs    10/21/19 0343 10/22/19 0448 10/23/19 0513  HGB 7.4* 7.7* 8.5*  HCT 24.2* 25.0* 27.0*  PLT 246 278 302  LABPROT  --  27.3* 34.1*  INR  --  2.5* 3.4*  CREATININE 1.73* 1.57* 1.54*    Estimated Creatinine Clearance: 32.5 mL/min (A) (by C-G formula based on SCr of 1.54 mg/dL (H)).     Medications:  Medications Prior to Admission  Medication Sig Dispense Refill Last Dose  . albuterol (VENTOLIN HFA) 108 (90 Base) MCG/ACT inhaler Inhale into the lungs.   10/18/2019 at Unknown time  . atorvastatin (LIPITOR) 10 MG tablet Take 10 mg by mouth daily.   10/17/2019 at Unknown time  . Cholecalciferol 25 MCG (1000 UT) tablet Take by mouth.   10/17/2019  . diltiazem (TIAZAC) 240 MG 24 hr capsule Take 240 mg by mouth daily.   10/17/2019  . furosemide (LASIX) 20 MG tablet Take 20 mg by mouth daily.   10/17/2019 at Unknown time  . glimepiride (AMARYL) 1 MG tablet Take 1 mg by mouth daily.   10/17/2019  . hydrALAZINE (APRESOLINE) 10 MG tablet Take 10 mg by mouth 3 (three) times daily.   10/17/2019 at Unknown time  . ipratropium-albuterol (DUONEB) 0.5-2.5 (3) MG/3ML SOLN Inhale 3 mLs into the lungs every 6 (six) hours as needed (breathing).    10/17/2019  . isosorbide mononitrate (IMDUR) 30 MG 24 hr tablet Take 30 mg by mouth daily.   10/17/2019 at Unknown time  . lisinopril (ZESTRIL) 10 MG tablet Take 10 mg by mouth daily.    10/17/2019  . metoprolol tartrate (LOPRESSOR) 50 MG tablet Take 50 mg by mouth 2 (two) times daily.   10/17/2019 at Unknown time  . mirtazapine  (REMERON) 15 MG tablet Take 15 mg by mouth at bedtime.    10/17/2019  . Multiple Vitamins-Minerals (MENS ONE DAILY PO) Take 1 tablet by mouth daily.   10/17/2019  . Omega-3 Fatty Acids (FISH OIL) 1000 MG CAPS Take 1 capsule by mouth daily.    10/17/2019  . sitaGLIPtin (JANUVIA) 50 MG tablet Take 50 mg by mouth daily.   10/17/2019 at Unknown time  . sodium bicarbonate 650 MG tablet Take 650 mg by mouth 4 (four) times daily.   10/17/2019 at Unknown time  . SPIRIVA HANDIHALER 18 MCG inhalation capsule 1 capsule daily.   10/17/2019  . SYMBICORT 160-4.5 MCG/ACT inhaler Inhale 1 puff into the lungs daily.    10/17/2019  . warfarin (COUMADIN) 2 MG tablet Take 2 mg by mouth See admin instructions. Take 2 mg tablet every Wed, Thurs, Friday, and Saturday along with 4 mg for a total of 6 mg   10/17/2019  . warfarin (COUMADIN) 4 MG tablet Take 4 mg by mouth See admin instructions. Take 1 tablet daily.  Take along with 2 mg tablet on Wed, Thurs, Friday, and Saturday for a total of 6 mg.  10/17/2019 at Unknown time  . COLCRYS 0.6 MG tablet Take 0.6 mg by mouth 2 (two) times daily.   unknown  . diltiazem (TIAZAC) 360 MG 24 hr capsule Take 360 mg by mouth daily.     . methocarbamol (ROBAXIN) 500 MG tablet Take 500 mg by mouth 4 (four) times daily.   Not Taking at Unknown time   Scheduled:  . atorvastatin  10 mg Oral Daily  . Chlorhexidine Gluconate Cloth  6 each Topical Daily  . diltiazem  30 mg Oral Q12H  . feeding supplement  1 Container Oral TID WC & HS  . guaiFENesin  1,200 mg Oral BID  . insulin aspart  0-6 Units Subcutaneous TID WC  . ipratropium-albuterol  3 mL Nebulization Q6H  . mouth rinse  15 mL Mouth Rinse BID  . methocarbamol  500 mg Oral QID  . methylPREDNISolone (SOLU-MEDROL) injection  60 mg Intravenous Q12H  . metoprolol tartrate  50 mg Oral BID  . pantoprazole  40 mg Oral BID AC  . phytonadione  2.5 mg Oral Once  . sodium bicarbonate  650 mg Oral QID  . sodium chloride flush  3 mL Intravenous Q12H  .  Warfarin - Pharmacist Dosing Inpatient   Does not apply q1800   Infusions:  . sodium chloride    . azithromycin 250 mL/hr at 10/23/19 0500  . piperacillin-tazobactam (ZOSYN)  IV 3.375 g (10/23/19 0553)   PRN: sodium chloride, acetaminophen **OR** acetaminophen, bisacodyl, HYDROcodone-homatropine, levalbuterol, ondansetron **OR** ondansetron (ZOFRAN) IV, polyethylene glycol, sodium chloride, sodium chloride flush Anti-infectives (From admission, onward)   Start     Dose/Rate Route Frequency Ordered Stop   10/20/19 0200  azithromycin (ZITHROMAX) 500 mg in sodium chloride 0.9 % 250 mL IVPB     500 mg 250 mL/hr over 60 Minutes Intravenous Every 24 hours 10/19/19 0622     10/19/19 1400  piperacillin-tazobactam (ZOSYN) IVPB 3.375 g     3.375 g 12.5 mL/hr over 240 Minutes Intravenous Every 8 hours 10/19/19 0635     10/19/19 0700  piperacillin-tazobactam (ZOSYN) IVPB 3.375 g     3.375 g 100 mL/hr over 30 Minutes Intravenous  Once 10/19/19 0635 10/19/19 0810   10/19/19 0315  metroNIDAZOLE (FLAGYL) IVPB 500 mg     500 mg 100 mL/hr over 60 Minutes Intravenous  Once 10/19/19 0314 10/19/19 0431   10/19/19 0100  cefTRIAXone (ROCEPHIN) 1 g in sodium chloride 0.9 % 100 mL IVPB     1 g 200 mL/hr over 30 Minutes Intravenous  Once 10/19/19 0053 10/19/19 0215   10/19/19 0100  azithromycin (ZITHROMAX) 500 mg in sodium chloride 0.9 % 250 mL IVPB     500 mg 250 mL/hr over 60 Minutes Intravenous  Once 10/19/19 0053 10/19/19 0227      Goal of Therapy:  INR 2-3 Monitor platelets by anticoagulation protocol: Yes    Prior to Admission Warfarin Dosing:  Blake Wise takes 6mg  of warfarin daily       Admit INR was 10.2 Lab Results  Component Value Date   INR 3.4 (H) 10/23/2019   INR 2.5 (H) 10/22/2019   INR 1.8 (H) 10/20/2019    Assessment: Blake Wise a 74 y.o. male requires anticoagulation with warfarin for the indication of  atrial fibrillation. Warfarin will be initiated inpatient following  pharmacy protocol per pharmacy consult.   Vitamin k 2.5mg  po give 1/3 am . 1/5 INR has trended down 10.2>> 1.8.   1/6: INR with large jump from 1.8  to 2.5.  Will be cautious with today's dose 1/7: Coumadin currently on hold in the setting of anemia/bleeding. INR 3.4. Vitamin K 2.5 mg PO given.  Plan: Hold warfarin due to anemia/bleeding. Follow-up restart  Margot Ables, PharmD Clinical Pharmacist 10/23/2019 10:34 AM

## 2019-10-23 NOTE — Progress Notes (Signed)
PROGRESS NOTE    Blake Wise  UKG:[id number] DOB: 1946/08/12 DOA: 10/18/2019 PCP: Patient, No Pcp Per   Brief Narrative:  Per HPI: Blake Wise a 74 y.o.malewith medical history significant ofatrial fibrillation, congestive heart failure, COPD, hypertension, hyperlipidemia, diabetes mellitus type 2 who presented to the ER with worsening shortness of breath, abdominal pain. Patient is states he has been having left upper quadrant abdominal pain for the last 1 week. He is also had multiple episodes of vomiting. Patient reports progressively worsening shortness of breath, described as dyspnea on exertion.He is also had a cough with bloody sputum over the last 2 days. Today he had palpitations which persisted through the day. No chest pain reported. No fever, chills. No hematemesis, melena, hematochezia, dysuria, diarrhea, syncope, seizures, dizziness, lightheadedness, focal deficits, blurring of vision.  Assessment & Plan:   Principal Problem:   Atrial fibrillation with RVR (HCC) Active Problems:   Chronic diastolic CHF (congestive heart failure) (HCC)   Chest pain   DM2 (diabetes mellitus, type 2) (HCC)   HTN (hypertension)   Acute urinary retention   Elevated INR   Community acquired pneumonia   Enteritis   Acute blood loss anemia   Sepsis-multifactorial with enteritis as well as right-sided pneumonia with concern for aspiration-improving -Continue on Zosyn and azithromycin as ordered -Continue on gentle IV fluid for now -Blood culture with no growth x3 days -No further diarrhea, vomiting, abdominal pain -Shortness of breath is slowly improving.  Atrial fibrillation with RVR secondary to above-resolved -IV Cardizem drip discontinued and patient transition to oral Cardizem 30 mg every 12 hours  -Continue on home dose of metoprolol -2D echo with LVEF 50-55% -Patient had supratherapeutic INR which has now resolved -Coumadin currently on hold in light of hemoptysis   Acute anemia-suspect blood loss -Patient with 2 dark bowel movements prior to admission and had a significant drop in hemoglobin levels with soft blood pressure readings -Unclear if this is truly related to blood loss versus hemodilution -Seen by GI with plans for outpatient endoscopy unless he has gross bleeding or transfusion dependent anemia -Hemoglobin is currently stable -Received a dose of IV iron -Coumadin currently on hold in the setting of anemia/bleeding  Hemoptysis -Patient noted to have frank blood with hemoptysis today.   -This has progressively gotten worse over the past 1 to 2 days -We will hold further anticoagulation -CT chest shows pneumonia versus pulmonary hemorrhage -Discussed with Dr. Halford Chessman, recommended continuing to treat pneumonia and reverse INR.  If hemoptysis persists, may need further investigation  Mild acute on chronic hypoxemic respiratory failure -Currently on 3 L and baseline 2 L at home -Wean as tolerated  Supratherapeutic INR-resolved -Appreciate pharmacy management -Given vitamin K on 1/3, 1/7  Troponin elevation likely secondary to demand ischemia -No chest pain or findings of ACS on EKG  Chronic diastolic congestive heart failure -No volume overload currently noted -Follow daily weights -Repeat 2D echocardiogram with noted LVEF 50-55% -Lasix on hold due to sepsis physiology, will need to be reinitiated once this resolves  Hypertension-stable -Continue metoprolol as well as Cardizem and monitor carefully  Type 2 diabetes -A1c 5.8 -Hold Januvia for now -Blood sugars stable -Anticipate blood sugar should increase since he is receiving steroids.  Chronic COPD on home 2 L nasal cannula oxygen -Patient is still short of breath has rhonchi and wheezing -Continue bronchodilators -Continue intravenous steroids   DVT prophylaxis: Coumadin with INR 2.5 Code Status: Full Family Communication: None at bedside Disposition Plan:   Continue careful  monitoring and transfuse as needed.  Continue current antibiotics.  Monitor heart rate and blood pressure on current medications.  Further evaluation for hemoptysis.   Consultants:   GI  Procedures:   None  Antimicrobials:  Anti-infectives (From admission, onward)   Start     Dose/Rate Route Frequency Ordered Stop   10/20/19 0200  azithromycin (ZITHROMAX) 500 mg in sodium chloride 0.9 % 250 mL IVPB     500 mg 250 mL/hr over 60 Minutes Intravenous Every 24 hours 10/19/19 0622     10/19/19 1400  piperacillin-tazobactam (ZOSYN) IVPB 3.375 g     3.375 g 12.5 mL/hr over 240 Minutes Intravenous Every 8 hours 10/19/19 0635     10/19/19 0700  piperacillin-tazobactam (ZOSYN) IVPB 3.375 g     3.375 g 100 mL/hr over 30 Minutes Intravenous  Once 10/19/19 0635 10/19/19 0810   10/19/19 0315  metroNIDAZOLE (FLAGYL) IVPB 500 mg     500 mg 100 mL/hr over 60 Minutes Intravenous  Once 10/19/19 0314 10/19/19 0431   10/19/19 0100  cefTRIAXone (ROCEPHIN) 1 g in sodium chloride 0.9 % 100 mL IVPB     1 g 200 mL/hr over 30 Minutes Intravenous  Once 10/19/19 0053 10/19/19 0215   10/19/19 0100  azithromycin (ZITHROMAX) 500 mg in sodium chloride 0.9 % 250 mL IVPB     500 mg 250 mL/hr over 60 Minutes Intravenous  Once 10/19/19 0053 10/19/19 0227      Subjective: Patient has continued cough.  Feels that hemoptysis may be a little better today.  Still short of breath.  Feels that he is not breathing well on the left side.  Objective: Vitals:   10/23/19 1900 10/23/19 1953 10/23/19 2005 10/23/19 2057  BP: (!) 126/93  (!) 154/98 (!) 156/110  Pulse: 76  84 76  Resp: 17  19 19   Temp:    97.6 F (36.4 C)  TempSrc:    Oral  SpO2: 94% 100% 97% 99%  Weight:      Height:        Intake/Output Summary (Last 24 hours) at 10/23/2019 2101 Last data filed at 10/23/2019 1809 Gross per 24 hour  Intake 455.01 ml  Output 1200 ml  Net -744.99 ml   Filed Weights   10/20/19 0500 10/21/19 0500  10/22/19 0500  Weight: 54.7 kg 54.1 kg 54.6 kg    Examination:  General exam: Alert, awake, oriented x 3 Respiratory system: Mild wheeze bilaterally. Respiratory effort normal. Cardiovascular system:RRR. No murmurs, rubs, gallops. Gastrointestinal system: Abdomen is nondistended, soft and nontender. No organomegaly or masses felt. Normal bowel sounds heard. Central nervous system: Alert and oriented. No focal neurological deficits. Extremities: No C/C/E, +pedal pulses Skin: No rashes, lesions or ulcers Psychiatry: Judgement and insight appear normal. Mood & affect appropriate.    Data Reviewed: I have personally reviewed following labs and imaging studies  CBC: Recent Labs  Lab 10/18/19 2318 10/20/19 0450 10/20/19 1028 10/21/19 0343 10/22/19 0448 10/23/19 0513  WBC 15.3* 12.5*  --  12.4* 12.2* 14.5*  NEUTROABS 13.3*  --   --   --   --   --   HGB 11.0* 7.2* 7.2* 7.4* 7.7* 8.5*  HCT 34.3* 22.4* 23.2* 24.2* 25.0* 27.0*  MCV 96.9 100.0  --  100.4* 100.4* 101.5*  PLT 349 236  --  246 278 976   Basic Metabolic Panel: Recent Labs  Lab 10/18/19 2355 10/20/19 0450 10/21/19 0343 10/22/19 0448 10/23/19 0513  NA 147* 144 143 143 144  K 4.1 3.3* 4.1 3.7 4.5  CL 103 104 106 106 108  CO2 29 29 25 25 26   GLUCOSE 156* 61* 113* 120* 187*  BUN 35* 33* 27* 22 23  CREATININE 1.61* 1.59* 1.73* 1.57* 1.54*  CALCIUM 8.6* 7.8* 7.9* 8.3* 8.2*  MG 2.0 2.2 2.2  --   --   PHOS  --   --   --   --  2.2*   GFR: Estimated Creatinine Clearance: 32.5 mL/min (A) (by C-G formula based on SCr of 1.54 mg/dL (H)). Liver Function Tests: Recent Labs  Lab 10/18/19 2355 10/20/19 0450 10/23/19 0513  AST 55* 40  --   ALT 43 32  --   ALKPHOS 76 47  --   BILITOT 2.5* 1.7*  --   PROT 7.2 5.3*  --   ALBUMIN 3.5 2.6* 2.7*   Recent Labs  Lab 10/18/19 2355  LIPASE 14   No results for input(s): AMMONIA in the last 168 hours. Coagulation Profile: Recent Labs  Lab 10/18/19 2319 10/20/19 0450  10/22/19 0448 10/23/19 0513  INR 10.2* 1.8* 2.5* 3.4*   Cardiac Enzymes: No results for input(s): CKTOTAL, CKMB, CKMBINDEX, TROPONINI in the last 168 hours. BNP (last 3 results) No results for input(s): PROBNP in the last 8760 hours. HbA1C: No results for input(s): HGBA1C in the last 72 hours. CBG: Recent Labs  Lab 10/22/19 1641 10/22/19 2116 10/23/19 0804 10/23/19 1114 10/23/19 1619  GLUCAP 192* 193* 149* 172* 132*   Lipid Profile: No results for input(s): CHOL, HDL, LDLCALC, TRIG, CHOLHDL, LDLDIRECT in the last 72 hours. Thyroid Function Tests: No results for input(s): TSH, T4TOTAL, FREET4, T3FREE, THYROIDAB in the last 72 hours. Anemia Panel: No results for input(s): VITAMINB12, FOLATE, FERRITIN, TIBC, IRON, RETICCTPCT in the last 72 hours. Sepsis Labs: Recent Labs  Lab 10/19/19 0519 10/19/19 1049 10/19/19 1529 10/20/19 0450  LATICACIDVEN 1.2 1.4 1.5 1.2    Recent Results (from the past 240 hour(s))  Respiratory Panel by RT PCR (Flu A&B, Covid) - Nasopharyngeal Swab     Status: None   Collection Time: 10/18/19 11:18 PM   Specimen: Nasopharyngeal Swab  Result Value Ref Range Status   SARS Coronavirus 2 by RT PCR NEGATIVE NEGATIVE Final    Comment: (NOTE) SARS-CoV-2 target nucleic acids are NOT DETECTED. The SARS-CoV-2 RNA is generally detectable in upper respiratoy specimens during the acute phase of infection. The lowest concentration of SARS-CoV-2 viral copies this assay can detect is 131 copies/mL. A negative result does not preclude SARS-Cov-2 infection and should not be used as the sole basis for treatment or other patient management decisions. A negative result may occur with  improper specimen collection/handling, submission of specimen other than nasopharyngeal swab, presence of viral mutation(s) within the areas targeted by this assay, and inadequate number of viral copies (<131 copies/mL). A negative result must be combined with clinical  observations, patient history, and epidemiological information. The expected result is Negative. Fact Sheet for Patients:  PinkCheek.be Fact Sheet for Healthcare Providers:  GravelBags.it This test is not yet ap proved or cleared by the [place] FDA and  has been authorized for detection and/or diagnosis of SARS-CoV-2 by FDA under an Emergency Use Authorization (EUA). This EUA will remain  in effect (meaning this test can be used) for the duration of the COVID-19 declaration under Section 564(b)(1) of the Act, 21 U.S.C. section 360bbb-3(b)(1), unless the authorization is terminated or revoked sooner.    Influenza A by PCR NEGATIVE NEGATIVE Final  Influenza B by PCR NEGATIVE NEGATIVE Final    Comment: (NOTE) The Xpert Xpress SARS-CoV-2/FLU/RSV assay is intended as an aid in  the diagnosis of influenza from Nasopharyngeal swab specimens and  should not be used as a sole basis for treatment. Nasal washings and  aspirates are unacceptable for Xpert Xpress SARS-CoV-2/FLU/RSV  testing. Fact Sheet for Patients: PinkCheek.be Fact Sheet for Healthcare Providers: GravelBags.it This test is not yet approved or cleared by the [place] FDA and  has been authorized for detection and/or diagnosis of SARS-CoV-2 by  FDA under an Emergency Use Authorization (EUA). This EUA will remain  in effect (meaning this test can be used) for the duration of the  Covid-19 declaration under Section 564(b)(1) of the Act, 21  U.S.C. section 360bbb-3(b)(1), unless the authorization is  terminated or revoked. Performed at [facility], [address] [place], [place] [address]   Blood culture (routine x 2)     Status: None (Preliminary result)   Collection Time: 10/19/19  3:12 AM   Specimen: BLOOD LEFT ARM  Result Value Ref Range Status   Specimen Description BLOOD LEFT ARM  Final    Special Requests   Final    BOTTLES DRAWN AEROBIC ONLY Blood Culture adequate volume   Culture   Final    NO GROWTH 4 DAYS Performed at [facility], [address], [place], [place] [address]    Report Status PENDING  Incomplete  Blood culture (routine x 2)     Status: None (Preliminary result)   Collection Time: 10/19/19  5:19 AM   Specimen: Left Antecubital; Blood  Result Value Ref Range Status   Specimen Description LEFT ANTECUBITAL  Final   Special Requests   Final    BOTTLES DRAWN AEROBIC AND ANAEROBIC Blood Culture adequate volume   Culture   Final    NO GROWTH 4 DAYS Performed at [facility], [address], [place], [place] [address]    Report Status PENDING  Incomplete  MRSA PCR Screening     Status: None   Collection Time: 10/19/19  6:17 AM   Specimen: Nasopharyngeal  Result Value Ref Range Status   MRSA by PCR NEGATIVE NEGATIVE Final    Comment:        The GeneXpert MRSA Assay (FDA approved for NASAL specimens only), is one component of a comprehensive MRSA colonization surveillance program. It is not intended to diagnose MRSA infection nor to guide or monitor treatment for MRSA infections. Performed at [facility], [address], [place], [place] [address]          Radiology Studies: CT CHEST WO CONTRAST  Result Date: 10/22/2019 CLINICAL DATA:  Cough with hemoptysis. Shortness of breath. Wheezing. EXAM: CT CHEST WITHOUT CONTRAST TECHNIQUE: Multidetector CT imaging of the chest was performed following the standard protocol without IV contrast. COMPARISON:  CT abdomen pelvis 10/19/2019. Images from CT chest 08/22/2013 are not available at the time of dictation. FINDINGS: Cardiovascular: Atherosclerotic calcification of aorta, aortic valve and coronary arteries. Heart is enlarged. No pericardial effusion. Mediastinum/Nodes: No pathologically enlarged mediastinal or axillary lymph nodes. Hilar regions are difficult to definitively evaluate without IV contrast.  Esophagus is grossly unremarkable. Lungs/Pleura: Severe centrilobular emphysema. Image quality is degraded by respiratory motion. Rounded areas of airspace consolidation, probable fluid and ground-glass in the apical aspects of both upper lobes. Patchy ground-glass in the anterior segment right upper lobe and right middle lobe. New small bilateral pleural effusions. Minimal associated compressive atelectasis in both lower lobes. Debris  is seen in the mainstem bronchi, bronchus intermedius and right lower lobe bronchi. Airway is otherwise unremarkable. Upper Abdomen: Visualized portions the liver, adrenal glands, kidneys, spleen, pancreas, stomach and bowel are grossly unremarkable. Calcified upper abdominal lymph nodes. Trace perihepatic fluid. Musculoskeletal: No worrisome lytic or sclerotic lesions. IMPRESSION: 1. Areas of consolidation, probable fluid and ground-glass in the upper lobes and right middle lobe, findings which may represent a combination pneumonia and pulmonary hemorrhage. As it is difficult to exclude a true pulmonary nodule in the upper lobes, follow-up CT chest without contrast in 3-4 weeks is recommended in further evaluation to exclude malignancy. 2. Small bilateral pleural effusions, new from 10/19/2019. 3. Aortic atherosclerosis (ICD10-I70.0). Coronary artery calcification. 4.  Emphysema (ICD10-J43.9). Electronically Signed   By: Lorin Picket M.D.   On: 10/22/2019 16:48        Scheduled Meds: . atorvastatin  10 mg Oral Daily  . Chlorhexidine Gluconate Cloth  6 each Topical Daily  . diltiazem  30 mg Oral Q12H  . feeding supplement  1 Container Oral TID WC & HS  . guaiFENesin  1,200 mg Oral BID  . insulin aspart  0-6 Units Subcutaneous TID WC  . ipratropium-albuterol  3 mL Nebulization Q6H  . mouth rinse  15 mL Mouth Rinse BID  . methocarbamol  500 mg Oral QID  . methylPREDNISolone (SOLU-MEDROL) injection  60 mg Intravenous Q12H  . metoprolol tartrate  50 mg Oral BID  .  pantoprazole  40 mg Oral BID AC  . sodium bicarbonate  650 mg Oral QID  . sodium chloride flush  3 mL Intravenous Q12H  . Warfarin - Pharmacist Dosing Inpatient   Does not apply q1800   Continuous Infusions: . sodium chloride    . azithromycin Stopped (10/23/19 0519)  . piperacillin-tazobactam (ZOSYN)  IV 3.375 g (10/23/19 1435)     LOS: 4 days    Time spent: 30 minutes    Kathie Dike, MD [facility]  If 7PM-7AM, please contact night-coverage [email or web address]  10/23/2019, 9:01 PM

## 2019-10-23 NOTE — TOC Initial Note (Addendum)
Transition of Care [facility]) - Initial/Assessment Note    Patient Details  Name: Blake Wise MRN: [id number] Date of Birth: 1946-01-12  Transition of Care [facility]) CM/SW Contact:    Jamesyn [place], Chauncey Reading, RN Phone Number: 10/23/2019, 11:10 AM  Clinical Narrative:   Sepsis-multifactorial with enteritis as well as right-sided pneumonia with concern for aspiration, anemia, Afib with RVR, hemoptysis. From home, independent. Wears O2 at baseline. Seen by PT, recommended for home health PT. Choice offered to patient, he is agreeable.         PCP: Dr. Sherrie Sport of [facility] [place]          Expected Discharge Plan: [place] Barriers to Discharge: Continued Medical Work up   Patient Goals and CMS Choice Patient states their goals for this hospitalization and ongoing recovery are:: return home CMS Medicare.gov Compare Post Acute Care list provided to:: Patient Choice offered to / list presented to : Patient  Expected Discharge Plan and Services Expected Discharge Plan: [place]   Discharge Planning Services: CM Consult Post Acute Care Choice: Home Health    HH Arranged: PT [place]: [facility] Date [place]: 10/23/19 Time HH Agency Contacted: 1104 Representative spoke with at [place]: [place] Arrangements/Services       Do you feel safe going back to the place where you live?: Yes          Current home services: DME(cane, O2)    Activities of Daily Living Home Assistive Devices/Equipment: Eyeglasses, Dentures (specify type), Cane (specify quad or straight) ADL Screening (condition at time of admission) Patient's cognitive ability adequate to safely complete daily activities?: Yes Is the patient deaf or have difficulty hearing?: No Does the patient have difficulty seeing, even when wearing glasses/contacts?: No Does the patient have difficulty concentrating, remembering, or making  decisions?: No Patient able to express need for assistance with ADLs?: Yes Does the patient have difficulty dressing or bathing?: No Independently performs ADLs?: Yes (appropriate for developmental age) Does the patient have difficulty walking or climbing stairs?: Yes Weakness of Legs: Both Weakness of Arms/Hands: Both      Emotional Assessment       Orientation: : Oriented to Self, Oriented to Place, Oriented to  Time Alcohol / Substance Use: Not Applicable    Admission diagnosis:  Urinary retention [R33.9] Enteritis [K52.9] Atrial fibrillation with rapid ventricular response ([place]) [I48.91] Atrial fibrillation with RVR ([place]) [I48.91] Supratherapeutic INR [R79.1] Community acquired pneumonia of right upper lobe of lung [J18.9] Patient Active Problem List   Diagnosis Date Noted  . Acute blood loss anemia   . Atrial fibrillation with RVR ([place]) 10/19/2019  . Chronic diastolic CHF (congestive heart failure) ([place]) 10/19/2019  . Chest pain 10/19/2019  . DM2 (diabetes mellitus, type 2) ([place]) 10/19/2019  . HTN (hypertension) 10/19/2019  . Acute urinary retention 10/19/2019  . Elevated INR 10/19/2019  . Community acquired pneumonia 10/19/2019  . Enteritis 10/19/2019   PCP:  Patient, No Pcp Per Pharmacy:   [facility] [address], [place] [address] Phone: [phone] Fax: [phone]     Social Determinants of Health (SDOH) Interventions    Readmission Risk Interventions No flowsheet data found.

## 2019-10-24 LAB — BASIC METABOLIC PANEL
Anion gap: 9 (ref 5–15)
BUN: 28 mg/dL — ABNORMAL HIGH (ref 8–23)
CO2: 25 mmol/L (ref 22–32)
Calcium: 8.3 mg/dL — ABNORMAL LOW (ref 8.9–10.3)
Chloride: 107 mmol/L (ref 98–111)
Creatinine, Ser: 1.62 mg/dL — ABNORMAL HIGH (ref 0.61–1.24)
GFR calc Af Amer: 48 mL/min — ABNORMAL LOW (ref >60)
GFR calc non Af Amer: 41 mL/min — ABNORMAL LOW (ref >60)
Glucose, Bld: 128 mg/dL — ABNORMAL HIGH (ref 70–99)
Potassium: 4.4 mmol/L (ref 3.5–5.1)
Sodium: 141 mmol/L (ref 135–145)

## 2019-10-24 LAB — CBC
HCT: 27.1 % — ABNORMAL LOW (ref 39.0–52.0)
Hemoglobin: 8.3 g/dL — ABNORMAL LOW (ref 13.0–17.0)
MCH: 31.6 pg (ref 26.0–34.0)
MCHC: 30.6 g/dL (ref 30.0–36.0)
MCV: 103 fL — ABNORMAL HIGH (ref 80.0–100.0)
Platelets: 352 10*3/uL (ref 150–400)
RBC: 2.63 MIL/uL — ABNORMAL LOW (ref 4.22–5.81)
RDW: 19.6 % — ABNORMAL HIGH (ref 11.5–15.5)
WBC: 16.9 10*3/uL — ABNORMAL HIGH (ref 4.0–10.5)
nRBC: 3.9 % — ABNORMAL HIGH (ref 0.0–0.2)

## 2019-10-24 LAB — CULTURE, BLOOD (ROUTINE X 2)
Culture: NO GROWTH
Culture: NO GROWTH
Special Requests: ADEQUATE
Special Requests: ADEQUATE

## 2019-10-24 LAB — EXPECTORATED SPUTUM ASSESSMENT W GRAM STAIN, RFLX TO RESP C

## 2019-10-24 LAB — GLUCOSE, CAPILLARY
Glucose-Capillary: 123 mg/dL — ABNORMAL HIGH (ref 70–99)
Glucose-Capillary: 126 mg/dL — ABNORMAL HIGH (ref 70–99)
Glucose-Capillary: 141 mg/dL — ABNORMAL HIGH (ref 70–99)
Glucose-Capillary: 140 mg/dL — ABNORMAL HIGH (ref 70–99)

## 2019-10-24 LAB — PROTIME-INR
INR: 2.4 — ABNORMAL HIGH (ref 0.8–1.2)
Prothrombin Time: 26.3 seconds — ABNORMAL HIGH (ref 11.4–15.2)

## 2019-10-24 MED ORDER — ALUM & MAG HYDROXIDE-SIMETH 200-200-20 MG/5ML PO SUSP
30.0000 mL | ORAL | Status: DC | PRN
  Administered 2019-10-24: 04:00:00 30 mL via ORAL
  Filled 2019-10-24: qty 30

## 2019-10-24 MED ORDER — ACETYLCYSTEINE 20 % IN SOLN
4.0000 mL | Freq: Three times a day (TID) | RESPIRATORY_TRACT | Status: DC
  Administered 2019-10-25 – 2019-10-28 (×6): 4 mL via RESPIRATORY_TRACT
  Filled 2019-10-24 (×8): qty 4

## 2019-10-24 MED ORDER — AMOXICILLIN-POT CLAVULANATE 875-125 MG PO TABS
1.0000 | ORAL_TABLET | Freq: Two times a day (BID) | ORAL | Status: AC
  Administered 2019-10-24 – 2019-10-25 (×4): 1 via ORAL
  Filled 2019-10-24 (×4): qty 1

## 2019-10-24 MED ORDER — LORAZEPAM 1 MG PO TABS
1.0000 mg | ORAL_TABLET | Freq: Once | ORAL | Status: AC
  Administered 2019-10-24: 04:00:00 1 mg via ORAL
  Filled 2019-10-24: qty 1

## 2019-10-24 NOTE — Progress Notes (Signed)
Physical Therapy Treatment Patient Details Name: Cypher Paule MRN: [id number] DOB: 1946/09/23 Today's Date: 10/24/2019    History of Present Illness Tonny Isensee is a 74 y.o. male with medical history significant of atrial fibrillation, congestive heart failure, COPD, hypertension, hyperlipidemia, diabetes mellitus type 2 who presented to the ER with worsening shortness of breath, abdominal pain.  Patient is states he has been having left upper quadrant abdominal pain for the last 1 week.  He is also had multiple episodes of vomiting.  Patient reports progressively worsening shortness of breath, described as dyspnea on exertion.  He is also had a cough with bloody sputum over the last 2 days.  Today he had palpitations which persisted through the day.  No chest pain reported.  No fever, chills.  No hematemesis, melena, hematochezia, dysuria, diarrhea, syncope, seizures, dizziness, lightheadedness, focal deficits, blurring of vision.    PT Comments    Pt limited by fatigue and reports of increased SOB today.  No long spitting up blood.  Pt declined getting out of bed due to fatigue/SOB but was willing to complete bed exercises.  Therapeutic exercises complete for LE strengthening.  Vitals assessed through session with O2 saturation levels from 90-97%.  EOS pt left in bed with call bell within reach and bed alarm set.  No reports of increased pain, does c/o constant Lt sided achey soreness through session that did not change with any exercise.     Follow Up Recommendations  Home health PT     Equipment Recommendations  None recommended by PT    Recommendations for Other Services       Precautions / Restrictions Precautions Precautions: Fall Restrictions Weight Bearing Restrictions: No Other Position/Activity Restrictions: Kyion Gautier is a 74 y.o. male with medical history significant of atrial fibrillation, congestive heart failure, COPD, hypertension, hyperlipidemia, diabetes mellitus  type 2 who presented to the ER with worsening shortness of breath, abdominal pain.  Patient is states he has been having left upper quadrant abdominal pain for the last 1 week.  He is also had multiple episodes of vomiting.  Patient reports progressively worsening shortness of breath, described as dyspnea on exertion.  He is also had a cough with bloody sputum over the last 2 days.  Today he had palpitations which persisted through the day.  No chest pain reported.  No fever, chills.  No hematemesis, melena, hematochezia, dysuria, diarrhea, syncope, seizures, dizziness, lightheadedness, focal deficits, blurring of vision.    Mobility  Bed Mobility               General bed mobility comments: Pt requested to stay in bed due to fatigue/SOB  Transfers                    Ambulation/Gait                 Stairs             Wheelchair Mobility    Modified Rankin (Stroke Patients Only)       Balance                                            Cognition Arousal/Alertness: Awake/alert Behavior During Therapy: WFL for tasks assessed/performed Overall Cognitive Status: Within Functional Limits for tasks assessed  Exercises General Exercises - Lower Extremity Ankle Circles/Pumps: Both;20 reps;Supine Quad Sets: Both;10 reps;Supine Gluteal Sets: Both;10 reps;Supine Heel Slides: Both;10 reps;Supine Hip ABduction/ADduction: Both;Supine;10 reps Hip Flexion/Marching: Both;10 reps;Supine Toe Raises: Both;20 reps;Supine Heel Raises: Both;20 reps;Supine Other Exercises Other Exercises: bridge 10x    General Comments        Pertinent Vitals/Pain Pain Score: 5  Pain Location: left side of abdomen Pain Descriptors / Indicators: Aching;Sore Pain Intervention(s): Limited activity within patient's tolerance;Monitored during session    Home Living                      Prior Function             PT Goals (current goals can now be found in the care plan section)      Frequency    Min 3X/week      PT Plan      Co-evaluation              AM-PAC PT "6 Clicks" Mobility   Outcome Measure  Help needed turning from your back to your side while in a flat bed without using bedrails?: None Help needed moving from lying on your back to sitting on the side of a flat bed without using bedrails?: None Help needed moving to and from a bed to a chair (including a wheelchair)?: A Little Help needed standing up from a chair using your arms (e.g., wheelchair or bedside chair)?: None Help needed to walk in hospital room?: A Little Help needed climbing 3-5 steps with a railing? : A Little 6 Click Score: 21    End of Session Equipment Utilized During Treatment: Oxygen Activity Tolerance: Patient tolerated treatment well;Patient limited by fatigue Patient left: in bed;with call bell/phone within reach;with bed alarm set Nurse Communication: Mobility status PT Visit Diagnosis: Unsteadiness on feet (R26.81);Other abnormalities of gait and mobility (R26.89);Muscle weakness (generalized) (M62.81)     Time: 1610-9604 PT Time Calculation (min) (ACUTE ONLY): 20 min  Charges:  $Therapeutic Exercise: 8-22 mins                     [address] LPTA; [facility] [phone]  Aldona Lento 10/24/2019, 2:08 PM

## 2019-10-24 NOTE — Progress Notes (Signed)
PT Cancellation Note  Patient Details Name: Blake Wise MRN: [id number] DOB: [birth year]   Cancelled Treatment:    Reason Eval/Treat Not Completed: Fatigue/lethargy limiting ability to participate Attempted PT session.  Pt limited by fatigue and c/o SOB following cleansing earlier today.  Reports he is no longer spitting up blood.  Will attempt PT session later today if available.    [address], LPTA; [place]  Aldona Lento 10/24/2019, 11:55 AM

## 2019-10-24 NOTE — Progress Notes (Signed)
PROGRESS NOTE    Blake Wise  GHW:[id number] DOB: 06-20-1946 DOA: 10/18/2019 PCP: Neale Burly, MD   Brief Narrative:  Per HPI: [name] a 74 y.o.malewith medical history significant ofatrial fibrillation, congestive heart failure, COPD, hypertension, hyperlipidemia, diabetes mellitus type 2 who presented to the ER with worsening shortness of breath, abdominal pain. Patient is states he has been having left upper quadrant abdominal pain for the last 1 week. He is also had multiple episodes of vomiting. Patient reports progressively worsening shortness of breath, described as dyspnea on exertion.He is also had a cough with bloody sputum over the last 2 days. Today he had palpitations which persisted through the day. No chest pain reported. No fever, chills. No hematemesis, melena, hematochezia, dysuria, diarrhea, syncope, seizures, dizziness, lightheadedness, focal deficits, blurring of vision.  Assessment & Plan:   Principal Problem:   Atrial fibrillation with RVR (HCC) Active Problems:   Chronic diastolic CHF (congestive heart failure) (HCC)   Chest pain   DM2 (diabetes mellitus, type 2) (HCC)   HTN (hypertension)   Acute urinary retention   Elevated INR   Community acquired pneumonia   Enteritis   Acute blood loss anemia   Sepsis-multifactorial with enteritis as well as right-sided pneumonia with concern for aspiration-improving -He was treated with Zosyn and azithromycin  -Transition to oral antibiotics with a total antibiotic course of 7 days -Blood culture with no growth x3 days -No further diarrhea, vomiting, abdominal pain  Atrial fibrillation with RVR secondary to above-resolved -IV Cardizem drip discontinued and patient transition to oral Cardizem 30 mg every 12 hours  -Continue on home dose of metoprolol -2D echo with LVEF 50-55% -Patient had supratherapeutic INR which has now resolved -Coumadin currently on hold in light of hemoptysis  Acute  anemia-suspect blood loss -Patient with 2 dark bowel movements prior to admission and had a significant drop in hemoglobin levels with soft blood pressure readings -Unclear if this is truly related to blood loss versus hemodilution -Seen by GI with plans for outpatient endoscopy unless he has gross bleeding or transfusion dependent anemia -Hemoglobin is currently stable -Received a dose of IV iron -Coumadin currently on hold in the setting of anemia/bleeding  Hemoptysis -Patient was having frank blood/hemoptysis with cough.  This had progressively been getting worse -We will hold further anticoagulation -CT chest shows pneumonia versus pulmonary hemorrhage -Discussed with Dr. Halford Chessman, recommended continuing to treat pneumonia and reverse INR.  If hemoptysis persists, may need further investigation  Mild acute on chronic hypoxemic respiratory failure -Currently on 3 L and baseline 2 L at home -Wean as tolerated  Supratherapeutic INR-resolved -Appreciate pharmacy management -Given vitamin K on 1/3, 1/7  Troponin elevation likely secondary to demand ischemia -No chest pain or findings of ACS on EKG  Chronic diastolic congestive heart failure -No volume overload currently noted -Follow daily weights -Repeat 2D echocardiogram with noted LVEF 50-55% -Lasix on hold due to sepsis physiology, will need to be reinitiated once this resolves  Hypertension-stable -Continue metoprolol as well as Cardizem and monitor carefully  Type 2 diabetes -A1c 5.8 -Hold Januvia for now -Blood sugars stable -Continue on sliding scale insulin  Chronic COPD on home 2 L nasal cannula oxygen -Patient is still short of breath has rhonchi and wheezing -He has difficulty carrying on a conversation -Continue bronchodilators -Continue intravenous steroids -Add Mucomyst nebs.   DVT prophylaxis: Coumadin with INR 2.5 Code Status: Full Family Communication: None at bedside Disposition Plan:   Continue careful monitoring and transfuse as  needed.  Continue current antibiotics.  Monitor heart rate and blood pressure on current medications.  Further evaluation for hemoptysis.   Consultants:   GI  Procedures:   None  Antimicrobials:  Anti-infectives (From admission, onward)   Start     Dose/Rate Route Frequency Ordered Stop   10/24/19 1200  amoxicillin-clavulanate (AUGMENTIN) 875-125 MG per tablet 1 tablet     1 tablet Oral Every 12 hours 10/24/19 0940 10/26/19 0959   10/20/19 0200  azithromycin (ZITHROMAX) 500 mg in sodium chloride 0.9 % 250 mL IVPB  Status:  Discontinued     500 mg 250 mL/hr over 60 Minutes Intravenous Every 24 hours 10/19/19 0622 10/24/19 0937   10/19/19 1400  piperacillin-tazobactam (ZOSYN) IVPB 3.375 g  Status:  Discontinued     3.375 g 12.5 mL/hr over 240 Minutes Intravenous Every 8 hours 10/19/19 0635 10/24/19 0940   10/19/19 0700  piperacillin-tazobactam (ZOSYN) IVPB 3.375 g     3.375 g 100 mL/hr over 30 Minutes Intravenous  Once 10/19/19 0635 10/19/19 0810   10/19/19 0315  metroNIDAZOLE (FLAGYL) IVPB 500 mg     500 mg 100 mL/hr over 60 Minutes Intravenous  Once 10/19/19 0314 10/19/19 0431   10/19/19 0100  cefTRIAXone (ROCEPHIN) 1 g in sodium chloride 0.9 % 100 mL IVPB     1 g 200 mL/hr over 30 Minutes Intravenous  Once 10/19/19 0053 10/19/19 0215   10/19/19 0100  azithromycin (ZITHROMAX) 500 mg in sodium chloride 0.9 % 250 mL IVPB     500 mg 250 mL/hr over 60 Minutes Intravenous  Once 10/19/19 0053 10/19/19 0227      Subjective: Continues to have cough with some hemoptysis, although this has improved.  Continues to have some wheezing.  Becomes short of breath in conversation.  Objective: Vitals:   10/24/19 0846 10/24/19 1324 10/24/19 1405 10/24/19 1907  BP:   (!) 118/91   Pulse:   82   Resp:   19   Temp:   98.3 F (36.8 C)   TempSrc:   Oral   SpO2: 97% 99% 96% 97%  Weight:      Height:        Intake/Output Summary (Last 24  hours) at 10/24/2019 1918 Last data filed at 10/24/2019 1900 Gross per 24 hour  Intake 1234.35 ml  Output 1200 ml  Net 34.35 ml   Filed Weights   10/21/19 0500 10/22/19 0500 10/24/19 0500  Weight: 54.1 kg 54.6 kg 61 kg    Examination:  General exam: Alert, awake, oriented x 3 Respiratory system: Mild wheezing bilaterally.  Increased respiratory effort l. Cardiovascular system:RRR. No murmurs, rubs, gallops. Gastrointestinal system: Abdomen is nondistended, soft and nontender. No organomegaly or masses felt. Normal bowel sounds heard. Central nervous system: Alert and oriented. No focal neurological deficits. Extremities: No C/C/E, +pedal pulses Skin: No rashes, lesions or ulcers Psychiatry: Judgement and insight appear normal. Mood & affect appropriate.     Data Reviewed: I have personally reviewed following labs and imaging studies  CBC: Recent Labs  Lab 10/18/19 2318 10/20/19 0450 10/20/19 1028 10/21/19 0343 10/22/19 0448 10/23/19 0513 10/24/19 0625  WBC 15.3* 12.5*  --  12.4* 12.2* 14.5* 16.9*  NEUTROABS 13.3*  --   --   --   --   --   --   HGB 11.0* 7.2* 7.2* 7.4* 7.7* 8.5* 8.3*  HCT 34.3* 22.4* 23.2* 24.2* 25.0* 27.0* 27.1*  MCV 96.9 100.0  --  100.4* 100.4* 101.5* 103.0*  PLT 349  236  --  246 278 302 709   Basic Metabolic Panel: Recent Labs  Lab 10/18/19 2355 10/20/19 0450 10/21/19 0343 10/22/19 0448 10/23/19 0513 10/24/19 0625  NA 147* 144 143 143 144 141  K 4.1 3.3* 4.1 3.7 4.5 4.4  CL 103 104 106 106 108 107  CO2 29 29 25 25 26 25   GLUCOSE 156* 61* 113* 120* 187* 128*  BUN 35* 33* 27* 22 23 28*  CREATININE 1.61* 1.59* 1.73* 1.57* 1.54* 1.62*  CALCIUM 8.6* 7.8* 7.9* 8.3* 8.2* 8.3*  MG 2.0 2.2 2.2  --   --   --   PHOS  --   --   --   --  2.2*  --    GFR: Estimated Creatinine Clearance: 34.5 mL/min (A) (by C-G formula based on SCr of 1.62 mg/dL (H)). Liver Function Tests: Recent Labs  Lab 10/18/19 2355 10/20/19 0450 10/23/19 0513  AST 55* 40   --   ALT 43 32  --   ALKPHOS 76 47  --   BILITOT 2.5* 1.7*  --   PROT 7.2 5.3*  --   ALBUMIN 3.5 2.6* 2.7*   Recent Labs  Lab 10/18/19 2355  LIPASE 14   No results for input(s): AMMONIA in the last 168 hours. Coagulation Profile: Recent Labs  Lab 10/18/19 2319 10/20/19 0450 10/22/19 0448 10/23/19 0513 10/24/19 0625  INR 10.2* 1.8* 2.5* 3.4* 2.4*   Cardiac Enzymes: No results for input(s): CKTOTAL, CKMB, CKMBINDEX, TROPONINI in the last 168 hours. BNP (last 3 results) No results for input(s): PROBNP in the last 8760 hours. HbA1C: No results for input(s): HGBA1C in the last 72 hours. CBG: Recent Labs  Lab 10/23/19 1619 10/23/19 2208 10/24/19 0753 10/24/19 1112 10/24/19 1647  GLUCAP 132* 161* 123* 126* 141*   Lipid Profile: No results for input(s): CHOL, HDL, LDLCALC, TRIG, CHOLHDL, LDLDIRECT in the last 72 hours. Thyroid Function Tests: No results for input(s): TSH, T4TOTAL, FREET4, T3FREE, THYROIDAB in the last 72 hours. Anemia Panel: No results for input(s): VITAMINB12, FOLATE, FERRITIN, TIBC, IRON, RETICCTPCT in the last 72 hours. Sepsis Labs: Recent Labs  Lab 10/19/19 0519 10/19/19 1049 10/19/19 1529 10/20/19 0450  LATICACIDVEN 1.2 1.4 1.5 1.2    Recent Results (from the past 240 hour(s))  Respiratory Panel by RT PCR (Flu A&B, Covid) - Nasopharyngeal Swab     Status: None   Collection Time: 10/18/19 11:18 PM   Specimen: Nasopharyngeal Swab  Result Value Ref Range Status   SARS Coronavirus 2 by RT PCR NEGATIVE NEGATIVE Final    Comment: (NOTE) SARS-CoV-2 target nucleic acids are NOT DETECTED. The SARS-CoV-2 RNA is generally detectable in upper respiratoy specimens during the acute phase of infection. The lowest concentration of SARS-CoV-2 viral copies this assay can detect is 131 copies/mL. A negative result does not preclude SARS-Cov-2 infection and should not be used as the sole basis for treatment or other patient management decisions. A  negative result may occur with  improper specimen collection/handling, submission of specimen other than nasopharyngeal swab, presence of viral mutation(s) within the areas targeted by this assay, and inadequate number of viral copies (<131 copies/mL). A negative result must be combined with clinical observations, patient history, and epidemiological information. The expected result is Negative. Fact Sheet for Patients:  PinkCheek.be Fact Sheet for Healthcare Providers:  GravelBags.it This test is not yet ap proved or cleared by the [place] FDA and  has been authorized for detection and/or diagnosis of SARS-CoV-2 by FDA  under an Emergency Use Authorization (EUA). This EUA will remain  in effect (meaning this test can be used) for the duration of the COVID-19 declaration under Section 564(b)(1) of the Act, 21 U.S.C. section 360bbb-3(b)(1), unless the authorization is terminated or revoked sooner.    Influenza A by PCR NEGATIVE NEGATIVE Final   Influenza B by PCR NEGATIVE NEGATIVE Final    Comment: (NOTE) The Xpert Xpress SARS-CoV-2/FLU/RSV assay is intended as an aid in  the diagnosis of influenza from Nasopharyngeal swab specimens and  should not be used as a sole basis for treatment. Nasal washings and  aspirates are unacceptable for Xpert Xpress SARS-CoV-2/FLU/RSV  testing. Fact Sheet for Patients: PinkCheek.be Fact Sheet for Healthcare Providers: GravelBags.it This test is not yet approved or cleared by the [place] FDA and  has been authorized for detection and/or diagnosis of SARS-CoV-2 by  FDA under an Emergency Use Authorization (EUA). This EUA will remain  in effect (meaning this test can be used) for the duration of the  Covid-19 declaration under Section 564(b)(1) of the Act, 21  U.S.C. section 360bbb-3(b)(1), unless the authorization is    terminated or revoked. Performed at [facility], [address], [place], [place] [address]   Blood culture (routine x 2)     Status: None   Collection Time: 10/19/19  3:12 AM   Specimen: BLOOD LEFT ARM  Result Value Ref Range Status   Specimen Description BLOOD LEFT ARM  Final   Special Requests   Final    BOTTLES DRAWN AEROBIC ONLY Blood Culture adequate volume   Culture   Final    NO GROWTH 5 DAYS Performed at [facility], [address] [place], [place] [address]    Report Status 10/24/2019 FINAL  Final  Blood culture (routine x 2)     Status: None   Collection Time: 10/19/19  5:19 AM   Specimen: Left Antecubital; Blood  Result Value Ref Range Status   Specimen Description LEFT ANTECUBITAL  Final   Special Requests   Final    BOTTLES DRAWN AEROBIC AND ANAEROBIC Blood Culture adequate volume   Culture   Final    NO GROWTH 5 DAYS Performed at [facility], [address], [place], [place] [address]    Report Status 10/24/2019 FINAL  Final  MRSA PCR Screening     Status: None   Collection Time: 10/19/19  6:17 AM   Specimen: Nasopharyngeal  Result Value Ref Range Status   MRSA by PCR NEGATIVE NEGATIVE Final    Comment:        The GeneXpert MRSA Assay (FDA approved for NASAL specimens only), is one component of a comprehensive MRSA colonization surveillance program. It is not intended to diagnose MRSA infection nor to guide or monitor treatment for MRSA infections. Performed at [facility], [address], [place], [place] [address]   Expectorated sputum assessment w rflx to resp cult     Status: None   Collection Time: 10/24/19  1:37 PM   Specimen: Expectorated Sputum  Result Value Ref Range Status   Specimen Description EXPECTORATED SPUTUM  Final   Special Requests NONE  Final   Sputum evaluation   Final    THIS SPECIMEN IS ACCEPTABLE FOR SPUTUM CULTURE Performed at [facility], [address] [place], [place] [address]    Report Status 10/24/2019 FINAL   Final         Radiology Studies: No results found.      Scheduled Meds: . amoxicillin-clavulanate  1 tablet Oral Q12H  . atorvastatin  10 mg Oral Daily  . Chlorhexidine Gluconate Cloth  6 each Topical Daily  . diltiazem  30 mg Oral Q12H  . feeding supplement  1 Container Oral TID WC & HS  . guaiFENesin  1,200 mg Oral BID  . insulin aspart  0-6 Units Subcutaneous TID WC  . ipratropium-albuterol  3 mL Nebulization Q6H  . mouth rinse  15 mL Mouth Rinse BID  . methocarbamol  500 mg Oral QID  . methylPREDNISolone (SOLU-MEDROL) injection  60 mg Intravenous Q12H  . metoprolol tartrate  50 mg Oral BID  . pantoprazole  40 mg Oral BID AC  . sodium bicarbonate  650 mg Oral QID  . sodium chloride flush  3 mL Intravenous Q12H  . Warfarin - Pharmacist Dosing Inpatient   Does not apply q1800   Continuous Infusions: . sodium chloride       LOS: 5 days    Time spent: 30 minutes    Kathie Dike, MD [facility]  If 7PM-7AM, please contact night-coverage [email or web address]  10/24/2019, 7:18 PM

## 2019-10-24 NOTE — Care Management Important Message (Addendum)
Important Message  Patient Details  Name: Blake Wise MRN: [id number] Date of Birth: 03/31/1946   Medicare Important Message Given:  Rogue Jury, RN will deliver letter to patient   [name] 10/24/2019, 3:08 PM

## 2019-10-25 LAB — GLUCOSE, CAPILLARY
Glucose-Capillary: 145 mg/dL — ABNORMAL HIGH (ref 70–99)
Glucose-Capillary: 192 mg/dL — ABNORMAL HIGH (ref 70–99)
Glucose-Capillary: 173 mg/dL — ABNORMAL HIGH (ref 70–99)
Glucose-Capillary: 225 mg/dL — ABNORMAL HIGH (ref 70–99)

## 2019-10-25 LAB — CBC
HCT: 25.4 % — ABNORMAL LOW (ref 39.0–52.0)
Hemoglobin: 8 g/dL — ABNORMAL LOW (ref 13.0–17.0)
MCH: 32.4 pg (ref 26.0–34.0)
MCHC: 31.5 g/dL (ref 30.0–36.0)
MCV: 102.8 fL — ABNORMAL HIGH (ref 80.0–100.0)
Platelets: 349 10*3/uL (ref 150–400)
RBC: 2.47 MIL/uL — ABNORMAL LOW (ref 4.22–5.81)
RDW: 20.6 % — ABNORMAL HIGH (ref 11.5–15.5)
WBC: 12.4 10*3/uL — ABNORMAL HIGH (ref 4.0–10.5)
nRBC: 11.3 % — ABNORMAL HIGH (ref 0.0–0.2)

## 2019-10-25 LAB — BASIC METABOLIC PANEL
Anion gap: 9 (ref 5–15)
BUN: 36 mg/dL — ABNORMAL HIGH (ref 8–23)
CO2: 24 mmol/L (ref 22–32)
Calcium: 8.1 mg/dL — ABNORMAL LOW (ref 8.9–10.3)
Chloride: 108 mmol/L (ref 98–111)
Creatinine, Ser: 1.61 mg/dL — ABNORMAL HIGH (ref 0.61–1.24)
GFR calc Af Amer: 48 mL/min — ABNORMAL LOW (ref >60)
GFR calc non Af Amer: 42 mL/min — ABNORMAL LOW (ref >60)
Glucose, Bld: 128 mg/dL — ABNORMAL HIGH (ref 70–99)
Potassium: 4 mmol/L (ref 3.5–5.1)
Sodium: 141 mmol/L (ref 135–145)

## 2019-10-25 LAB — PROTIME-INR
INR: 1.6 — ABNORMAL HIGH (ref 0.8–1.2)
Prothrombin Time: 18.6 seconds — ABNORMAL HIGH (ref 11.4–15.2)

## 2019-10-25 MED ORDER — FUROSEMIDE 20 MG PO TABS
20.0000 mg | ORAL_TABLET | Freq: Every day | ORAL | Status: DC
  Administered 2019-10-25 – 2019-10-28 (×4): 20 mg via ORAL
  Filled 2019-10-25 (×4): qty 1

## 2019-10-25 MED ORDER — BUDESONIDE 0.25 MG/2ML IN SUSP
0.2500 mg | Freq: Two times a day (BID) | RESPIRATORY_TRACT | Status: DC
  Administered 2019-10-25 – 2019-10-28 (×6): 0.25 mg via RESPIRATORY_TRACT
  Filled 2019-10-25 (×6): qty 2

## 2019-10-25 NOTE — Plan of Care (Signed)
Respiratory complications decreased on this shift

## 2019-10-25 NOTE — Progress Notes (Signed)
PROGRESS NOTE    Blake Wise  NTI:[id number] DOB: 1946/02/09 DOA: 10/18/2019 PCP: Neale Burly, MD   Brief Narrative:  Per HPI: Blake Wise a 74 y.o.malewith medical history significant ofatrial fibrillation, congestive heart failure, COPD, hypertension, hyperlipidemia, diabetes mellitus type 2 who presented to the ER with worsening shortness of breath, abdominal pain. Patient is states he has been having left upper quadrant abdominal pain for the last 1 week. He is also had multiple episodes of vomiting. Patient reports progressively worsening shortness of breath, described as dyspnea on exertion.He is also had a cough with bloody sputum over the last 2 days. Today he had palpitations which persisted through the day. No chest pain reported. No fever, chills. No hematemesis, melena, hematochezia, dysuria, diarrhea, syncope, seizures, dizziness, lightheadedness, focal deficits, blurring of vision.  Assessment & Plan:   Principal Problem:   Atrial fibrillation with RVR (HCC) Active Problems:   Chronic diastolic CHF (congestive heart failure) (HCC)   Chest pain   DM2 (diabetes mellitus, type 2) (HCC)   HTN (hypertension)   Acute urinary retention   Elevated INR   Community acquired pneumonia   Enteritis   Acute blood loss anemia   Sepsis-multifactorial with enteritis as well as right-sided pneumonia with concern for aspiration-improving -He was treated with Zosyn and azithromycin  -Transition to oral antibiotics with a total antibiotic course of 7 days -Blood culture with no growth x3 days -No further diarrhea, vomiting, abdominal pain  Atrial fibrillation with RVR secondary to above-resolved -IV Cardizem drip discontinued and patient transition to oral Cardizem 30 mg every 12 hours  -Continue on home dose of metoprolol -2D echo with LVEF 50-55% -Patient had supratherapeutic INR which has now resolved -Coumadin currently on hold in light of hemoptysis  Acute  anemia-suspect blood loss -Patient with 2 dark bowel movements prior to admission and had a significant drop in hemoglobin levels with soft blood pressure readings -Unclear if this is truly related to blood loss versus hemodilution -Seen by GI with plans for outpatient endoscopy unless he has gross bleeding or transfusion dependent anemia -Hemoglobin is currently stable -Received a dose of IV iron -Coumadin currently on hold in the setting of anemia/bleeding  Hemoptysis -Patient was having frank blood/hemoptysis with cough.  This had progressively been getting worse -We will hold further anticoagulation -CT chest shows pneumonia versus pulmonary hemorrhage -Discussed with Dr. Halford Chessman, recommended continuing to treat pneumonia and reverse INR.  If hemoptysis persists, may need further investigation  Mild acute on chronic hypoxemic respiratory failure -Currently on 3 L and baseline 2 L at home -Wean as tolerated  Supratherapeutic INR-resolved -Appreciate pharmacy management -Given vitamin K on 1/3, 1/7  Troponin elevation likely secondary to demand ischemia -No chest pain or findings of ACS on EKG  Chronic diastolic congestive heart failure -No volume overload currently noted -Follow daily weights -Repeat 2D echocardiogram with noted LVEF 50-55% -Lasix on hold due to sepsis physiology, will need to be reinitiated once this resolves  Hypertension-stable -Continue metoprolol as well as Cardizem and monitor carefully  Type 2 diabetes -A1c 5.8 -Hold Januvia for now -Blood sugars stable -Continue on sliding scale insulin  Chronic COPD on home 2 L nasal cannula oxygen -Patient is still short of breath in conversation and does not feel back to baseline -Continue bronchodilators -Continue intravenous steroids -Continue Mucomyst nebs.   DVT prophylaxis: Coumadin with INR 2.5 Code Status: Full Family Communication: None at bedside Disposition Plan:  Continue careful  monitoring and transfuse as needed.  Continue current antibiotics.  Monitor heart rate and blood pressure on current medications.  Further evaluation for hemoptysis.   Consultants:   GI  Procedures:   None  Antimicrobials:  Anti-infectives (From admission, onward)   Start     Dose/Rate Route Frequency Ordered Stop   10/24/19 1200  amoxicillin-clavulanate (AUGMENTIN) 875-125 MG per tablet 1 tablet     1 tablet Oral Every 12 hours 10/24/19 0940 10/26/19 0959   10/20/19 0200  azithromycin (ZITHROMAX) 500 mg in sodium chloride 0.9 % 250 mL IVPB  Status:  Discontinued     500 mg 250 mL/hr over 60 Minutes Intravenous Every 24 hours 10/19/19 0622 10/24/19 0937   10/19/19 1400  piperacillin-tazobactam (ZOSYN) IVPB 3.375 g  Status:  Discontinued     3.375 g 12.5 mL/hr over 240 Minutes Intravenous Every 8 hours 10/19/19 0635 10/24/19 0940   10/19/19 0700  piperacillin-tazobactam (ZOSYN) IVPB 3.375 g     3.375 g 100 mL/hr over 30 Minutes Intravenous  Once 10/19/19 0635 10/19/19 0810   10/19/19 0315  metroNIDAZOLE (FLAGYL) IVPB 500 mg     500 mg 100 mL/hr over 60 Minutes Intravenous  Once 10/19/19 0314 10/19/19 0431   10/19/19 0100  cefTRIAXone (ROCEPHIN) 1 g in sodium chloride 0.9 % 100 mL IVPB     1 g 200 mL/hr over 30 Minutes Intravenous  Once 10/19/19 0053 10/19/19 0215   10/19/19 0100  azithromycin (ZITHROMAX) 500 mg in sodium chloride 0.9 % 250 mL IVPB     500 mg 250 mL/hr over 60 Minutes Intravenous  Once 10/19/19 0053 10/19/19 0227      Subjective: Patient continues to cough, expectorated clots, no fresh blood.  Still feels short of breath and has difficulty carrying on a conversation.  Objective: Vitals:   10/25/19 0736 10/25/19 0858 10/25/19 1336 10/25/19 1500  BP:  (!) 165/109  114/80  Pulse:  (!) 109  80  Resp:    19  Temp:    98.4 F (36.9 C)  TempSrc:    Oral  SpO2: 92%  95% 99%  Weight:      Height:        Intake/Output Summary (Last 24 hours) at 10/25/2019  1915 Last data filed at 10/25/2019 1700 Gross per 24 hour  Intake 600 ml  Output 1050 ml  Net -450 ml   Filed Weights   10/22/19 0500 10/24/19 0500 10/25/19 0542  Weight: 54.6 kg 61 kg 60.7 kg    Examination:  General exam: Alert, awake, oriented x 3 Respiratory system: diminished breath sounds, wheezing is better. Increased respiratory effort Cardiovascular system:RRR. No murmurs, rubs, gallops. Gastrointestinal system: Abdomen is nondistended, soft and nontender. No organomegaly or masses felt. Normal bowel sounds heard. Central nervous system: Alert and oriented. No focal neurological deficits. Extremities: No C/C/E, +pedal pulses Skin: No rashes, lesions or ulcers Psychiatry: Judgement and insight appear normal. Mood & affect appropriate.      Data Reviewed: I have personally reviewed following labs and imaging studies  CBC: Recent Labs  Lab 10/18/19 2318 10/21/19 0343 10/22/19 0448 10/23/19 0513 10/24/19 0625 10/25/19 0623  WBC 15.3* 12.4* 12.2* 14.5* 16.9* 12.4*  NEUTROABS 13.3*  --   --   --   --   --   HGB 11.0* 7.4* 7.7* 8.5* 8.3* 8.0*  HCT 34.3* 24.2* 25.0* 27.0* 27.1* 25.4*  MCV 96.9 100.4* 100.4* 101.5* 103.0* 102.8*  PLT 349 246 278 302 352 784   Basic Metabolic Panel: Recent Labs  Lab  10/18/19 2355 10/20/19 0450 10/21/19 0343 10/22/19 0448 10/23/19 0513 10/24/19 0625 10/25/19 0623  NA 147* 144 143 143 144 141 141  K 4.1 3.3* 4.1 3.7 4.5 4.4 4.0  CL 103 104 106 106 108 107 108  CO2 29 29 25 25 26 25 24   GLUCOSE 156* 61* 113* 120* 187* 128* 128*  BUN 35* 33* 27* 22 23 28* 36*  CREATININE 1.61* 1.59* 1.73* 1.57* 1.54* 1.62* 1.61*  CALCIUM 8.6* 7.8* 7.9* 8.3* 8.2* 8.3* 8.1*  MG 2.0 2.2 2.2  --   --   --   --   PHOS  --   --   --   --  2.2*  --   --    GFR: Estimated Creatinine Clearance: 34.6 mL/min (A) (by C-G formula based on SCr of 1.61 mg/dL (H)). Liver Function Tests: Recent Labs  Lab 10/18/19 2355 10/20/19 0450 10/23/19 0513  AST  55* 40  --   ALT 43 32  --   ALKPHOS 76 47  --   BILITOT 2.5* 1.7*  --   PROT 7.2 5.3*  --   ALBUMIN 3.5 2.6* 2.7*   Recent Labs  Lab 10/18/19 2355  LIPASE 14   No results for input(s): AMMONIA in the last 168 hours. Coagulation Profile: Recent Labs  Lab 10/20/19 0450 10/22/19 0448 10/23/19 0513 10/24/19 0625 10/25/19 0623  INR 1.8* 2.5* 3.4* 2.4* 1.6*   Cardiac Enzymes: No results for input(s): CKTOTAL, CKMB, CKMBINDEX, TROPONINI in the last 168 hours. BNP (last 3 results) No results for input(s): PROBNP in the last 8760 hours. HbA1C: No results for input(s): HGBA1C in the last 72 hours. CBG: Recent Labs  Lab 10/24/19 1647 10/24/19 2017 10/25/19 0802 10/25/19 1137 10/25/19 1643  GLUCAP 141* 140* 145* 192* 173*   Lipid Profile: No results for input(s): CHOL, HDL, LDLCALC, TRIG, CHOLHDL, LDLDIRECT in the last 72 hours. Thyroid Function Tests: No results for input(s): TSH, T4TOTAL, FREET4, T3FREE, THYROIDAB in the last 72 hours. Anemia Panel: No results for input(s): VITAMINB12, FOLATE, FERRITIN, TIBC, IRON, RETICCTPCT in the last 72 hours. Sepsis Labs: Recent Labs  Lab 10/19/19 0519 10/19/19 1049 10/19/19 1529 10/20/19 0450  LATICACIDVEN 1.2 1.4 1.5 1.2    Recent Results (from the past 240 hour(s))  Respiratory Panel by RT PCR (Flu A&B, Covid) - Nasopharyngeal Swab     Status: None   Collection Time: 10/18/19 11:18 PM   Specimen: Nasopharyngeal Swab  Result Value Ref Range Status   SARS Coronavirus 2 by RT PCR NEGATIVE NEGATIVE Final    Comment: (NOTE) SARS-CoV-2 target nucleic acids are NOT DETECTED. The SARS-CoV-2 RNA is generally detectable in upper respiratoy specimens during the acute phase of infection. The lowest concentration of SARS-CoV-2 viral copies this assay can detect is 131 copies/mL. A negative result does not preclude SARS-Cov-2 infection and should not be used as the sole basis for treatment or other patient management decisions. A  negative result may occur with  improper specimen collection/handling, submission of specimen other than nasopharyngeal swab, presence of viral mutation(s) within the areas targeted by this assay, and inadequate number of viral copies (<131 copies/mL). A negative result must be combined with clinical observations, patient history, and epidemiological information. The expected result is Negative. Fact Sheet for Patients:  PinkCheek.be Fact Sheet for Healthcare Providers:  GravelBags.it This test is not yet ap proved or cleared by the [place] FDA and  has been authorized for detection and/or diagnosis of SARS-CoV-2 by FDA under  an Emergency Use Authorization (EUA). This EUA will remain  in effect (meaning this test can be used) for the duration of the COVID-19 declaration under Section 564(b)(1) of the Act, 21 U.S.C. section 360bbb-3(b)(1), unless the authorization is terminated or revoked sooner.    Influenza A by PCR NEGATIVE NEGATIVE Final   Influenza B by PCR NEGATIVE NEGATIVE Final    Comment: (NOTE) The Xpert Xpress SARS-CoV-2/FLU/RSV assay is intended as an aid in  the diagnosis of influenza from Nasopharyngeal swab specimens and  should not be used as a sole basis for treatment. Nasal washings and  aspirates are unacceptable for Xpert Xpress SARS-CoV-2/FLU/RSV  testing. Fact Sheet for Patients: PinkCheek.be Fact Sheet for Healthcare Providers: GravelBags.it This test is not yet approved or cleared by the [place] FDA and  has been authorized for detection and/or diagnosis of SARS-CoV-2 by  FDA under an Emergency Use Authorization (EUA). This EUA will remain  in effect (meaning this test can be used) for the duration of the  Covid-19 declaration under Section 564(b)(1) of the Act, 21  U.S.C. section 360bbb-3(b)(1), unless the authorization is    terminated or revoked. Performed at [facility], [address], [place], [place] [address]   Blood culture (routine x 2)     Status: None   Collection Time: 10/19/19  3:12 AM   Specimen: BLOOD LEFT ARM  Result Value Ref Range Status   Specimen Description BLOOD LEFT ARM  Final   Special Requests   Final    BOTTLES DRAWN AEROBIC ONLY Blood Culture adequate volume   Culture   Final    NO GROWTH 5 DAYS Performed at [facility], [address] [place], [place] [address]    Report Status 10/24/2019 FINAL  Final  Blood culture (routine x 2)     Status: None   Collection Time: 10/19/19  5:19 AM   Specimen: Left Antecubital; Blood  Result Value Ref Range Status   Specimen Description LEFT ANTECUBITAL  Final   Special Requests   Final    BOTTLES DRAWN AEROBIC AND ANAEROBIC Blood Culture adequate volume   Culture   Final    NO GROWTH 5 DAYS Performed at [facility], [address], [place], [place] [address]    Report Status 10/24/2019 FINAL  Final  MRSA PCR Screening     Status: None   Collection Time: 10/19/19  6:17 AM   Specimen: Nasopharyngeal  Result Value Ref Range Status   MRSA by PCR NEGATIVE NEGATIVE Final    Comment:        The GeneXpert MRSA Assay (FDA approved for NASAL specimens only), is one component of a comprehensive MRSA colonization surveillance program. It is not intended to diagnose MRSA infection nor to guide or monitor treatment for MRSA infections. Performed at [facility], [address], [place], [place] [address]   Expectorated sputum assessment w rflx to resp cult     Status: None   Collection Time: 10/24/19  1:37 PM   Specimen: Expectorated Sputum  Result Value Ref Range Status   Specimen Description EXPECTORATED SPUTUM  Final   Special Requests NONE  Final   Sputum evaluation   Final    THIS SPECIMEN IS ACCEPTABLE FOR SPUTUM CULTURE Performed at [facility], [address], [place], [place] [address]    Report Status 10/24/2019 FINAL   Final  Culture, respiratory     Status: None (Preliminary result)   Collection Time: 10/24/19  1:37 PM  Result Value Ref  Range Status   Specimen Description   Final    EXPECTORATED SPUTUM Performed at [facility], [address] [place], [place] [address]    Special Requests   Final    NONE Reflexed from [id number] Performed at [facility], [address] [place], [place] [address]    Gram Stain   Final    ABUNDANT WBC PRESENT, PREDOMINANTLY PMN FEW SQUAMOUS EPITHELIAL CELLS PRESENT ABUNDANT YEAST    Culture   Final    MODERATE YEAST IDENTIFICATION TO FOLLOW Performed at [facility], [place] [address], [place], [place] [address]    Report Status PENDING  Incomplete         Radiology Studies: No results found.      Scheduled Meds: . acetylcysteine  4 mL Nebulization TID  . amoxicillin-clavulanate  1 tablet Oral Q12H  . atorvastatin  10 mg Oral Daily  . Chlorhexidine Gluconate Cloth  6 each Topical Daily  . diltiazem  30 mg Oral Q12H  . feeding supplement  1 Container Oral TID WC & HS  . guaiFENesin  1,200 mg Oral BID  . insulin aspart  0-6 Units Subcutaneous TID WC  . ipratropium-albuterol  3 mL Nebulization Q6H  . mouth rinse  15 mL Mouth Rinse BID  . methocarbamol  500 mg Oral QID  . methylPREDNISolone (SOLU-MEDROL) injection  60 mg Intravenous Q12H  . metoprolol tartrate  50 mg Oral BID  . pantoprazole  40 mg Oral BID AC  . sodium bicarbonate  650 mg Oral QID  . sodium chloride flush  3 mL Intravenous Q12H   Continuous Infusions: . sodium chloride       LOS: 6 days    Time spent: 30 minutes    Kathie Dike, MD [facility]  If 7PM-7AM, please contact night-coverage [email or web address]  10/25/2019, 7:15 PM

## 2019-10-26 LAB — CULTURE, RESPIRATORY W GRAM STAIN

## 2019-10-26 LAB — BASIC METABOLIC PANEL
Anion gap: 10 (ref 5–15)
BUN: 32 mg/dL — ABNORMAL HIGH (ref 8–23)
CO2: 25 mmol/L (ref 22–32)
Calcium: 8 mg/dL — ABNORMAL LOW (ref 8.9–10.3)
Chloride: 106 mmol/L (ref 98–111)
Creatinine, Ser: 1.47 mg/dL — ABNORMAL HIGH (ref 0.61–1.24)
GFR calc Af Amer: 54 mL/min — ABNORMAL LOW (ref >60)
GFR calc non Af Amer: 46 mL/min — ABNORMAL LOW (ref >60)
Glucose, Bld: 182 mg/dL — ABNORMAL HIGH (ref 70–99)
Potassium: 4.2 mmol/L (ref 3.5–5.1)
Sodium: 141 mmol/L (ref 135–145)

## 2019-10-26 LAB — CBC
HCT: 27.5 % — ABNORMAL LOW (ref 39.0–52.0)
Hemoglobin: 8.6 g/dL — ABNORMAL LOW (ref 13.0–17.0)
MCH: 33 pg (ref 26.0–34.0)
MCHC: 31.3 g/dL (ref 30.0–36.0)
MCV: 105.4 fL — ABNORMAL HIGH (ref 80.0–100.0)
Platelets: 387 10*3/uL (ref 150–400)
RBC: 2.61 MIL/uL — ABNORMAL LOW (ref 4.22–5.81)
RDW: 21.7 % — ABNORMAL HIGH (ref 11.5–15.5)
WBC: 14.7 10*3/uL — ABNORMAL HIGH (ref 4.0–10.5)
nRBC: 8.2 % — ABNORMAL HIGH (ref 0.0–0.2)

## 2019-10-26 LAB — GLUCOSE, CAPILLARY
Glucose-Capillary: 165 mg/dL — ABNORMAL HIGH (ref 70–99)
Glucose-Capillary: 177 mg/dL — ABNORMAL HIGH (ref 70–99)
Glucose-Capillary: 194 mg/dL — ABNORMAL HIGH (ref 70–99)
Glucose-Capillary: 190 mg/dL — ABNORMAL HIGH (ref 70–99)

## 2019-10-26 LAB — PROTIME-INR
INR: 1.4 — ABNORMAL HIGH (ref 0.8–1.2)
Prothrombin Time: 17.3 seconds — ABNORMAL HIGH (ref 11.4–15.2)

## 2019-10-26 MED ORDER — FUROSEMIDE 10 MG/ML IJ SOLN
20.0000 mg | Freq: Once | INTRAMUSCULAR | Status: AC
  Administered 2019-10-26: 20 mg via INTRAVENOUS
  Filled 2019-10-26: qty 2

## 2019-10-26 NOTE — Progress Notes (Signed)
PROGRESS NOTE    Blake Wise  ZOX:[id number] DOB: 28-Sep-1946 DOA: 10/18/2019 PCP: Neale Burly, MD   Brief Narrative:  Per HPI: Blake Wise a 74 y.o.malewith medical history significant ofatrial fibrillation, congestive heart failure, COPD, hypertension, hyperlipidemia, diabetes mellitus type 2 who presented to the ER with worsening shortness of breath, abdominal pain. Patient is states he has been having left upper quadrant abdominal pain for the last 1 week. He is also had multiple episodes of vomiting. Patient reports progressively worsening shortness of breath, described as dyspnea on exertion.He is also had a cough with bloody sputum over the last 2 days. Today he had palpitations which persisted through the day. No chest pain reported. No fever, chills. No hematemesis, melena, hematochezia, dysuria, diarrhea, syncope, seizures, dizziness, lightheadedness, focal deficits, blurring of vision.  Assessment & Plan:   Principal Problem:   Atrial fibrillation with RVR (HCC) Active Problems:   Chronic diastolic CHF (congestive heart failure) (HCC)   Chest pain   DM2 (diabetes mellitus, type 2) (HCC)   HTN (hypertension)   Acute urinary retention   Elevated INR   Community acquired pneumonia   Enteritis   Acute blood loss anemia   Sepsis-multifactorial with enteritis as well as right-sided pneumonia with concern for aspiration-improving -He was treated with Zosyn and azithromycin  -Is completed 7 days of antibiotics. -Blood culture with no growth x3 days -No further diarrhea, vomiting, abdominal pain  Atrial fibrillation with RVR secondary to above-resolved -IV Cardizem drip discontinued and patient transition to oral Cardizem 30 mg every 12 hours  -Continue on home dose of metoprolol -2D echo with LVEF 50-55% -Patient had supratherapeutic INR which has now resolved -Coumadin currently on hold in light of hemoptysis  Acute anemia-suspect blood loss -Patient  with 2 dark bowel movements prior to admission and had a significant drop in hemoglobin levels with soft blood pressure readings -Unclear if this is truly related to blood loss versus hemodilution -Seen by GI with plans for outpatient endoscopy unless he has gross bleeding or transfusion dependent anemia -Hemoglobin is currently stable -Received a dose of IV iron -Coumadin currently on hold in the setting of anemia/bleeding  Hemoptysis -Patient was having frank blood/hemoptysis with cough.  This had progressively been getting worse -We will hold further anticoagulation -CT chest shows pneumonia versus pulmonary hemorrhage -Discussed with Dr. Halford Chessman, recommended continuing to treat pneumonia and reverse INR.  If hemoptysis persists, may need further investigation  Mild acute on chronic hypoxemic respiratory failure -Currently on 3 L and baseline 2 L at home -Wean as tolerated  Supratherapeutic INR-resolved -Appreciate pharmacy management -Given vitamin K on 1/3, 1/7  Troponin elevation likely secondary to demand ischemia -No chest pain or findings of ACS on EKG  Chronic diastolic congestive heart failure -Patient does have some lower extremity edema. -Follow daily weights -Repeat 2D echocardiogram with noted LVEF 50-55% -Continue home dose of Lasix  Hypertension-stable -Continue metoprolol as well as Cardizem and monitor carefully  Type 2 diabetes -A1c 5.8 -Hold Januvia for now -Blood sugars stable -Continue on sliding scale insulin  Chronic COPD on home 2 L nasal cannula oxygen -Patient is still short of breath in conversation and does not feel back to baseline.  He normally ambulates without difficulty. -Continue bronchodilators -Continue intravenous/inhaled steroids -Continue Mucomyst nebs. -We will have staff ambulate patient. -Repeat chest x-ray in a.m.   DVT prophylaxis: Coumadin with INR 2.5 Code Status: Full Family Communication: None at  bedside Disposition Plan:  Continue careful monitoring and transfuse  as needed.  Continue current antibiotics.  Monitor heart rate and blood pressure on current medications.  Further evaluation for hemoptysis.   Consultants:   GI  Procedures:   None  Antimicrobials:  Anti-infectives (From admission, onward)   Start     Dose/Rate Route Frequency Ordered Stop   10/24/19 1200  amoxicillin-clavulanate (AUGMENTIN) 875-125 MG per tablet 1 tablet     1 tablet Oral Every 12 hours 10/24/19 0940 10/25/19 2104   10/20/19 0200  azithromycin (ZITHROMAX) 500 mg in sodium chloride 0.9 % 250 mL IVPB  Status:  Discontinued     500 mg 250 mL/hr over 60 Minutes Intravenous Every 24 hours 10/19/19 0622 10/24/19 0937   10/19/19 1400  piperacillin-tazobactam (ZOSYN) IVPB 3.375 g  Status:  Discontinued     3.375 g 12.5 mL/hr over 240 Minutes Intravenous Every 8 hours 10/19/19 0635 10/24/19 0940   10/19/19 0700  piperacillin-tazobactam (ZOSYN) IVPB 3.375 g     3.375 g 100 mL/hr over 30 Minutes Intravenous  Once 10/19/19 0635 10/19/19 0810   10/19/19 0315  metroNIDAZOLE (FLAGYL) IVPB 500 mg     500 mg 100 mL/hr over 60 Minutes Intravenous  Once 10/19/19 0314 10/19/19 0431   10/19/19 0100  cefTRIAXone (ROCEPHIN) 1 g in sodium chloride 0.9 % 100 mL IVPB     1 g 200 mL/hr over 30 Minutes Intravenous  Once 10/19/19 0053 10/19/19 0215   10/19/19 0100  azithromycin (ZITHROMAX) 500 mg in sodium chloride 0.9 % 250 mL IVPB     500 mg 250 mL/hr over 60 Minutes Intravenous  Once 10/19/19 0053 10/19/19 0227      Subjective: Patient continues to cough, expectorated clots, no fresh blood.  Still feels short of breath and has difficulty carrying on a conversation.  Continues to have a cough which is productive of dark-colored old blood  Objective: Vitals:   10/26/19 0813 10/26/19 0907 10/26/19 1331 10/26/19 1410  BP: (!) 132/93   140/73  Pulse:    86  Resp:    16  Temp:    97.7 F (36.5 C)  TempSrc:     Oral  SpO2:  98% 98% 100%  Weight:      Height:        Intake/Output Summary (Last 24 hours) at 10/26/2019 1805 Last data filed at 10/26/2019 0533 Gross per 24 hour  Intake --  Output 800 ml  Net -800 ml   Filed Weights   10/22/19 0500 10/24/19 0500 10/25/19 0542  Weight: 54.6 kg 61 kg 60.7 kg    Examination:  General exam: Alert, awake, oriented x 3 Respiratory system: Diminished breath sounds with mild wheeze bilaterally.  Increased respiratory effort. Cardiovascular system:RRR. No murmurs, rubs, gallops. Gastrointestinal system: Abdomen is nondistended, soft and nontender. No organomegaly or masses felt. Normal bowel sounds heard. Central nervous system: Alert and oriented. No focal neurological deficits. Extremities: 1+ edema bilaterally Skin: No rashes, lesions or ulcers Psychiatry: Judgement and insight appear normal. Mood & affect appropriate.    Data Reviewed: I have personally reviewed following labs and imaging studies  CBC: Recent Labs  Lab 10/22/19 0448 10/23/19 0513 10/24/19 0625 10/25/19 0623 10/26/19 0617  WBC 12.2* 14.5* 16.9* 12.4* 14.7*  HGB 7.7* 8.5* 8.3* 8.0* 8.6*  HCT 25.0* 27.0* 27.1* 25.4* 27.5*  MCV 100.4* 101.5* 103.0* 102.8* 105.4*  PLT 278 302 352 349 272   Basic Metabolic Panel: Recent Labs  Lab 10/20/19 0450 10/21/19 0343 10/22/19 0448 10/23/19 5366 10/24/19 4403 10/25/19 4742  10/26/19 0617  NA 144 143 143 144 141 141 141  K 3.3* 4.1 3.7 4.5 4.4 4.0 4.2  CL 104 106 106 108 107 108 106  CO2 29 25 25 26 25 24 25   GLUCOSE 61* 113* 120* 187* 128* 128* 182*  BUN 33* 27* 22 23 28* 36* 32*  CREATININE 1.59* 1.73* 1.57* 1.54* 1.62* 1.61* 1.47*  CALCIUM 7.8* 7.9* 8.3* 8.2* 8.3* 8.1* 8.0*  MG 2.2 2.2  --   --   --   --   --   PHOS  --   --   --  2.2*  --   --   --    GFR: Estimated Creatinine Clearance: 37.9 mL/min (A) (by C-G formula based on SCr of 1.47 mg/dL (H)). Liver Function Tests: Recent Labs  Lab 10/20/19 0450  10/23/19 0513  AST 40  --   ALT 32  --   ALKPHOS 47  --   BILITOT 1.7*  --   PROT 5.3*  --   ALBUMIN 2.6* 2.7*   No results for input(s): LIPASE, AMYLASE in the last 168 hours. No results for input(s): AMMONIA in the last 168 hours. Coagulation Profile: Recent Labs  Lab 10/22/19 0448 10/23/19 0513 10/24/19 0625 10/25/19 0623 10/26/19 0617  INR 2.5* 3.4* 2.4* 1.6* 1.4*   Cardiac Enzymes: No results for input(s): CKTOTAL, CKMB, CKMBINDEX, TROPONINI in the last 168 hours. BNP (last 3 results) No results for input(s): PROBNP in the last 8760 hours. HbA1C: No results for input(s): HGBA1C in the last 72 hours. CBG: Recent Labs  Lab 10/25/19 1643 10/25/19 2052 10/26/19 0708 10/26/19 1136 10/26/19 1653  GLUCAP 173* 225* 165* 177* 194*   Lipid Profile: No results for input(s): CHOL, HDL, LDLCALC, TRIG, CHOLHDL, LDLDIRECT in the last 72 hours. Thyroid Function Tests: No results for input(s): TSH, T4TOTAL, FREET4, T3FREE, THYROIDAB in the last 72 hours. Anemia Panel: No results for input(s): VITAMINB12, FOLATE, FERRITIN, TIBC, IRON, RETICCTPCT in the last 72 hours. Sepsis Labs: Recent Labs  Lab 10/20/19 0450  LATICACIDVEN 1.2    Recent Results (from the past 240 hour(s))  Respiratory Panel by RT PCR (Flu A&B, Covid) - Nasopharyngeal Swab     Status: None   Collection Time: 10/18/19 11:18 PM   Specimen: Nasopharyngeal Swab  Result Value Ref Range Status   SARS Coronavirus 2 by RT PCR NEGATIVE NEGATIVE Final    Comment: (NOTE) SARS-CoV-2 target nucleic acids are NOT DETECTED. The SARS-CoV-2 RNA is generally detectable in upper respiratoy specimens during the acute phase of infection. The lowest concentration of SARS-CoV-2 viral copies this assay can detect is 131 copies/mL. A negative result does not preclude SARS-Cov-2 infection and should not be used as the sole basis for treatment or other patient management decisions. A negative result may occur with  improper  specimen collection/handling, submission of specimen other than nasopharyngeal swab, presence of viral mutation(s) within the areas targeted by this assay, and inadequate number of viral copies (<131 copies/mL). A negative result must be combined with clinical observations, patient history, and epidemiological information. The expected result is Negative. Fact Sheet for Patients:  PinkCheek.be Fact Sheet for Healthcare Providers:  GravelBags.it This test is not yet ap proved or cleared by the [place] FDA and  has been authorized for detection and/or diagnosis of SARS-CoV-2 by FDA under an Emergency Use Authorization (EUA). This EUA will remain  in effect (meaning this test can be used) for the duration of the COVID-19 declaration under Section  564(b)(1) of the Act, 21 U.S.C. section 360bbb-3(b)(1), unless the authorization is terminated or revoked sooner.    Influenza A by PCR NEGATIVE NEGATIVE Final   Influenza B by PCR NEGATIVE NEGATIVE Final    Comment: (NOTE) The Xpert Xpress SARS-CoV-2/FLU/RSV assay is intended as an aid in  the diagnosis of influenza from Nasopharyngeal swab specimens and  should not be used as a sole basis for treatment. Nasal washings and  aspirates are unacceptable for Xpert Xpress SARS-CoV-2/FLU/RSV  testing. Fact Sheet for Patients: PinkCheek.be Fact Sheet for Healthcare Providers: GravelBags.it This test is not yet approved or cleared by the [place] FDA and  has been authorized for detection and/or diagnosis of SARS-CoV-2 by  FDA under an Emergency Use Authorization (EUA). This EUA will remain  in effect (meaning this test can be used) for the duration of the  Covid-19 declaration under Section 564(b)(1) of the Act, 21  U.S.C. section 360bbb-3(b)(1), unless the authorization is  terminated or revoked. Performed at [facility], [address] [place], [place] [address]   Blood culture (routine x 2)     Status: None   Collection Time: 10/19/19  3:12 AM   Specimen: BLOOD LEFT ARM  Result Value Ref Range Status   Specimen Description BLOOD LEFT ARM  Final   Special Requests   Final    BOTTLES DRAWN AEROBIC ONLY Blood Culture adequate volume   Culture   Final    NO GROWTH 5 DAYS Performed at [facility], [address] [place], [place] [address]    Report Status 10/24/2019 FINAL  Final  Blood culture (routine x 2)     Status: None   Collection Time: 10/19/19  5:19 AM   Specimen: Left Antecubital; Blood  Result Value Ref Range Status   Specimen Description LEFT ANTECUBITAL  Final   Special Requests   Final    BOTTLES DRAWN AEROBIC AND ANAEROBIC Blood Culture adequate volume   Culture   Final    NO GROWTH 5 DAYS Performed at [facility], [address] [place], [place] [address]    Report Status 10/24/2019 FINAL  Final  MRSA PCR Screening     Status: None   Collection Time: 10/19/19  6:17 AM   Specimen: Nasopharyngeal  Result Value Ref Range Status   MRSA by PCR NEGATIVE NEGATIVE Final    Comment:        The GeneXpert MRSA Assay (FDA approved for NASAL specimens only), is one component of a comprehensive MRSA colonization surveillance program. It is not intended to diagnose MRSA infection nor to guide or monitor treatment for MRSA infections. Performed at [facility], [address] [place], [place] [address]   Expectorated sputum assessment w rflx to resp cult     Status: None   Collection Time: 10/24/19  1:37 PM   Specimen: Expectorated Sputum  Result Value Ref Range Status   Specimen Description EXPECTORATED SPUTUM  Final   Special Requests NONE  Final   Sputum evaluation   Final    THIS SPECIMEN IS ACCEPTABLE FOR SPUTUM CULTURE Performed at [facility], [address] [place], [place] [address]    Report Status 10/24/2019 FINAL  Final  Culture, respiratory     Status: None    Collection Time: 10/24/19  1:37 PM  Result Value Ref Range Status   Specimen Description   Final    EXPECTORATED SPUTUM Performed at [facility], [address] [place], [place] [address]    Special Requests  Final    NONE Reflexed from [phone] Performed at [facility], [address], [place], [place] [address]    Gram Stain   Final    ABUNDANT WBC PRESENT, PREDOMINANTLY PMN FEW SQUAMOUS EPITHELIAL CELLS PRESENT ABUNDANT YEAST Performed at [facility], [place] [address], [place], [place] [address]    Culture MODERATE CANDIDA ALBICANS  Final   Report Status 10/26/2019 FINAL  Final         Radiology Studies: No results found.      Scheduled Meds: . acetylcysteine  4 mL Nebulization TID  . atorvastatin  10 mg Oral Daily  . budesonide (PULMICORT) nebulizer solution  0.25 mg Nebulization BID  . Chlorhexidine Gluconate Cloth  6 each Topical Daily  . diltiazem  30 mg Oral Q12H  . feeding supplement  1 Container Oral TID WC & HS  . furosemide  20 mg Oral Daily  . guaiFENesin  1,200 mg Oral BID  . insulin aspart  0-6 Units Subcutaneous TID WC  . ipratropium-albuterol  3 mL Nebulization Q6H  . mouth rinse  15 mL Mouth Rinse BID  . methocarbamol  500 mg Oral QID  . methylPREDNISolone (SOLU-MEDROL) injection  60 mg Intravenous Q12H  . metoprolol tartrate  50 mg Oral BID  . pantoprazole  40 mg Oral BID AC  . sodium bicarbonate  650 mg Oral QID  . sodium chloride flush  3 mL Intravenous Q12H   Continuous Infusions: . sodium chloride       LOS: 7 days    Time spent: 30 minutes    Kathie Dike, MD [facility]  If 7PM-7AM, please contact night-coverage [email or web address]  10/26/2019, 6:05 PM

## 2019-10-26 NOTE — Progress Notes (Signed)
Patient refused Mucomyst in neb says it makes him nauseated.

## 2019-10-27 ENCOUNTER — Inpatient Hospital Stay (HOSPITAL_COMMUNITY): Payer: Medicare HMO

## 2019-10-27 LAB — CBC
HCT: 28.7 % — ABNORMAL LOW (ref 39.0–52.0)
Hemoglobin: 8.8 g/dL — ABNORMAL LOW (ref 13.0–17.0)
MCH: 32.1 pg (ref 26.0–34.0)
MCHC: 30.7 g/dL (ref 30.0–36.0)
MCV: 104.7 fL — ABNORMAL HIGH (ref 80.0–100.0)
Platelets: 378 10*3/uL (ref 150–400)
RBC: 2.74 MIL/uL — ABNORMAL LOW (ref 4.22–5.81)
RDW: 23.1 % — ABNORMAL HIGH (ref 11.5–15.5)
WBC: 13.6 10*3/uL — ABNORMAL HIGH (ref 4.0–10.5)
nRBC: 4.4 % — ABNORMAL HIGH (ref 0.0–0.2)

## 2019-10-27 LAB — BASIC METABOLIC PANEL
Anion gap: 12 (ref 5–15)
BUN: 41 mg/dL — ABNORMAL HIGH (ref 8–23)
CO2: 27 mmol/L (ref 22–32)
Calcium: 8.3 mg/dL — ABNORMAL LOW (ref 8.9–10.3)
Chloride: 105 mmol/L (ref 98–111)
Creatinine, Ser: 1.45 mg/dL — ABNORMAL HIGH (ref 0.61–1.24)
GFR calc Af Amer: 55 mL/min — ABNORMAL LOW (ref >60)
GFR calc non Af Amer: 47 mL/min — ABNORMAL LOW (ref >60)
Glucose, Bld: 164 mg/dL — ABNORMAL HIGH (ref 70–99)
Potassium: 3.8 mmol/L (ref 3.5–5.1)
Sodium: 144 mmol/L (ref 135–145)

## 2019-10-27 LAB — GLUCOSE, CAPILLARY
Glucose-Capillary: 153 mg/dL — ABNORMAL HIGH (ref 70–99)
Glucose-Capillary: 191 mg/dL — ABNORMAL HIGH (ref 70–99)
Glucose-Capillary: 230 mg/dL — ABNORMAL HIGH (ref 70–99)
Glucose-Capillary: 174 mg/dL — ABNORMAL HIGH (ref 70–99)

## 2019-10-27 LAB — PROTIME-INR
INR: 1.5 — ABNORMAL HIGH (ref 0.8–1.2)
Prothrombin Time: 18 seconds — ABNORMAL HIGH (ref 11.4–15.2)

## 2019-10-27 MED ORDER — FUROSEMIDE 10 MG/ML IJ SOLN
20.0000 mg | Freq: Once | INTRAMUSCULAR | Status: AC
  Administered 2019-10-27: 23:00:00 20 mg via INTRAVENOUS
  Filled 2019-10-27: qty 2

## 2019-10-27 NOTE — Progress Notes (Signed)
Pt eating breakfast 

## 2019-10-27 NOTE — Progress Notes (Signed)
Physical Therapy Treatment Patient Details Name: Blake Wise MRN: [id number] DOB: [birth year] Today's Date: 10/27/2019    History of Present Illness Blake Wise is a 74 y.o. male with medical history significant of atrial fibrillation, congestive heart failure, COPD, hypertension, hyperlipidemia, diabetes mellitus type 2 who presented to the ER with worsening shortness of breath, abdominal pain.  Patient is states he has been having left upper quadrant abdominal pain for the last 1 week.  He is also had multiple episodes of vomiting.  Patient reports progressively worsening shortness of breath, described as dyspnea on exertion.  He is also had a cough with bloody sputum over the last 2 days.  Today he had palpitations which persisted through the day.  No chest pain reported.  No fever, chills.  No hematemesis, melena, hematochezia, dysuria, diarrhea, syncope, seizures, dizziness, lightheadedness, focal deficits, blurring of vision.    PT Comments    Patient demonstrates increased endurance/distance for ambulation in room, made it to doorway and back to bedside, limited secondary to c/o fatigue and SOB, on 2 LPM with SpO2 at 92-93% during ambulation, once seated in chair patient requested his O2 raised to 3 LPM due to difficulty breathing.  Patient tolerated staying up in chair after therapy - NT notified.  Patient will benefit from continued physical therapy in hospital and recommended venue below to increase strength, balance, endurance for safe ADLs and gait.   Follow Up Recommendations  Home health PT     Equipment Recommendations  None recommended by PT    Recommendations for Other Services       Precautions / Restrictions Precautions Precautions: Fall Restrictions Weight Bearing Restrictions: No    Mobility  Bed Mobility Overal bed mobility: Modified Independent             General bed mobility comments: slightly increased time  Transfers Overall transfer level: Needs  assistance Equipment used: Rolling walker (2 wheeled) Transfers: Sit to/from [employer] Transfers Sit to Stand: Supervision Stand pivot transfers: Supervision       General transfer comment: increased time, slightly labored movement  Ambulation/Gait Ambulation/Gait assistance: Supervision;Min guard Gait Distance (Feet): 20 Feet Assistive device: None Gait Pattern/deviations: Decreased step length - right;Decreased step length - left;Decreased stride length Gait velocity: decreased   General Gait Details: slow slightly labored cadence without loss of balance, limited mostly due to c/o fatigue and SOB, on 2 LPM with SpO2 maintaining 92-93%   Stairs             Wheelchair Mobility    Modified Rankin (Stroke Patients Only)       Balance Overall balance assessment: Mild deficits observed, not formally tested                                          Cognition Arousal/Alertness: Awake/alert Behavior During Therapy: WFL for tasks assessed/performed Overall Cognitive Status: Within Functional Limits for tasks assessed                                        Exercises General Exercises - Lower Extremity Long Arc Quad: Seated;AROM;Strengthening;Both;10 reps Hip Flexion/Marching: Seated;AROM;Strengthening;Both;10 reps Toe Raises: Seated;AROM;Strengthening;Both;10 reps Heel Raises: Seated;AROM;Strengthening;Both;10 reps    General Comments        Pertinent Vitals/Pain Pain Assessment: No/denies pain    Home  Living                      Prior Function            PT Goals (current goals can now be found in the care plan section) Acute Rehab PT Goals Patient Stated Goal: return home with friends to assist PT Goal Formulation: With patient Time For Goal Achievement: 10/29/19 Potential to Achieve Goals: Good Progress towards PT goals: Progressing toward goals    Frequency    Min 3X/week      PT Plan       Co-evaluation              AM-PAC PT "6 Clicks" Mobility   Outcome Measure  Help needed turning from your back to your side while in a flat bed without using bedrails?: None Help needed moving from lying on your back to sitting on the side of a flat bed without using bedrails?: None Help needed moving to and from a bed to a chair (including a wheelchair)?: A Little Help needed standing up from a chair using your arms (e.g., wheelchair or bedside chair)?: A Little Help needed to walk in hospital room?: A Little Help needed climbing 3-5 steps with a railing? : A Little 6 Click Score: 20    End of Session Equipment Utilized During Treatment: Oxygen Activity Tolerance: Patient tolerated treatment well;Patient limited by fatigue Patient left: in chair;with call bell/phone within reach Nurse Communication: Mobility status PT Visit Diagnosis: Unsteadiness on feet (R26.81);Other abnormalities of gait and mobility (R26.89);Muscle weakness (generalized) (M62.81)     Time: 5284-1324 PT Time Calculation (min) (ACUTE ONLY): 25 min  Charges:  $Therapeutic Exercise: 8-22 mins $Therapeutic Activity: 8-22 mins                     2:08 PM, 10/27/19 Lonell Grandchild, MPT Physical Therapist with [facility] [phone] office [phone] mobile phone

## 2019-10-27 NOTE — Care Management Important Message (Signed)
Important Message  Patient Details  Name: Blake Wise MRN: [id number] Date of Birth: 1945-12-01   Medicare Important Message Given:  Yes(Morgan, RN will deliver letter to patient)     [name] 10/27/2019, 2:24 PM

## 2019-10-27 NOTE — Progress Notes (Signed)
PROGRESS NOTE    Blake Wise  FOY:[id number] DOB: [birth year] DOA: 10/18/2019 PCP: Neale Burly, MD   Brief Narrative:  Per HPI: Blake Wise a 74 y.o.malewith medical history significant ofatrial fibrillation, congestive heart failure, COPD, hypertension, hyperlipidemia, diabetes mellitus type 2 who presented to the ER with worsening shortness of breath, abdominal pain. Patient is states he has been having left upper quadrant abdominal pain for the last 1 week. He is also had multiple episodes of vomiting. Patient reports progressively worsening shortness of breath, described as dyspnea on exertion.He is also had a cough with bloody sputum over the last 2 days. Today he had palpitations which persisted through the day. No chest pain reported. No fever, chills. No hematemesis, melena, hematochezia, dysuria, diarrhea, syncope, seizures, dizziness, lightheadedness, focal deficits, blurring of vision.  Assessment & Plan:   Principal Problem:   Atrial fibrillation with RVR (HCC) Active Problems:   Chronic diastolic CHF (congestive heart failure) (HCC)   Chest pain   DM2 (diabetes mellitus, type 2) (HCC)   HTN (hypertension)   Acute urinary retention   Elevated INR   Community acquired pneumonia   Enteritis   Acute blood loss anemia   Sepsis-multifactorial with enteritis as well as right-sided pneumonia with concern for aspiration-improving -He was treated with Zosyn and azithromycin  -Is completed 7 days of antibiotics. -Blood culture with no growth x3 days -No further diarrhea, vomiting, abdominal pain  Atrial fibrillation with RVR secondary to above-resolved -IV Cardizem drip discontinued and patient transition to oral Cardizem 30 mg every 12 hours  -Continue on home dose of metoprolol -2D echo with LVEF 50-55% -Patient had supratherapeutic INR which has now resolved -Coumadin currently on hold in light of hemoptysis  Acute anemia-suspect blood loss -Patient  with 2 dark bowel movements prior to admission and had a significant drop in hemoglobin levels with soft blood pressure readings -Unclear if this is truly related to blood loss versus hemodilution -Seen by GI with plans for outpatient endoscopy unless he has gross bleeding or transfusion dependent anemia -Hemoglobin is currently stable -Received a dose of IV iron -Coumadin currently on hold in the setting of anemia/bleeding  Hemoptysis -Patient was having frank blood/hemoptysis with cough.  This had progressively been getting worse -We will hold further anticoagulation -CT chest shows pneumonia versus pulmonary hemorrhage -Discussed with Dr. Halford Chessman, recommended continuing to treat pneumonia and reverse INR.  If hemoptysis persists, may need further investigation  Mild acute on chronic hypoxemic respiratory failure -Currently on 3 L and baseline 2 L at home -Wean as tolerated  Supratherapeutic INR-resolved -Appreciate pharmacy management -Given vitamin K on 1/3, 1/7  Troponin elevation likely secondary to demand ischemia -No chest pain or findings of ACS on EKG  Chronic diastolic congestive heart failure -Patient does have some lower extremity edema. -Follow daily weights -Repeat 2D echocardiogram with noted LVEF 50-55% -Continue home dose of Lasix  Hypertension-stable -Continue metoprolol as well as Cardizem and monitor carefully  Type 2 diabetes -A1c 5.8 -Hold Januvia for now -Blood sugars stable -Continue on sliding scale insulin  Chronic COPD on home 2 L nasal cannula oxygen -Patient reports that normally, he is functional is able to ambulate without difficulty -Currently, he continues to be dyspneic on ambulation despite wearing oxygen. -Continue bronchodilators -Continue intravenous/inhaled steroids -Continue Mucomyst nebs. -Repeat shows improving infiltrates. -Improvement has been slow, continue current treatments   DVT prophylaxis: Coumadin with INR  2.5 Code Status: Full Family Communication: None at bedside Disposition Plan:  Continue current  treatments.  Possible discharge home in the next 24 hours of shortness of breath continues to improve and is able to ambulate without becoming dyspneic.   Consultants:   GI  Procedures:   None  Antimicrobials:  Anti-infectives (From admission, onward)   Start     Dose/Rate Route Frequency Ordered Stop   10/24/19 1200  amoxicillin-clavulanate (AUGMENTIN) 875-125 MG per tablet 1 tablet     1 tablet Oral Every 12 hours 10/24/19 0940 10/25/19 2104   10/20/19 0200  azithromycin (ZITHROMAX) 500 mg in sodium chloride 0.9 % 250 mL IVPB  Status:  Discontinued     500 mg 250 mL/hr over 60 Minutes Intravenous Every 24 hours 10/19/19 0622 10/24/19 0937   10/19/19 1400  piperacillin-tazobactam (ZOSYN) IVPB 3.375 g  Status:  Discontinued     3.375 g 12.5 mL/hr over 240 Minutes Intravenous Every 8 hours 10/19/19 0635 10/24/19 0940   10/19/19 0700  piperacillin-tazobactam (ZOSYN) IVPB 3.375 g     3.375 g 100 mL/hr over 30 Minutes Intravenous  Once 10/19/19 0635 10/19/19 0810   10/19/19 0315  metroNIDAZOLE (FLAGYL) IVPB 500 mg     500 mg 100 mL/hr over 60 Minutes Intravenous  Once 10/19/19 0314 10/19/19 0431   10/19/19 0100  cefTRIAXone (ROCEPHIN) 1 g in sodium chloride 0.9 % 100 mL IVPB     1 g 200 mL/hr over 30 Minutes Intravenous  Once 10/19/19 0053 10/19/19 0215   10/19/19 0100  azithromycin (ZITHROMAX) 500 mg in sodium chloride 0.9 % 250 mL IVPB     500 mg 250 mL/hr over 60 Minutes Intravenous  Once 10/19/19 0053 10/19/19 0227      Subjective: Still feels short of breath, but slowly improving.  Has blood-tinged sputum.  Objective: Vitals:   10/27/19 0243 10/27/19 0517 10/27/19 0847 10/27/19 1349  BP:  (!) 149/90    Pulse:  67    Resp:  18    Temp:  97.6 F (36.4 C)    TempSrc:  Oral    SpO2: 96% 100% 94% 91%  Weight:  58.9 kg    Height:        Intake/Output Summary (Last 24  hours) at 10/27/2019 1916 Last data filed at 10/27/2019 0615 Gross per 24 hour  Intake --  Output 2900 ml  Net -2900 ml   Filed Weights   10/24/19 0500 10/25/19 0542 10/27/19 0517  Weight: 61 kg 60.7 kg 58.9 kg    Examination:  General exam: Alert, awake, oriented x 3 Respiratory system: improving breath sounds, mild wheeze Cardiovascular system:RRR. No murmurs, rubs, gallops. Gastrointestinal system: Abdomen is nondistended, soft and nontender. No organomegaly or masses felt. Normal bowel sounds heard. Central nervous system: Alert and oriented. No focal neurological deficits. Extremities: 1+ edema bilaterally Skin: No rashes, lesions or ulcers Psychiatry: Judgement and insight appear normal. Mood & affect appropriate.    Data Reviewed: I have personally reviewed following labs and imaging studies  CBC: Recent Labs  Lab 10/23/19 0513 10/24/19 0625 10/25/19 0623 10/26/19 0617 10/27/19 0629  WBC 14.5* 16.9* 12.4* 14.7* 13.6*  HGB 8.5* 8.3* 8.0* 8.6* 8.8*  HCT 27.0* 27.1* 25.4* 27.5* 28.7*  MCV 101.5* 103.0* 102.8* 105.4* 104.7*  PLT 302 352 349 387 660   Basic Metabolic Panel: Recent Labs  Lab 10/21/19 0343 10/23/19 0513 10/24/19 0625 10/25/19 0623 10/26/19 0617 10/27/19 0629  NA 143 144 141 141 141 144  K 4.1 4.5 4.4 4.0 4.2 3.8  CL 106 108 107 108 106 105  CO2 25 26 25 24 25 27   GLUCOSE 113* 187* 128* 128* 182* 164*  BUN 27* 23 28* 36* 32* 41*  CREATININE 1.73* 1.54* 1.62* 1.61* 1.47* 1.45*  CALCIUM 7.9* 8.2* 8.3* 8.1* 8.0* 8.3*  MG 2.2  --   --   --   --   --   PHOS  --  2.2*  --   --   --   --    GFR: Estimated Creatinine Clearance: 37.2 mL/min (A) (by C-G formula based on SCr of 1.45 mg/dL (H)). Liver Function Tests: Recent Labs  Lab 10/23/19 0513  ALBUMIN 2.7*   No results for input(s): LIPASE, AMYLASE in the last 168 hours. No results for input(s): AMMONIA in the last 168 hours. Coagulation Profile: Recent Labs  Lab 10/23/19 0513  10/24/19 0625 10/25/19 0623 10/26/19 0617 10/27/19 0629  INR 3.4* 2.4* 1.6* 1.4* 1.5*   Cardiac Enzymes: No results for input(s): CKTOTAL, CKMB, CKMBINDEX, TROPONINI in the last 168 hours. BNP (last 3 results) No results for input(s): PROBNP in the last 8760 hours. HbA1C: No results for input(s): HGBA1C in the last 72 hours. CBG: Recent Labs  Lab 10/26/19 1653 10/26/19 2135 10/27/19 0733 10/27/19 1115 10/27/19 1635  GLUCAP 194* 190* 153* 191* 230*   Lipid Profile: No results for input(s): CHOL, HDL, LDLCALC, TRIG, CHOLHDL, LDLDIRECT in the last 72 hours. Thyroid Function Tests: No results for input(s): TSH, T4TOTAL, FREET4, T3FREE, THYROIDAB in the last 72 hours. Anemia Panel: No results for input(s): VITAMINB12, FOLATE, FERRITIN, TIBC, IRON, RETICCTPCT in the last 72 hours. Sepsis Labs: No results for input(s): PROCALCITON, LATICACIDVEN in the last 168 hours.  Recent Results (from the past 240 hour(s))  Respiratory Panel by RT PCR (Flu A&B, Covid) - Nasopharyngeal Swab     Status: None   Collection Time: 10/18/19 11:18 PM   Specimen: Nasopharyngeal Swab  Result Value Ref Range Status   SARS Coronavirus 2 by RT PCR NEGATIVE NEGATIVE Final    Comment: (NOTE) SARS-CoV-2 target nucleic acids are NOT DETECTED. The SARS-CoV-2 RNA is generally detectable in upper respiratoy specimens during the acute phase of infection. The lowest concentration of SARS-CoV-2 viral copies this assay can detect is 131 copies/mL. A negative result does not preclude SARS-Cov-2 infection and should not be used as the sole basis for treatment or other patient management decisions. A negative result may occur with  improper specimen collection/handling, submission of specimen other than nasopharyngeal swab, presence of viral mutation(s) within the areas targeted by this assay, and inadequate number of viral copies (<131 copies/mL). A negative result must be combined with clinical observations,  patient history, and epidemiological information. The expected result is Negative. Fact Sheet for Patients:  PinkCheek.be Fact Sheet for Healthcare Providers:  GravelBags.it This test is not yet ap proved or cleared by the [place] FDA and  has been authorized for detection and/or diagnosis of SARS-CoV-2 by FDA under an Emergency Use Authorization (EUA). This EUA will remain  in effect (meaning this test can be used) for the duration of the COVID-19 declaration under Section 564(b)(1) of the Act, 21 U.S.C. section 360bbb-3(b)(1), unless the authorization is terminated or revoked sooner.    Influenza A by PCR NEGATIVE NEGATIVE Final   Influenza B by PCR NEGATIVE NEGATIVE Final    Comment: (NOTE) The Xpert Xpress SARS-CoV-2/FLU/RSV assay is intended as an aid in  the diagnosis of influenza from Nasopharyngeal swab specimens and  should not be used as a sole basis for treatment.  Nasal washings and  aspirates are unacceptable for Xpert Xpress SARS-CoV-2/FLU/RSV  testing. Fact Sheet for Patients: PinkCheek.be Fact Sheet for Healthcare Providers: GravelBags.it This test is not yet approved or cleared by the [place] FDA and  has been authorized for detection and/or diagnosis of SARS-CoV-2 by  FDA under an Emergency Use Authorization (EUA). This EUA will remain  in effect (meaning this test can be used) for the duration of the  Covid-19 declaration under Section 564(b)(1) of the Act, 21  U.S.C. section 360bbb-3(b)(1), unless the authorization is  terminated or revoked. Performed at [facility], [address] [place], [place] [address]   Blood culture (routine x 2)     Status: None   Collection Time: 10/19/19  3:12 AM   Specimen: BLOOD LEFT ARM  Result Value Ref Range Status   Specimen Description BLOOD LEFT ARM  Final   Special Requests   Final    BOTTLES  DRAWN AEROBIC ONLY Blood Culture adequate volume   Culture   Final    NO GROWTH 5 DAYS Performed at [facility], [address] [place], [place] [address]    Report Status 10/24/2019 FINAL  Final  Blood culture (routine x 2)     Status: None   Collection Time: 10/19/19  5:19 AM   Specimen: Left Antecubital; Blood  Result Value Ref Range Status   Specimen Description LEFT ANTECUBITAL  Final   Special Requests   Final    BOTTLES DRAWN AEROBIC AND ANAEROBIC Blood Culture adequate volume   Culture   Final    NO GROWTH 5 DAYS Performed at [facility], [address], [place], [place] [address]    Report Status 10/24/2019 FINAL  Final  MRSA PCR Screening     Status: None   Collection Time: 10/19/19  6:17 AM   Specimen: Nasopharyngeal  Result Value Ref Range Status   MRSA by PCR NEGATIVE NEGATIVE Final    Comment:        The GeneXpert MRSA Assay (FDA approved for NASAL specimens only), is one component of a comprehensive MRSA colonization surveillance program. It is not intended to diagnose MRSA infection nor to guide or monitor treatment for MRSA infections. Performed at [facility], [address], [place], [place] [address]   Expectorated sputum assessment w rflx to resp cult     Status: None   Collection Time: 10/24/19  1:37 PM   Specimen: Expectorated Sputum  Result Value Ref Range Status   Specimen Description EXPECTORATED SPUTUM  Final   Special Requests NONE  Final   Sputum evaluation   Final    THIS SPECIMEN IS ACCEPTABLE FOR SPUTUM CULTURE Performed at [facility], [address], [place], [place] [address]    Report Status 10/24/2019 FINAL  Final  Culture, respiratory     Status: None   Collection Time: 10/24/19  1:37 PM  Result Value Ref Range Status   Specimen Description   Final    EXPECTORATED SPUTUM Performed at [facility], [address], [place], [place] [address]    Special Requests   Final    NONE Reflexed from [id number] Performed at [facility], [address], [place], [place] [address]    Gram Stain   Final    ABUNDANT WBC PRESENT, PREDOMINANTLY PMN FEW SQUAMOUS EPITHELIAL CELLS PRESENT ABUNDANT YEAST Performed at [facility], [place] [address], [place], [place] [address]    Culture MODERATE CANDIDA ALBICANS  Final   Report Status 10/26/2019 FINAL  Final         Radiology Studies: DG CHEST PORT 1 VIEW  Result Date: 10/27/2019 CLINICAL DATA:  Shortness of breath EXAM: PORTABLE CHEST 1 VIEW COMPARISON:  October 22, 2019 FINDINGS: The heart size and mediastinal contours are unchanged. Aortic knob calcifications. Slight interval improvement in aeration and opacities throughout both lungs. Again noted is diffusely increased interstitial markings and patchy airspace opacity within the left mid lung. Hyperinflation of both lung zones is seen. No acute osseous abnormality. IMPRESSION: Slight interval improvement in hazy/patchy airspace opacity and aeration of both lungs. Electronically Signed   By: Prudencio Pair M.D.   On: 10/27/2019 05:58        Scheduled Meds: . acetylcysteine  4 mL Nebulization TID  . atorvastatin  10 mg Oral Daily  . budesonide (PULMICORT) nebulizer solution  0.25 mg Nebulization BID  . Chlorhexidine Gluconate Cloth  6 each Topical Daily  . diltiazem  30 mg Oral Q12H  . feeding supplement  1 Container Oral TID WC & HS  . furosemide  20 mg Oral Daily  . guaiFENesin  1,200 mg Oral BID  . insulin aspart  0-6 Units Subcutaneous TID WC  . ipratropium-albuterol  3 mL Nebulization Q6H  . mouth rinse  15 mL Mouth Rinse BID  . methocarbamol  500 mg Oral QID  . methylPREDNISolone (SOLU-MEDROL) injection  60 mg Intravenous Q12H  . metoprolol tartrate  50 mg Oral BID  . pantoprazole  40 mg Oral BID AC  . sodium bicarbonate  650 mg Oral QID  . sodium chloride flush  3 mL Intravenous Q12H   Continuous Infusions: . sodium chloride       LOS: 8 days    Time spent: 30 minutes    Kathie Dike,  MD [facility]  If 7PM-7AM, please contact night-coverage [email or web address]  10/27/2019, 7:16 PM

## 2019-10-28 DIAGNOSIS — R338 Other retention of urine: Secondary | ICD-10-CM

## 2019-10-28 LAB — GLUCOSE, CAPILLARY
Glucose-Capillary: 155 mg/dL — ABNORMAL HIGH (ref 70–99)
Glucose-Capillary: 176 mg/dL — ABNORMAL HIGH (ref 70–99)
Glucose-Capillary: 125 mg/dL — ABNORMAL HIGH (ref 70–99)

## 2019-10-28 LAB — PROTIME-INR
INR: 1.4 — ABNORMAL HIGH (ref 0.8–1.2)
Prothrombin Time: 17.1 seconds — ABNORMAL HIGH (ref 11.4–15.2)

## 2019-10-28 MED ORDER — PANTOPRAZOLE SODIUM 40 MG PO TBEC: 40.0000 mg | DELAYED_RELEASE_TABLET | Freq: Every day | ORAL | 0 refills | Status: AC

## 2019-10-28 MED ORDER — DILTIAZEM HCL 60 MG PO TABS: 60.0000 mg | ORAL_TABLET | Freq: Two times a day (BID) | ORAL | 0 refills | Status: AC

## 2019-10-28 MED ORDER — GUAIFENESIN ER 600 MG PO TB12: 1200.0000 mg | ORAL_TABLET | Freq: Two times a day (BID) | ORAL | 0 refills | Status: AC

## 2019-10-28 MED ORDER — FUROSEMIDE 10 MG/ML IJ SOLN
20.0000 mg | Freq: Once | INTRAMUSCULAR | Status: AC
  Administered 2019-10-28: 16:00:00 20 mg via INTRAVENOUS

## 2019-10-28 MED ORDER — PREDNISONE 20 MG PO TABS: ORAL_TABLET | ORAL | 0 refills | Status: AC

## 2019-10-28 NOTE — Progress Notes (Addendum)
Physical Therapy Treatment Patient Details Name: Blake Wise MRN: [id number] DOB: 1946-09-30 Today's Date: 10/28/2019    History of Present Illness Blake Wise is a 74 y.o. male with medical history significant of atrial fibrillation, congestive heart failure, COPD, hypertension, hyperlipidemia, diabetes mellitus type 2 who presented to the ER with worsening shortness of breath, abdominal pain.  Patient is states he has been having left upper quadrant abdominal pain for the last 1 week.  He is also had multiple episodes of vomiting.  Patient reports progressively worsening shortness of breath, described as dyspnea on exertion.  He is also had a cough with bloody sputum over the last 2 days.  Today he had palpitations which persisted through the day.  No chest pain reported.  No fever, chills.  No hematemesis, melena, hematochezia, dysuria, diarrhea, syncope, seizures, dizziness, lightheadedness, focal deficits, blurring of vision.    PT Comments    Patient stated he was more tired today than he has been but was willing to participate with PT. He was able to complete seated exercises without SOB and is educated on completing while seated for remainder of stay and upon returning home for improving LE circulation. He is able to transition to standing with use of RW due to stating he felt weaker today. He is able to ambulate increased distance today with use of RW for safety and balance. O2 is monitored throughout session above 92% and patient has no c/o SOB. Patient will benefit from continued physical therapy in hospital and recommended venue below to increase strength, balance, endurance for safe ADLs and gait.   Follow Up Recommendations  Home health PT     Equipment Recommendations  None recommended by PT    Recommendations for Other Services       Precautions / Restrictions Precautions Precautions: Fall Restrictions Weight Bearing Restrictions: No    Mobility  Bed Mobility Overal  bed mobility: Modified Independent             General bed mobility comments: slightly increased time  Transfers Overall transfer level: Needs assistance Equipment used: Rolling walker (2 wheeled) Transfers: Sit to/from [employer] Transfers Sit to Stand: Supervision Stand pivot transfers: Supervision       General transfer comment: increased time, slightly labored movement  Ambulation/Gait Ambulation/Gait assistance: Supervision;Min guard Gait Distance (Feet): 80 Feet Assistive device: Rolling walker (2 wheeled) Gait Pattern/deviations: Decreased step length - right;Decreased step length - left;Decreased stride length Gait velocity: decreased   General Gait Details: slow slightly labored cadence without loss of balance, limited mostly due to c/o fatigue, on 2 LPM with SpO2 maintaining 92-93%   Stairs             Wheelchair Mobility    Modified Rankin (Stroke Patients Only)       Balance Overall balance assessment: Mild deficits observed, not formally tested                                          Cognition Arousal/Alertness: Awake/alert Behavior During Therapy: WFL for tasks assessed/performed Overall Cognitive Status: Within Functional Limits for tasks assessed                                        Exercises General Exercises - Lower Extremity Ankle Circles/Pumps: AROM;Strengthening;Both;20 reps;Seated Long Arc  Quad: Seated;AROM;Strengthening;Both;20 reps Hip Flexion/Marching: Seated;AROM;Strengthening;Both;20 reps Toe Raises: Seated;AROM;Strengthening;Both;20 reps Heel Raises: Seated;AROM;Strengthening;Both;20 reps    General Comments        Pertinent Vitals/Pain Pain Assessment: No/denies pain    Home Living                      Prior Function            PT Goals (current goals can now be found in the care plan section) Acute Rehab PT Goals Patient Stated Goal: return home with  friends to assist PT Goal Formulation: With patient Time For Goal Achievement: 10/29/19 Potential to Achieve Goals: Good Progress towards PT goals: Progressing toward goals    Frequency    Min 3X/week      PT Plan Current plan remains appropriate    Co-evaluation              AM-PAC PT "6 Clicks" Mobility   Outcome Measure  Help needed turning from your back to your side while in a flat bed without using bedrails?: None Help needed moving from lying on your back to sitting on the side of a flat bed without using bedrails?: None Help needed moving to and from a bed to a chair (including a wheelchair)?: A Little Help needed standing up from a chair using your arms (e.g., wheelchair or bedside chair)?: A Little Help needed to walk in hospital room?: A Little Help needed climbing 3-5 steps with a railing? : A Little 6 Click Score: 20    End of Session Equipment Utilized During Treatment: Oxygen Activity Tolerance: Patient tolerated treatment well;Patient limited by fatigue Patient left: in chair;with call bell/phone within reach Nurse Communication: Mobility status PT Visit Diagnosis: Unsteadiness on feet (R26.81);Other abnormalities of gait and mobility (R26.89);Muscle weakness (generalized) (M62.81)     Time: 5726-2035 PT Time Calculation (min) (ACUTE ONLY): 25 min  Charges:  $Therapeutic Exercise: 8-22 mins $Therapeutic Activity: 8-22 mins                     3:22 PM, 10/28/19 Mearl Latin PT, DPT Physical Therapist at [facility]

## 2019-10-28 NOTE — Plan of Care (Signed)

## 2019-10-28 NOTE — TOC Transition Note (Addendum)
Transition of Care [facility]) - CM/SW Discharge Note   Patient Details  Name: Blake Wise MRN: [id number] Date of Birth: 05-24-1946  Transition of [facility] CM/SW Contact:  Jayelyn Barno, Chauncey Reading, RN Phone Number: 10/28/2019, 4:51 PM   Clinical Narrative:   Patient discharging home. Home health ordered. Will ship RW to house if patient wants one. Sister to transport home and bring portable oxygen tank for transport.    Final next level of care: [place] Barriers to Discharge: Barriers Resolved   Patient Goals and CMS Choice Patient states their goals for this hospitalization and ongoing recovery are:: return home CMS Medicare.gov Compare Post Acute Care list provided to:: Patient Choice offered to / list presented to : Patient     Discharge Plan and Services   Discharge Planning Services: CM Consult Post Acute Care Choice: Home Health                    HH Arranged: PT [place]: [facility] [employer] Date [place]: 10/23/19 Time [place]: 1104 Representative spoke with at [place]: [facility]  Social Determinants of Health ([place]) Interventions     Readmission Risk Interventions No flowsheet data found.

## 2019-10-31 NOTE — Discharge Summary (Signed)
Physician Discharge Summary  Laura Radilla ZOX:[id number] DOB: [birth year] DOA: 10/18/2019  PCP: Neale Burly, MD  Admit date: 10/18/2019 Discharge date: 10/28/2019  Admitted From: home Disposition:  home  Recommendations for Outpatient Follow-up:  1. Follow up with PCP in 1-2 weeks 2. Please obtain BMP/CBC in one week 3. Repeat CT chest in 3-4 weeks to re evaluate pneumonia/pulmonary hemorrhage and exclude underlying malignancy 4. Follow up with GI for outpatient endoscopy  Discharge Condition:stable CODE STATUS:full code Diet recommendation: heart healthy, carb mod  Brief/Interim Summary: Per HPI: Kona Mannsis a 74 y.o.malewith medical history significant ofatrial fibrillation, congestive heart failure, COPD, hypertension, hyperlipidemia, diabetes mellitus type 2 who presented to the ER with worsening shortness of breath, abdominal pain. Patient is states he has been having left upper quadrant abdominal pain for the last 1 week. He is also had multiple episodes of vomiting. Patient reports progressively worsening shortness of breath, described as dyspnea on exertion.He is also had a cough with bloody sputum over the last 2 days. Today he had palpitations which persisted through the day. No chest pain reported. No fever, chills. No hematemesis, melena, hematochezia, dysuria, diarrhea, syncope, seizures, dizziness, lightheadedness, focal deficits, blurring of vision.  Discharge Diagnoses:  Principal Problem:   Atrial fibrillation with RVR (HCC) Active Problems:   Chronic diastolic CHF (congestive heart failure) (HCC)   Chest pain   DM2 (diabetes mellitus, type 2) (HCC)   HTN (hypertension)   Acute urinary retention   Elevated INR   Community acquired pneumonia   Enteritis   Acute blood loss anemia  Sepsis-multifactorial with enteritis as well as right-sided pneumonia with concern for aspiration-improving -He was treated with Zosyn and azithromycin  -He completed 7  days of antibiotics. -Blood culture with no growth x3 days -No further diarrhea, vomiting, abdominal pain  Atrial fibrillation with RVR secondary to above-resolved -IV Cardizem drip discontinued and patient transitioned to oral Cardizem 30 mg every 12 hours  -Continue on home dose of metoprolol -2D echo with LVEF 50-55% -Patient had supratherapeutic INR which has now resolved -Coumadin was briefly held in light of hemoptysis. Since hemoptysis has improved at the time of discharge, coumadin can be resumed  Acute anemia-suspect blood loss -Patient with 2 dark bowel movements prior to admission and had a significant drop in hemoglobin levels with soft blood pressure readings -Unclear if this is truly related to blood loss versus hemodilution -Seen by GI with plans for outpatient endoscopy unless he has gross bleeding or transfusion dependent anemia -Hemoglobin is currently stable -Received a dose of IV iron  Hemoptysis -Patient was having frank blood/hemoptysis with cough.  This had progressively been getting worse -We will hold further anticoagulation -CT chest shows pneumonia versus pulmonary hemorrhage -Discussed with Dr. Halford Chessman, recommended continuing to treat pneumonia and reverse INR.  If hemoptysis persists, may need further investigation  Mild acute on chronic hypoxemic respiratory failure -Patient did require 3 L and baseline 2 L at home -He was able to wean down to his baseline oxygen requirement and was able to ambulate  Supratherapeutic INR-resolved -Appreciate pharmacy management -Given vitamin K on 1/3, 1/7  Troponin elevation likely secondary to demand ischemia -No chest pain or findings of ACS on EKG  Chronic diastolic congestive heart failure -Patient does have some lower extremity edema. -Follow daily weights -Repeat 2D echocardiogram with noted LVEF 50-55% -Continue home dose of Lasix  Hypertension-stable -Continue metoprolol as well as Cardizem and  monitor carefully  Type 2 diabetes -A1c 5.8 -Januvia held on  admission, but this can be resumed on discharge -Blood sugars stable -Treated with SSI while in the hospital  Chronic COPD on home 2 L nasal cannula oxygen -Patient reports that normally, he is functional is able to ambulate without difficulty -during his hospital stay he had shortness of breath and wheezing -He was treated with bronchodilators, steroids and mucomyst nebs -respiratory status appears to be approaching his baseline. -he will be discharged with a prednisone taper  Discharge Instructions  Discharge Instructions    Diet - low sodium heart healthy   Complete by: As directed    Increase activity slowly   Complete by: As directed      Allergies as of 10/28/2019   No Known Allergies     Medication List    STOP taking these medications   diltiazem 240 MG 24 hr capsule Commonly known as: TIAZAC   diltiazem 360 MG 24 hr capsule Commonly known as: TIAZAC   hydrALAZINE 10 MG tablet Commonly known as: APRESOLINE   lisinopril 10 MG tablet Commonly known as: ZESTRIL     TAKE these medications   albuterol 108 (90 Base) MCG/ACT inhaler Commonly known as: VENTOLIN HFA Inhale into the lungs.   atorvastatin 10 MG tablet Commonly known as: LIPITOR Take 10 mg by mouth daily.   Cholecalciferol 25 MCG (1000 UT) tablet Take by mouth.   Colcrys 0.6 MG tablet Generic drug: colchicine Take 0.6 mg by mouth 2 (two) times daily.   diltiazem 60 MG tablet Commonly known as: CARDIZEM Take 1 tablet (60 mg total) by mouth 2 (two) times daily.   Fish Oil 1000 MG Caps Take 1 capsule by mouth daily.   furosemide 20 MG tablet Commonly known as: LASIX Take 20 mg by mouth daily.   glimepiride 1 MG tablet Commonly known as: AMARYL Take 1 mg by mouth daily.   guaiFENesin 600 MG 12 hr tablet Commonly known as: MUCINEX Take 2 tablets (1,200 mg total) by mouth 2 (two) times daily.   ipratropium-albuterol  0.5-2.5 (3) MG/3ML Soln Commonly known as: DUONEB Inhale 3 mLs into the lungs every 6 (six) hours as needed (breathing).   isosorbide mononitrate 30 MG 24 hr tablet Commonly known as: IMDUR Take 30 mg by mouth daily.   MENS ONE DAILY PO Take 1 tablet by mouth daily.   methocarbamol 500 MG tablet Commonly known as: ROBAXIN Take 500 mg by mouth 4 (four) times daily.   metoprolol tartrate 50 MG tablet Commonly known as: LOPRESSOR Take 50 mg by mouth 2 (two) times daily.   mirtazapine 15 MG tablet Commonly known as: REMERON Take 15 mg by mouth at bedtime.   pantoprazole 40 MG tablet Commonly known as: PROTONIX Take 1 tablet (40 mg total) by mouth daily.   predniSONE 20 MG tablet Commonly known as: DELTASONE Take 40mg  po daily for 2 days then 30mg  daily for 2 days then 20mg  daily for 2 days then 10mg  daily for 2 days then stop   sitaGLIPtin 50 MG tablet Commonly known as: JANUVIA Take 50 mg by mouth daily.   sodium bicarbonate 650 MG tablet Take 650 mg by mouth 4 (four) times daily.   Spiriva HandiHaler 18 MCG inhalation capsule Generic drug: tiotropium 1 capsule daily.   Symbicort 160-4.5 MCG/ACT inhaler Generic drug: budesonide-formoterol Inhale 1 puff into the lungs daily.   warfarin 4 MG tablet Commonly known as: COUMADIN Take 4 mg by mouth See admin instructions. Take 1 tablet daily.  Take along with 2 mg tablet  on Wed, Thurs, Friday, and Saturday for a total of 6 mg.   warfarin 2 MG tablet Commonly known as: COUMADIN Take 2 mg by mouth See admin instructions. Take 2 mg tablet every Wed, Thurs, Friday, and Saturday along with 4 mg for a total of 6 mg      Follow-up Information    Fields, Marga Melnick, MD Follow up.   Specialty: Gastroenterology Why: call for appointment in 2 weeks Contact information: [place] [address] [phone]        Neale Burly, MD Follow up.   Specialty: Internal Medicine Why: Repeat CT  chest in 3-4  weeks Contact information: [place] [phone]          No Known Allergies  Consultations:  GI   Procedures/Studies: CT CHEST WO CONTRAST  Result Date: 10/22/2019 CLINICAL DATA:  Cough with hemoptysis. Shortness of breath. Wheezing. EXAM: CT CHEST WITHOUT CONTRAST TECHNIQUE: Multidetector CT imaging of the chest was performed following the standard protocol without IV contrast. COMPARISON:  CT abdomen pelvis 10/19/2019. Images from CT chest 08/22/2013 are not available at the time of dictation. FINDINGS: Cardiovascular: Atherosclerotic calcification of aorta, aortic valve and coronary arteries. Heart is enlarged. No pericardial effusion. Mediastinum/Nodes: No pathologically enlarged mediastinal or axillary lymph nodes. Hilar regions are difficult to definitively evaluate without IV contrast. Esophagus is grossly unremarkable. Lungs/Pleura: Severe centrilobular emphysema. Image quality is degraded by respiratory motion. Rounded areas of airspace consolidation, probable fluid and ground-glass in the apical aspects of both upper lobes. Patchy ground-glass in the anterior segment right upper lobe and right middle lobe. New small bilateral pleural effusions. Minimal associated compressive atelectasis in both lower lobes. Debris is seen in the mainstem bronchi, bronchus intermedius and right lower lobe bronchi. Airway is otherwise unremarkable. Upper Abdomen: Visualized portions the liver, adrenal glands, kidneys, spleen, pancreas, stomach and bowel are grossly unremarkable. Calcified upper abdominal lymph nodes. Trace perihepatic fluid. Musculoskeletal: No worrisome lytic or sclerotic lesions. IMPRESSION: 1. Areas of consolidation, probable fluid and ground-glass in the upper lobes and right middle lobe, findings which may represent a combination pneumonia and pulmonary hemorrhage. As it is difficult to exclude a true pulmonary nodule in the upper lobes, follow-up CT  chest without contrast in 3-4 weeks is recommended in further evaluation to exclude malignancy. 2. Small bilateral pleural effusions, new from 10/19/2019. 3. Aortic atherosclerosis (ICD10-I70.0). Coronary artery calcification. 4.  Emphysema (ICD10-J43.9). Electronically Signed   By: Lorin Picket M.D.   On: 10/22/2019 16:48   CT ABDOMEN PELVIS W CONTRAST  Result Date: 10/19/2019 CLINICAL DATA:  Abdominal pain. Nausea and vomiting. Abdominal abscess/infection suspected EXAM: CT ABDOMEN AND PELVIS WITH CONTRAST TECHNIQUE: Multidetector CT imaging of the abdomen and pelvis was performed using the standard protocol following bolus administration of intravenous contrast. CONTRAST:  169mL OMNIPAQUE IOHEXOL 300 MG/ML  SOLN COMPARISON:  Chest radiograph yesterday. Report from abdominopelvic CT 12/24/2015, images not available FINDINGS: Lower chest: Emphysema. Ground-glass opacities in the right middle lobe are partially included. Multi chamber cardiomegaly with primarily right heart dilatation. Contrast refluxes into the hepatic veins and IVC. Hepatobiliary: Stones and sludge in the gallbladder without gallbladder inflammation. No biliary dilatation. Tiny subcentimeter hypodensity in the subcapsular right lobe of the liver is too small to characterize. Contrast refluxes into the hepatic veins and IVC. Pancreas: No ductal dilatation or inflammation. Spleen: Heterogeneous enhancement likely due to phase of contrast. Spleen is normal in size. Adrenals/Urinary Tract: Normal adrenal glands.  No hydronephrosis. No perinephric edema. Small cyst in the posterior left kidney. Urinary bladder is markedly distended to the umbilicus. Bladder volume = 1500 cm^3. Bladder slightly protrudes into the right obturator fossa. No bladder wall thickening. Questionable diverticulum at the bladder dome. Stomach/Bowel: Fluid-filled stomach, with minimal fluid in the distal esophagus. There is no gastric wall thickening. Wall thickening with  mucosal enhancement involving the fourth portion of the duodenum, and more prominently the proximal jejunum. Moderate length segment of small bowel involvement. No obstruction. There is associated mesenteric edema and free fluid. No pneumatosis or perforation. More distal small bowel are nondistended, slightly fluid-filled but noninflamed. There is small bowel interposition lateral to the right lobe of the liver. Colonic diverticulosis without diverticulitis. The appendix is not well visualized. There is no evidence of appendicitis. Vascular/Lymphatic: Moderate aortic atherosclerosis. No aortic aneurysm. Portal vein is patent. The mesenteric artery and veins are patent. No gross adenopathy. Reproductive: Prostate is unremarkable. Other: Small to moderate volume of free fluid/ascites. Moderate mesenteric edema and free fluid. No free air. No intra-abdominal abscess. Musculoskeletal: Degenerative change in the spine. Avascular necrosis of the femoral heads without collapse. IMPRESSION: 1. Mark wall thickening with mucosal enhancement involving the fourth portion of the duodenum and proximal jejunum, with associated mesenteric edema and free fluid. Findings may represent infectious or inflammatory enteritis versus bowel angioedema. No evidence of bowel ischemia or perforation. 2. Markedly distended urinary bladder to the umbilicus. Questionable bladder dome diverticulum. Similar findings were described on prior exam, this may be chronic. 3. Gallstones and sludge in the gallbladder without gallbladder inflammation. 4. Colonic diverticulosis without diverticulitis. 5. Multi chamber cardiomegaly with primarily right heart dilatation, and contrast refluxing into the hepatic veins and IVC consistent with right heart failure. Aortic Atherosclerosis (ICD10-I70.0) and Emphysema (ICD10-J43.9). Electronically Signed   By: Keith Rake M.D.   On: 10/19/2019 03:01   DG CHEST PORT 1 VIEW  Result Date: 10/27/2019 CLINICAL  DATA:  Shortness of breath EXAM: PORTABLE CHEST 1 VIEW COMPARISON:  October 22, 2019 FINDINGS: The heart size and mediastinal contours are unchanged. Aortic knob calcifications. Slight interval improvement in aeration and opacities throughout both lungs. Again noted is diffusely increased interstitial markings and patchy airspace opacity within the left mid lung. Hyperinflation of both lung zones is seen. No acute osseous abnormality. IMPRESSION: Slight interval improvement in hazy/patchy airspace opacity and aeration of both lungs. Electronically Signed   By: Prudencio Pair M.D.   On: 10/27/2019 05:58   DG Chest Portable 1 View  Result Date: 10/19/2019 CLINICAL DATA:  Shortness of breath. Nausea and vomiting. EXAM: PORTABLE CHEST 1 VIEW COMPARISON:  Radiograph 07/10/2018. FINDINGS: Patchy heterogeneous airspace opacities throughout the right right upper and midlung zones. Possible minimal fluid in the right minor fissure. No definite airspace disease in the left lung. Mild cardiomegaly is unchanged. Mild aortic atherosclerosis. No pneumothorax. Chronic hyperinflation. No acute osseous abnormalities are seen. IMPRESSION: 1. Patchy heterogeneous airspace opacities in the right upper and midlung zones, suspicious for pneumonia. Unilateral involvement more typical of broncho pneumonia rather than COVID. 2. Stable mild cardiomegaly and hyperinflation. Electronically Signed   By: Keith Rake M.D.   On: 10/19/2019 00:23   ECHOCARDIOGRAM COMPLETE  Result Date: 10/19/2019   ECHOCARDIOGRAM REPORT   Patient Name:   ROYLEE CHAFFIN Date of Exam: 10/19/2019 Medical Rec #:  [id number]    Height:       71.0 in Accession #:    [id number]   Weight:  124.1 lb Date of Birth:  03-11-1946   BSA:          1.72 m Patient Age:    55 years     BP:           152/84 mmHg Patient Gender: M            HR:           112 bpm. Exam Location:  [facility] Procedure: 2D Echo Indications:    atrial fibrillation  History:        Patient has no  prior history of Echocardiogram examinations.                 CHF, COPD, Arrythmias:Atrial Fibrillation; Risk                 Factors:Diabetes, Hypertension and Former Smoker.  Sonographer:    Jannett Celestine RDCS (AE) Referring Phys: [place]  1. Left ventricular ejection fraction, by visual estimation, is 50 to 55%. The left ventricle has low normal function. There is no left ventricular hypertrophy.  2. Left ventricular diastolic parameters are indeterminate.  3. Right ventricular volume and pressure overload.  4. The left ventricle has no regional wall motion abnormalities.  5. Global right ventricle has mildly to moderately reduced systolic function.The right ventricular size is severely enlarged. No increase in right ventricular wall thickness.  6. Left atrial size was normal.  7. Right atrial size was severely dilated.  8. The mitral valve is grossly normal. Moderate mitral valve regurgitation.  9. The tricuspid valve is grossly normal. 10. The aortic valve is tricuspid. Aortic valve regurgitation is not visualized. Mild aortic valve sclerosis without stenosis. 11. The pulmonic valve was not well visualized. Pulmonic valve regurgitation is trivial. 12. Severely elevated pulmonary artery systolic pressure. 13. The tricuspid regurgitant velocity is 4.03 m/s, and with an assumed right atrial pressure of 3 mmHg, the estimated right ventricular systolic pressure is severely elevated at 68.0 mmHg. 14. The inferior vena cava is normal in size with greater than 50% respiratory variability, suggesting right atrial pressure of 3 mmHg. FINDINGS  Left Ventricle: Left ventricular ejection fraction, by visual estimation, is 50 to 55%. The left ventricle has low normal function. The left ventricle has no regional wall motion abnormalities. The left ventricular internal cavity size was the left ventricle is normal in size. There is no left ventricular hypertrophy. The interventricular septum is  flattened in systole and diastole, consistent with right ventricular pressure and volume overload. Left ventricular diastolic parameters are indeterminate. Right Ventricle: The right ventricular size is severely enlarged. No increase in right ventricular wall thickness. Global RV systolic function is has mildly reduced systolic function. The tricuspid regurgitant velocity is 4.03 m/s, and with an assumed right atrial pressure of 3 mmHg, the estimated right ventricular systolic pressure is severely elevated at 68.0 mmHg. Left Atrium: Left atrial size was normal in size. Right Atrium: Right atrial size was severely dilated Pericardium: There is no evidence of pericardial effusion. Mitral Valve: The mitral valve is grossly normal. Moderate mitral valve regurgitation. Tricuspid Valve: The tricuspid valve is grossly normal. Tricuspid valve regurgitation moderate-severe. Aortic Valve: The aortic valve is tricuspid. Aortic valve regurgitation is not visualized. Mild aortic valve sclerosis is present, with no evidence of aortic valve stenosis. Pulmonic Valve: The pulmonic valve was not well visualized. Pulmonic valve regurgitation is trivial. Pulmonic regurgitation is trivial. Aorta: The aortic root is normal in size and structure. Venous: The  inferior vena cava is normal in size with greater than 50% respiratory variability, suggesting right atrial pressure of 3 mmHg. IAS/Shunts: No atrial level shunt detected by color flow Doppler.  LEFT VENTRICLE PLAX 2D LVIDd:         3.56 cm LVIDs:         2.66 cm LV PW:         0.98 cm LV IVS:        0.78 cm LVOT diam:     2.40 cm LV SV:         27 ml LV SV Index:   16.22 LVOT Area:     4.52 cm  RIGHT VENTRICLE RV S prime:     17.30 cm/s TAPSE (M-mode): 1.5 cm LEFT ATRIUM           Index       RIGHT ATRIUM           Index LA diam:      3.80 cm 2.21 cm/m  RA Area:     19.00 cm LA Vol (A4C): 32.8 ml 19.05 ml/m RA Volume:   50.00 ml  29.04 ml/m  AORTIC VALVE LVOT Vmax:   65.00 cm/s  LVOT Vmean:  51.100 cm/s LVOT VTI:    0.143 m  AORTA Ao Root diam: 2.70 cm MITRAL VALVE                       TRICUSPID VALVE MV Area (PHT): 2.76 cm            TR Peak grad:   65.0 mmHg MV PHT:        79.75 msec          TR Vmax:        403.00 cm/s MV Decel Time: 275 msec MV E velocity: 60.60 cm/s 103 cm/s SHUNTS                                    Systemic VTI:  0.14 m                                    Systemic Diam: 2.40 cm  Rozann Lesches MD Electronically signed by Rozann Lesches MD Signature Date/Time: 10/19/2019/12:58:01 PM    Final        Subjective: Feeling better. Still has cough. Shortness of breath improving. Able to ambulate  Discharge Exam: Vitals:   10/28/19 0901 10/28/19 0911 10/28/19 1314 10/28/19 1430  BP:   129/78   Pulse:   (!) 102   Resp:   20   Temp:   98.3 F (36.8 C)   TempSrc:   Oral   SpO2: 98% 100% 98% 97%  Weight:      Height:        General: Pt is alert, awake, not in acute distress Cardiovascular: RRR, S1/S2 +, no rubs, no gallops Respiratory: diminished breath sounds bilaterally Abdominal: Soft, NT, ND, bowel sounds + Extremities: 1+ edema, no cyanosis    The results of significant diagnostics from this hospitalization (including imaging, microbiology, ancillary and laboratory) are listed below for reference.     Microbiology: Recent Results (from the past 240 hour(s))  Expectorated sputum assessment w rflx to resp cult     Status: None   Collection Time: 10/24/19  1:37 PM   Specimen:  Expectorated Sputum  Result Value Ref Range Status   Specimen Description EXPECTORATED SPUTUM  Final   Special Requests NONE  Final   Sputum evaluation   Final    THIS SPECIMEN IS ACCEPTABLE FOR SPUTUM CULTURE Performed at [facility], [address], [place], [place] [address]    Report Status 10/24/2019 FINAL  Final  Culture, respiratory     Status: None   Collection Time: 10/24/19  1:37 PM  Result Value Ref Range Status   Specimen Description   Final     EXPECTORATED SPUTUM Performed at [facility], [address], [place], [place] [address]    Special Requests   Final    NONE Reflexed from [id number] Performed at [facility], [address], [place], [place] [address]    Gram Stain   Final    ABUNDANT WBC PRESENT, PREDOMINANTLY PMN FEW SQUAMOUS EPITHELIAL CELLS PRESENT ABUNDANT YEAST Performed at [facility], [place] [address], [place], [place] [address]    Culture MODERATE CANDIDA ALBICANS  Final   Report Status 10/26/2019 FINAL  Final     Labs: BNP (last 3 results) Recent Labs    10/18/19 2319  BNP 546.2*   Basic Metabolic Panel: Recent Labs  Lab 10/25/19 0623 10/26/19 0617 10/27/19 0629  NA 141 141 144  K 4.0 4.2 3.8  CL 108 106 105  CO2 24 25 27   GLUCOSE 128* 182* 164*  BUN 36* 32* 41*  CREATININE 1.61* 1.47* 1.45*  CALCIUM 8.1* 8.0* 8.3*   Liver Function Tests: No results for input(s): AST, ALT, ALKPHOS, BILITOT, PROT, ALBUMIN in the last 168 hours. No results for input(s): LIPASE, AMYLASE in the last 168 hours. No results for input(s): AMMONIA in the last 168 hours. CBC: Recent Labs  Lab 10/25/19 0623 10/26/19 0617 10/27/19 0629  WBC 12.4* 14.7* 13.6*  HGB 8.0* 8.6* 8.8*  HCT 25.4* 27.5* 28.7*  MCV 102.8* 105.4* 104.7*  PLT 349 387 378   Cardiac Enzymes: No results for input(s): CKTOTAL, CKMB, CKMBINDEX, TROPONINI in the last 168 hours. BNP: Invalid input(s): POCBNP CBG: Recent Labs  Lab 10/27/19 1635 10/27/19 2122 10/28/19 0710 10/28/19 1122 10/28/19 1628  GLUCAP 230* 174* 155* 176* 125*   D-Dimer No results for input(s): DDIMER in the last 72 hours. Hgb A1c No results for input(s): HGBA1C in the last 72 hours. Lipid Profile No results for input(s): CHOL, HDL, LDLCALC, TRIG, CHOLHDL, LDLDIRECT in the last 72 hours. Thyroid function studies No results for input(s): TSH, T4TOTAL, T3FREE, THYROIDAB in the last 72 hours.  Invalid input(s): FREET3 Anemia work up No results for  input(s): VITAMINB12, FOLATE, FERRITIN, TIBC, IRON, RETICCTPCT in the last 72 hours. Urinalysis    Component Value Date/Time   COLORURINE YELLOW 10/19/2019 0259   APPEARANCEUR CLEAR 10/19/2019 0259   LABSPEC 1.018 10/19/2019 0259   PHURINE 6.0 10/19/2019 0259   GLUCOSEU NEGATIVE 10/19/2019 0259   HGBUR NEGATIVE 10/19/2019 0259   BILIRUBINUR NEGATIVE 10/19/2019 0259   KETONESUR NEGATIVE 10/19/2019 0259   PROTEINUR 30 (A) 10/19/2019 0259   NITRITE NEGATIVE 10/19/2019 0259   LEUKOCYTESUR NEGATIVE 10/19/2019 0259   Sepsis Labs Invalid input(s): PROCALCITONIN,  WBC,  LACTICIDVEN Microbiology Recent Results (from the past 240 hour(s))  Expectorated sputum assessment w rflx to resp cult     Status: None   Collection Time: 10/24/19  1:37 PM   Specimen: Expectorated Sputum  Result Value Ref Range Status   Specimen Description EXPECTORATED SPUTUM  Final   Special Requests  NONE  Final   Sputum evaluation   Final    THIS SPECIMEN IS ACCEPTABLE FOR SPUTUM CULTURE Performed at [facility], [address], [place], [place] [address]    Report Status 10/24/2019 FINAL  Final  Culture, respiratory     Status: None   Collection Time: 10/24/19  1:37 PM  Result Value Ref Range Status   Specimen Description   Final    EXPECTORATED SPUTUM Performed at [facility], [address], [place], [place] [address]    Special Requests   Final    NONE Reflexed from [id number] Performed at [facility], [address], [place], [place] [address]    Gram Stain   Final    ABUNDANT WBC PRESENT, PREDOMINANTLY PMN FEW SQUAMOUS EPITHELIAL CELLS PRESENT ABUNDANT YEAST Performed at [facility], [place] [address], [place], [place] [address]    Culture MODERATE CANDIDA ALBICANS  Final   Report Status 10/26/2019 FINAL  Final     Time coordinating discharge: 66mins  SIGNED:   Kathie Dike, MD  [facility] 10/31/2019, 2:48 PM   If 7PM-7AM, please contact night-coverage [email or web address]

## 2019-11-07 ENCOUNTER — Ambulatory Visit: Payer: Medicare HMO | Admitting: Gastroenterology

## 2019-11-07 ENCOUNTER — Encounter: Payer: Self-pay | Admitting: Gastroenterology

## 2019-11-07 ENCOUNTER — Telehealth: Payer: Self-pay | Admitting: Gastroenterology

## 2019-11-07 NOTE — Telephone Encounter (Signed)
PATIENT WAS A NO SHOW AND LETTER SENT  °

## 2019-11-13 DIAGNOSIS — I5042 Chronic combined systolic (congestive) and diastolic (congestive) heart failure: Secondary | ICD-10-CM | POA: Diagnosis not present

## 2019-11-13 DIAGNOSIS — Z681 Body mass index (BMI) 19 or less, adult: Secondary | ICD-10-CM | POA: Diagnosis not present

## 2019-11-13 DIAGNOSIS — J1281 Pneumonia due to SARS-associated coronavirus: Secondary | ICD-10-CM | POA: Diagnosis not present

## 2019-11-27 ENCOUNTER — Ambulatory Visit: Payer: Medicare HMO

## 2019-12-01 DIAGNOSIS — R918 Other nonspecific abnormal finding of lung field: Secondary | ICD-10-CM | POA: Diagnosis not present

## 2019-12-01 DIAGNOSIS — N189 Chronic kidney disease, unspecified: Secondary | ICD-10-CM | POA: Diagnosis not present

## 2019-12-01 DIAGNOSIS — Z452 Encounter for adjustment and management of vascular access device: Secondary | ICD-10-CM | POA: Diagnosis not present

## 2019-12-01 DIAGNOSIS — R52 Pain, unspecified: Secondary | ICD-10-CM | POA: Diagnosis not present

## 2019-12-01 DIAGNOSIS — R1032 Left lower quadrant pain: Secondary | ICD-10-CM | POA: Diagnosis not present

## 2019-12-01 DIAGNOSIS — D62 Acute posthemorrhagic anemia: Secondary | ICD-10-CM | POA: Diagnosis not present

## 2019-12-01 DIAGNOSIS — I130 Hypertensive heart and chronic kidney disease with heart failure and stage 1 through stage 4 chronic kidney disease, or unspecified chronic kidney disease: Secondary | ICD-10-CM | POA: Diagnosis not present

## 2019-12-01 DIAGNOSIS — N3289 Other specified disorders of bladder: Secondary | ICD-10-CM | POA: Diagnosis not present

## 2019-12-01 DIAGNOSIS — J189 Pneumonia, unspecified organism: Secondary | ICD-10-CM | POA: Diagnosis not present

## 2019-12-01 DIAGNOSIS — J181 Lobar pneumonia, unspecified organism: Secondary | ICD-10-CM | POA: Diagnosis not present

## 2019-12-01 DIAGNOSIS — R778 Other specified abnormalities of plasma proteins: Secondary | ICD-10-CM | POA: Diagnosis not present

## 2019-12-01 DIAGNOSIS — R1084 Generalized abdominal pain: Secondary | ICD-10-CM | POA: Diagnosis not present

## 2019-12-01 DIAGNOSIS — I50810 Right heart failure, unspecified: Secondary | ICD-10-CM | POA: Diagnosis not present

## 2019-12-01 DIAGNOSIS — J449 Chronic obstructive pulmonary disease, unspecified: Secondary | ICD-10-CM | POA: Diagnosis not present

## 2019-12-01 DIAGNOSIS — K529 Noninfective gastroenteritis and colitis, unspecified: Secondary | ICD-10-CM | POA: Diagnosis not present

## 2019-12-01 DIAGNOSIS — I482 Chronic atrial fibrillation, unspecified: Secondary | ICD-10-CM | POA: Diagnosis not present

## 2019-12-01 DIAGNOSIS — R109 Unspecified abdominal pain: Secondary | ICD-10-CM | POA: Diagnosis not present

## 2019-12-01 DIAGNOSIS — I499 Cardiac arrhythmia, unspecified: Secondary | ICD-10-CM | POA: Diagnosis not present

## 2019-12-01 DIAGNOSIS — E440 Moderate protein-calorie malnutrition: Secondary | ICD-10-CM | POA: Diagnosis not present

## 2019-12-01 DIAGNOSIS — T45511A Poisoning by anticoagulants, accidental (unintentional), initial encounter: Secondary | ICD-10-CM | POA: Diagnosis not present

## 2019-12-01 DIAGNOSIS — R791 Abnormal coagulation profile: Secondary | ICD-10-CM | POA: Diagnosis not present

## 2019-12-01 DIAGNOSIS — R188 Other ascites: Secondary | ICD-10-CM | POA: Diagnosis not present

## 2019-12-01 DIAGNOSIS — Z681 Body mass index (BMI) 19 or less, adult: Secondary | ICD-10-CM | POA: Diagnosis not present

## 2019-12-01 DIAGNOSIS — K8020 Calculus of gallbladder without cholecystitis without obstruction: Secondary | ICD-10-CM | POA: Diagnosis not present

## 2019-12-01 DIAGNOSIS — I4891 Unspecified atrial fibrillation: Secondary | ICD-10-CM | POA: Diagnosis not present

## 2019-12-01 DIAGNOSIS — D631 Anemia in chronic kidney disease: Secondary | ICD-10-CM | POA: Diagnosis not present

## 2019-12-01 DIAGNOSIS — K56600 Partial intestinal obstruction, unspecified as to cause: Secondary | ICD-10-CM | POA: Diagnosis not present

## 2019-12-01 DIAGNOSIS — A419 Sepsis, unspecified organism: Secondary | ICD-10-CM | POA: Diagnosis not present

## 2019-12-01 DIAGNOSIS — I50812 Chronic right heart failure: Secondary | ICD-10-CM | POA: Diagnosis not present

## 2019-12-01 DIAGNOSIS — K5730 Diverticulosis of large intestine without perforation or abscess without bleeding: Secondary | ICD-10-CM | POA: Diagnosis not present

## 2019-12-11 DIAGNOSIS — Z7901 Long term (current) use of anticoagulants: Secondary | ICD-10-CM | POA: Diagnosis not present

## 2019-12-11 DIAGNOSIS — K529 Noninfective gastroenteritis and colitis, unspecified: Secondary | ICD-10-CM | POA: Diagnosis not present

## 2019-12-11 DIAGNOSIS — J1281 Pneumonia due to SARS-associated coronavirus: Secondary | ICD-10-CM | POA: Diagnosis not present

## 2019-12-11 DIAGNOSIS — Z681 Body mass index (BMI) 19 or less, adult: Secondary | ICD-10-CM | POA: Diagnosis not present

## 2019-12-19 DIAGNOSIS — E1122 Type 2 diabetes mellitus with diabetic chronic kidney disease: Secondary | ICD-10-CM | POA: Diagnosis not present

## 2019-12-19 DIAGNOSIS — I130 Hypertensive heart and chronic kidney disease with heart failure and stage 1 through stage 4 chronic kidney disease, or unspecified chronic kidney disease: Secondary | ICD-10-CM | POA: Diagnosis not present

## 2019-12-19 DIAGNOSIS — I5020 Unspecified systolic (congestive) heart failure: Secondary | ICD-10-CM | POA: Diagnosis not present

## 2019-12-19 DIAGNOSIS — N183 Chronic kidney disease, stage 3 unspecified: Secondary | ICD-10-CM | POA: Diagnosis not present

## 2019-12-19 DIAGNOSIS — I5032 Chronic diastolic (congestive) heart failure: Secondary | ICD-10-CM | POA: Diagnosis not present

## 2019-12-19 DIAGNOSIS — D631 Anemia in chronic kidney disease: Secondary | ICD-10-CM | POA: Diagnosis not present

## 2019-12-19 DIAGNOSIS — N323 Diverticulum of bladder: Secondary | ICD-10-CM | POA: Diagnosis not present

## 2019-12-19 DIAGNOSIS — Z466 Encounter for fitting and adjustment of urinary device: Secondary | ICD-10-CM | POA: Diagnosis not present

## 2019-12-19 DIAGNOSIS — R339 Retention of urine, unspecified: Secondary | ICD-10-CM | POA: Diagnosis not present

## 2019-12-22 DIAGNOSIS — T8383XA Hemorrhage of genitourinary prosthetic devices, implants and grafts, initial encounter: Secondary | ICD-10-CM | POA: Diagnosis not present

## 2019-12-22 DIAGNOSIS — E1140 Type 2 diabetes mellitus with diabetic neuropathy, unspecified: Secondary | ICD-10-CM | POA: Diagnosis not present

## 2019-12-22 DIAGNOSIS — I482 Chronic atrial fibrillation, unspecified: Secondary | ICD-10-CM | POA: Diagnosis not present

## 2019-12-22 DIAGNOSIS — E1122 Type 2 diabetes mellitus with diabetic chronic kidney disease: Secondary | ICD-10-CM | POA: Diagnosis not present

## 2019-12-22 DIAGNOSIS — Z7984 Long term (current) use of oral hypoglycemic drugs: Secondary | ICD-10-CM | POA: Diagnosis not present

## 2019-12-22 DIAGNOSIS — R319 Hematuria, unspecified: Secondary | ICD-10-CM | POA: Diagnosis not present

## 2019-12-22 DIAGNOSIS — I509 Heart failure, unspecified: Secondary | ICD-10-CM | POA: Diagnosis not present

## 2019-12-22 DIAGNOSIS — I4891 Unspecified atrial fibrillation: Secondary | ICD-10-CM | POA: Diagnosis not present

## 2019-12-22 DIAGNOSIS — N183 Chronic kidney disease, stage 3 unspecified: Secondary | ICD-10-CM | POA: Diagnosis not present

## 2019-12-22 DIAGNOSIS — I130 Hypertensive heart and chronic kidney disease with heart failure and stage 1 through stage 4 chronic kidney disease, or unspecified chronic kidney disease: Secondary | ICD-10-CM | POA: Diagnosis not present

## 2019-12-22 DIAGNOSIS — Z7901 Long term (current) use of anticoagulants: Secondary | ICD-10-CM | POA: Diagnosis not present

## 2020-01-02 ENCOUNTER — Ambulatory Visit: Payer: Medicare HMO | Admitting: Urology

## 2020-01-09 ENCOUNTER — Other Ambulatory Visit: Payer: Self-pay

## 2020-01-09 ENCOUNTER — Encounter: Payer: Self-pay | Admitting: Urology

## 2020-01-09 ENCOUNTER — Ambulatory Visit: Payer: Medicare HMO | Admitting: Urology

## 2020-01-09 ENCOUNTER — Other Ambulatory Visit (HOSPITAL_COMMUNITY)
Admission: RE | Admit: 2020-01-09 | Discharge: 2020-01-09 | Disposition: A | Discharge status: Home / Self Care | Payer: Medicare HMO | Source: Other Acute Inpatient Hospital | Attending: Urology | Admitting: Urology

## 2020-01-09 VITALS — BP 126/90 | HR 88 | Temp 97.7°F | Ht 71.0 in | Wt 112.0 lb

## 2020-01-09 DIAGNOSIS — R339 Retention of urine, unspecified: Secondary | ICD-10-CM | POA: Diagnosis not present

## 2020-01-09 NOTE — Progress Notes (Signed)
Cath Change/ Replacement  Patient is present today for a catheter change due to urinary retention.  58ml of water was removed from the balloon, a 16FR foley cath was removed with out difficulty.  Patient was cleaned and prepped in a sterile fashion with betadine. A 16 FR foley cath was replaced into the bladder no complications were noted Urine return was noted 53ml and urine was lite red in color. The balloon was filled with 45ml of sterile water. A leg bag was attached for drainage.  A night bag was also given to the patient and patient was given instruction on how to change from one bag to another. Patient was given proper instruction on catheter care.  Urine culture was obtained.  Performed by: H. Jaleen Grupp LPN  Follow up: Per MD note

## 2020-01-09 NOTE — Progress Notes (Signed)
Subjective: 1. Urinary retention      Mr. Nosal is a 74 yo AAM who is sent by Dr. Stoney Bang for urinary retention found on a CT on 10/19/19 while he was at [facility] for CHF but I don't see that a foley was placed then.   He was admitted to [facility] and had another CT on  12/02/19 that showed persistent retention and a foley was placed and has not been changed since.  The bladder wall was somewhat thickened and there was a possible diverticulum on the dome.  His prostate is small.  He is on warfarin and has had some hematuria with the foley but not before.   He had frequency with lasix before his initial admission.  He would have urgency with UUI but no pain prior to his admission at [facility].  He has no other GU history but is a diabetic with neuropathy.     I have reviewed his [facility] records and CT's from [facility] and [facility].    His Cr was 1.45 on 10/27/19.  Hbg was 8.8.  His last INR from [facility] was about 15.  UA on 1/3 was clear.  ROS:  Review of Systems  Constitutional: Negative for chills and fever.  Respiratory: Positive for shortness of breath. Negative for cough.   Cardiovascular: Positive for leg swelling. Negative for chest pain and palpitations.  Gastrointestinal: Negative for constipation and diarrhea.  All other systems reviewed and are negative.   No Known Allergies  Past Medical History:  Diagnosis Date  . A-fib ([place])   . Anxiety   . Atrial fibrillation ([place])   . CHF (congestive heart failure) ([place])   . Chronic renal insufficiency   . COPD (chronic obstructive pulmonary disease) ([place])   . Diabetes ([place])   . Diabetes mellitus without complication ([place])   . Hypertension     Past Surgical History:  Procedure Laterality Date  . COLONOSCOPY WITH ESOPHAGOGASTRODUODENOSCOPY (EGD)     several years ago by Dr. Ladona Horns (2015??)    Social History   Socioeconomic History  . Marital status: Widowed    Spouse name: Not on file  . Number of children: 4  . Years of education: Not on file  .  Highest education level: Not on file  Occupational History  . Occupation: retired  Tobacco Use  . Smoking status: Former Smoker    Packs/day: 1.00    Years: 49.00    Pack years: 49.00    Quit date: 2016    Years since quitting: 5.2  . Smokeless tobacco: Never Used  Substance and Sexual Activity  . Alcohol use: Not Currently  . Drug use: Never  . Sexual activity: Not on file  Other Topics Concern  . Not on file  Social History Narrative  . Not on file   Social Determinants of Health   Financial Resource Strain:   . Difficulty of Paying Living Expenses:   Food Insecurity:   . Worried About Charity fundraiser in the Last Year:   . Arboriculturist in the Last Year:   Transportation Needs:   . Film/video editor (Medical):   Marland Kitchen Lack of Transportation (Non-Medical):   Physical Activity:   . Days of Exercise per Week:   . Minutes of Exercise per Session:   Stress:   . Feeling of Stress :   Social Connections:   . Frequency of Communication with Friends and Family:   . Frequency of Social Gatherings with Friends  and Family:   . Attends Religious Services:   . Active Member of Clubs or Organizations:   . Attends Archivist Meetings:   Marland Kitchen Marital Status:   Intimate Partner Violence:   . Fear of Current or Ex-Partner:   . Emotionally Abused:   Marland Kitchen Physically Abused:   . Sexually Abused:     Family History  Problem Relation Age of Onset  . Colon cancer Neg Hx   . Colon polyps Neg Hx     Anti-infectives: Anti-infectives (From admission, onward)   None      Current Outpatient Medications  Medication Sig Dispense Refill  . albuterol (VENTOLIN HFA) 108 (90 Base) MCG/ACT inhaler Inhale into the lungs.    Marland Kitchen atorvastatin (LIPITOR) 10 MG tablet Take 10 mg by mouth daily.    . Cholecalciferol 25 MCG (1000 UT) tablet Take by mouth.    . COLCRYS 0.6 MG tablet Take 0.6 mg by mouth 2 (two) times daily.    Marland Kitchen diltiazem (CARDIZEM) 60 MG tablet Take 1 tablet (60 mg  total) by mouth 2 (two) times daily. 60 tablet 0  . furosemide (LASIX) 20 MG tablet Take 20 mg by mouth daily.    Marland Kitchen glimepiride (AMARYL) 1 MG tablet Take 1 mg by mouth daily.    Marland Kitchen guaiFENesin (MUCINEX) 600 MG 12 hr tablet Take 2 tablets (1,200 mg total) by mouth 2 (two) times daily. 30 tablet 0  . ipratropium-albuterol (DUONEB) 0.5-2.5 (3) MG/3ML SOLN Inhale 3 mLs into the lungs every 6 (six) hours as needed (breathing).     . isosorbide mononitrate (IMDUR) 30 MG 24 hr tablet Take 30 mg by mouth daily.    . methocarbamol (ROBAXIN) 500 MG tablet Take 500 mg by mouth 4 (four) times daily.    . metoprolol tartrate (LOPRESSOR) 50 MG tablet Take 50 mg by mouth 2 (two) times daily.    . mirtazapine (REMERON) 15 MG tablet Take 15 mg by mouth at bedtime.     . Multiple Vitamins-Minerals (MENS ONE DAILY PO) Take 1 tablet by mouth daily.    . Omega-3 Fatty Acids (FISH OIL) 1000 MG CAPS Take 1 capsule by mouth daily.     . pantoprazole (PROTONIX) 40 MG tablet Take 1 tablet (40 mg total) by mouth daily. 30 tablet 0  . predniSONE (DELTASONE) 20 MG tablet Take 40mg  po daily for 2 days then 30mg  daily for 2 days then 20mg  daily for 2 days then 10mg  daily for 2 days then stop 20 tablet 0  . sitaGLIPtin (JANUVIA) 50 MG tablet Take 50 mg by mouth daily.    . sodium bicarbonate 650 MG tablet Take 650 mg by mouth 4 (four) times daily.    Marland Kitchen SPIRIVA HANDIHALER 18 MCG inhalation capsule 1 capsule daily.    . SYMBICORT 160-4.5 MCG/ACT inhaler Inhale 1 puff into the lungs daily.     Marland Kitchen warfarin (COUMADIN) 2 MG tablet Take 2 mg by mouth See admin instructions. Take 2 mg tablet every Wed, Thurs, Friday, and Saturday along with 4 mg for a total of 6 mg    . warfarin (COUMADIN) 4 MG tablet Take 4 mg by mouth See admin instructions. Take 1 tablet daily.  Take along with 2 mg tablet on Wed, Thurs, Friday, and Saturday for a total of 6 mg.     No current facility-administered medications for this visit.     Objective: BP  126/90   Pulse 88   Temp 97.7 F (36.5 C)  Ht 5\' 11"  (1.803 m)   Wt 112 lb (50.8 kg)   BMI 15.62 kg/m   Intake/Output from previous day: No intake/output data recorded. Intake/Output this shift: @IOTHISSHIFT @   Physical Exam Vitals reviewed.  Constitutional:      Appearance: Normal appearance.  HENT:     Head: Normocephalic and atraumatic.  Cardiovascular:     Rate and Rhythm: Normal rate. Rhythm irregular.  Pulmonary:     Effort: Pulmonary effort is normal. No respiratory distress.     Breath sounds: Normal breath sounds.  Abdominal:     General: Abdomen is flat.     Palpations: Abdomen is soft. There is no mass.     Hernia: No hernia is present.  Genitourinary:    Comments: Foley indwelling. AP normal. NST without mass. Prostate 1.5+ benign with non-palpable SV's.  Musculoskeletal:        General: No tenderness. Normal range of motion.     Cervical back: Normal range of motion and neck supple.     Right lower leg: Edema present.     Left lower leg: Edema present.  Lymphadenopathy:     Cervical: No cervical adenopathy.  Skin:    General: Skin is warm and dry.  Neurological:     General: No focal deficit present.     Mental Status: He is alert and oriented to person, place, and time.  Psychiatric:        Behavior: Behavior normal.     Lab Results:  No results found for this or any previous visit (from the past 24 hour(s)).  BMET No results for input(s): NA, K, CL, CO2, GLUCOSE, BUN, CREATININE, CALCIUM in the last 72 hours. PT/INR No results for input(s): LABPROT, INR in the last 72 hours. ABG No results for input(s): PHART, HCO3 in the last 72 hours.  Invalid input(s): PCO2, PO2  Studies/Results: No results found.   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  His prostate is small.  He had overflow symptoms prior to his admission in January.     His foley was changed today and a culture  was obtained.   He will be scheduled for urodynamics and then f/u with the results.     No orders of the defined types were placed in this encounter.    Orders Placed This Encounter  Procedures  . Urine Culture    Standing Status:   Future    Standing Expiration Date:   01/08/2021  . Ambulatory referral to Urology    Referral Priority:   Urgent    Referral Type:   Consultation    Referral Reason:   Specialty Services Required    Referred to Provider:   Irine Seal, MD    Requested Specialty:   Urology    Number of Visits Requested:   1  . Insert foley catheter     No follow-ups on file.    CC: Dr. Herbie Drape.      Irine Seal 01/09/2020 [phone]

## 2020-01-11 LAB — URINE CULTURE

## 2020-01-14 IMAGING — CT CT CHEST W/O CM
2 of 4 series · 15 of 36 positions shown, 18 images · non-contrast
Comparison: CT abdomen pelvis 10/19/2019. Images from CT chest
08/22/2013 are not available at the time of dictation.

CLINICAL DATA: Cough with hemoptysis. Shortness of breath.
Wheezing.

EXAM:
CT CHEST WITHOUT CONTRAST
TECHNIQUE: Multidetector CT imaging of the chest was performed following the
standard protocol without IV contrast.

[Series 2: routine chest without · axial · non-contrast · 0.71mm/px · z∈[+50,+356]mm · 12 of 181 slices shown, 15 images]
[im 14/181  mediastinal]
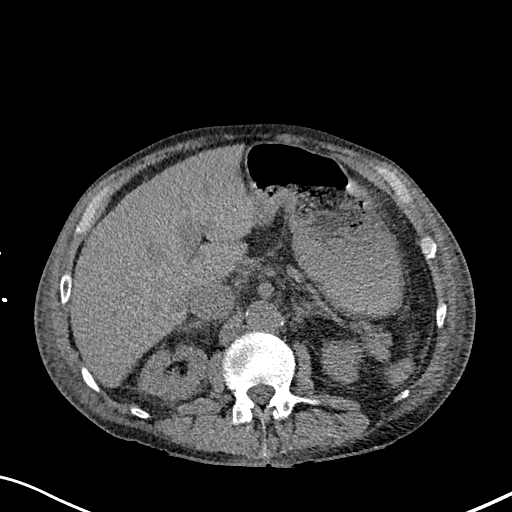
[im 14/181  lung]
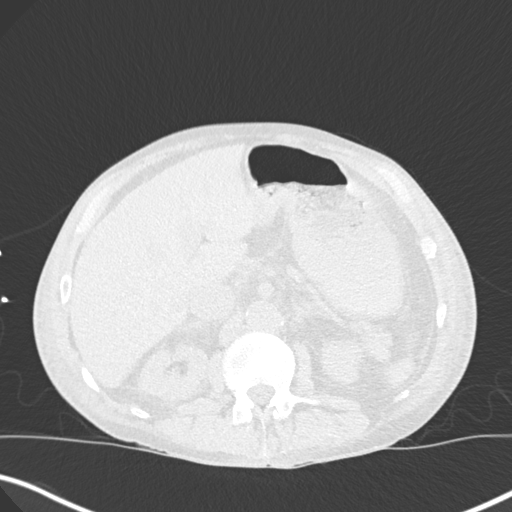
[im 28/181  lung]
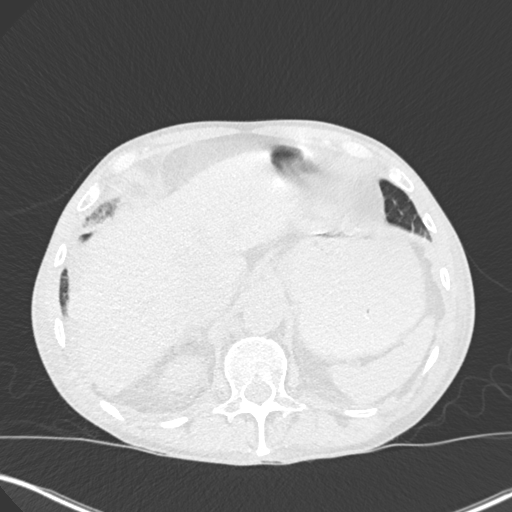
[im 42/181  lung]
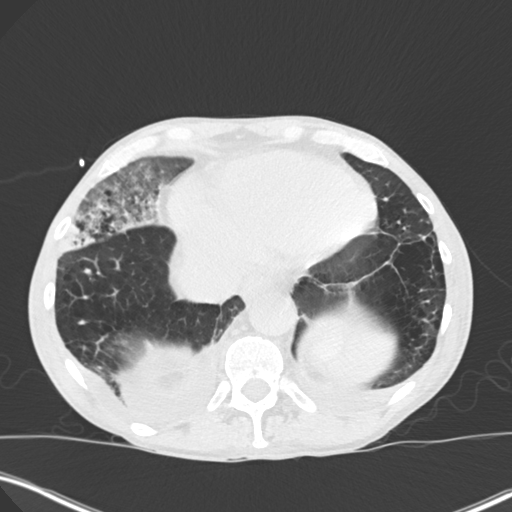
[im 56/181  lung]
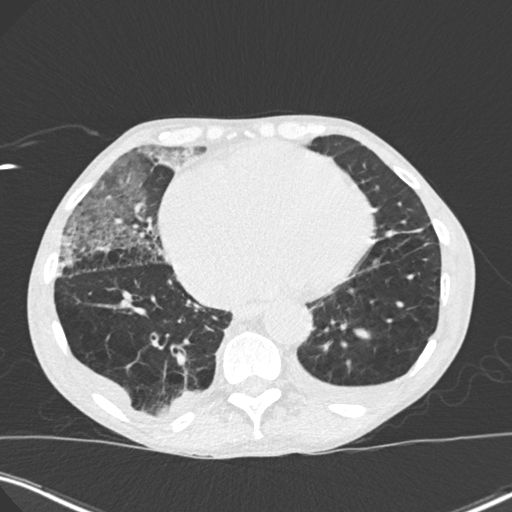
[im 70/181  mediastinal]
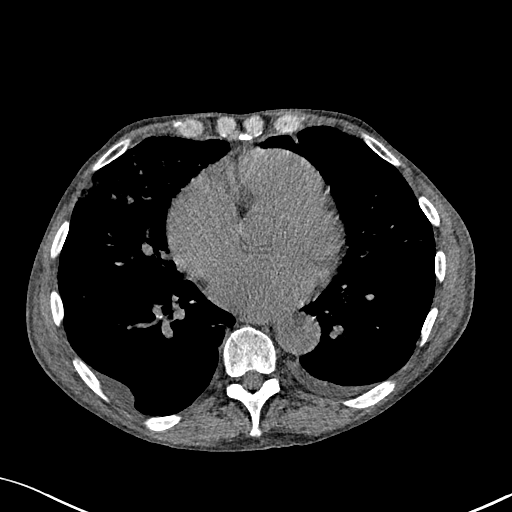
[im 70/181  lung]
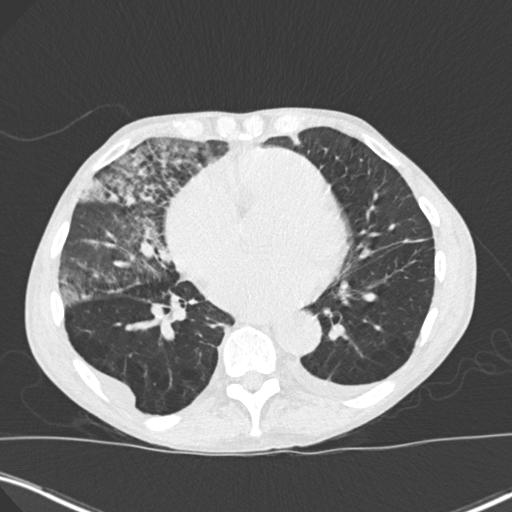
[im 84/181  lung]
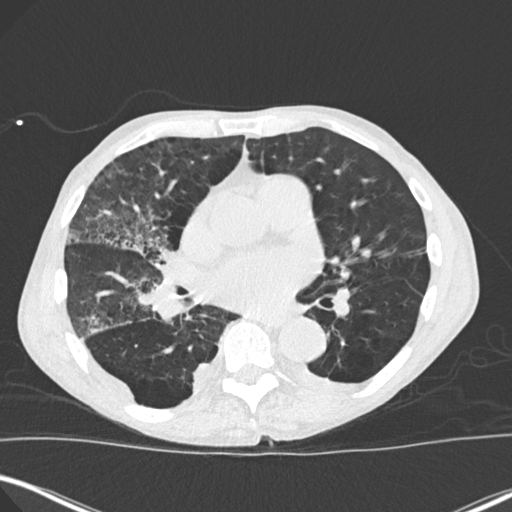
[im 97/181  lung]
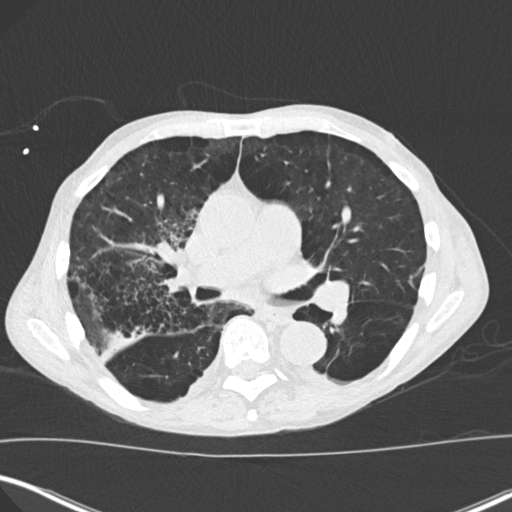
[im 111/181  lung]
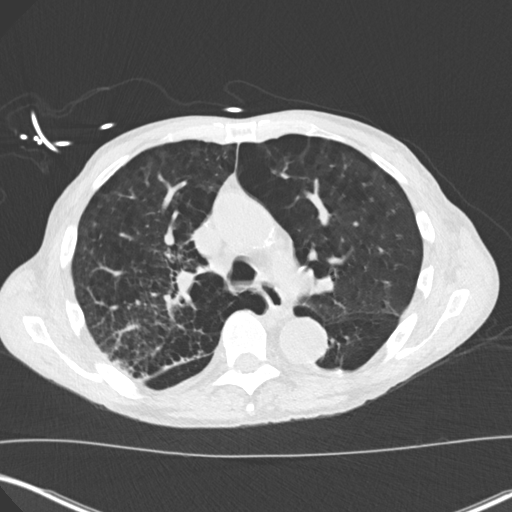
[im 125/181  mediastinal]
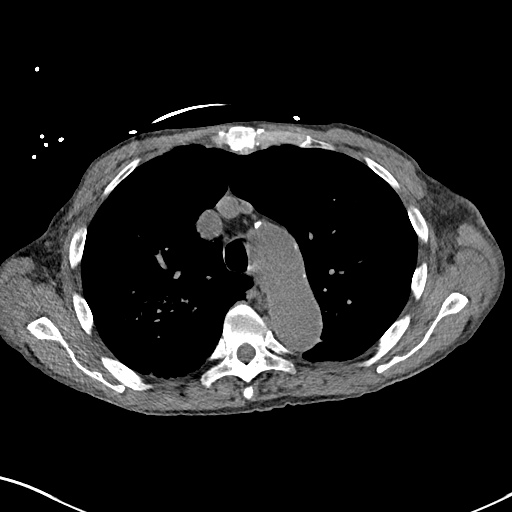
[im 125/181  lung]
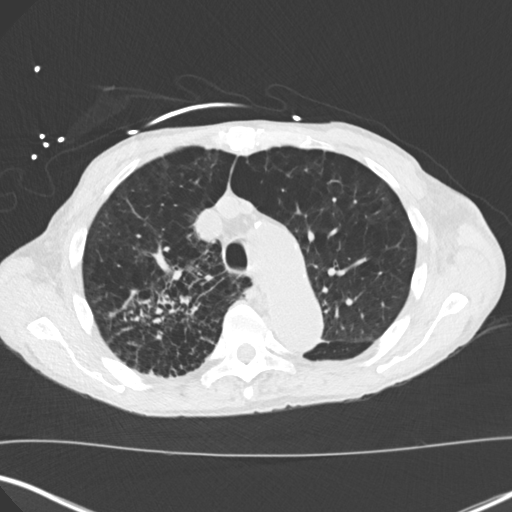
[im 139/181  lung]
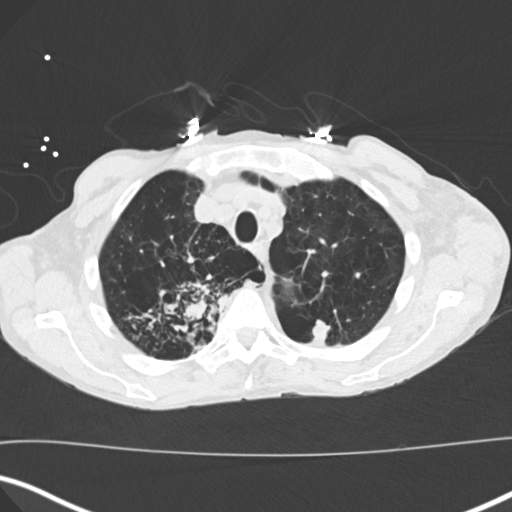
[im 153/181  lung]
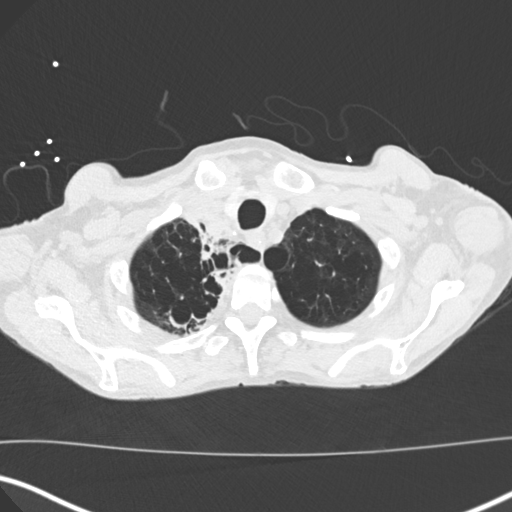
[im 167/181  lung]
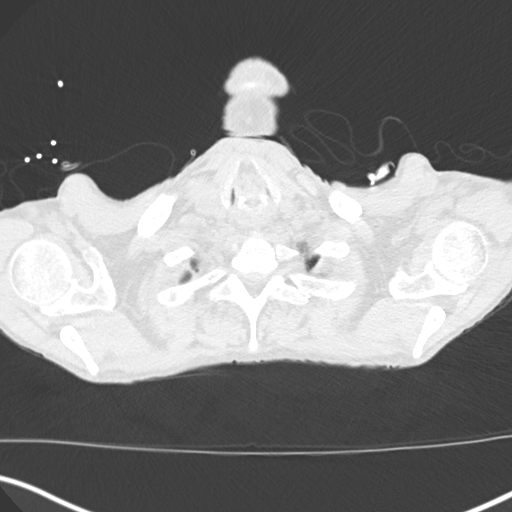

[Series 5: coronal · coronal · 0.71mm/px · 3 of 132 slices shown]
[im 27/132  lung]
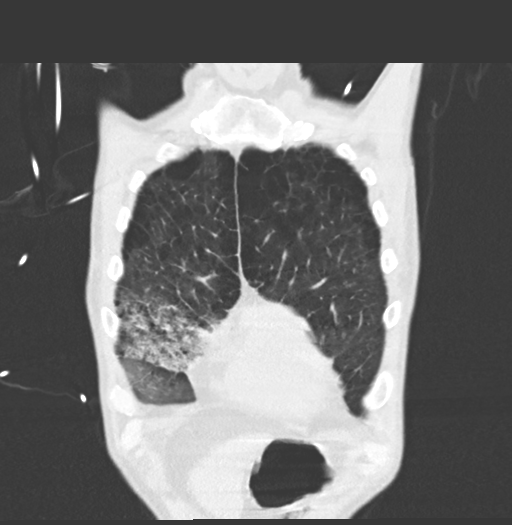
[im 53/132  lung]
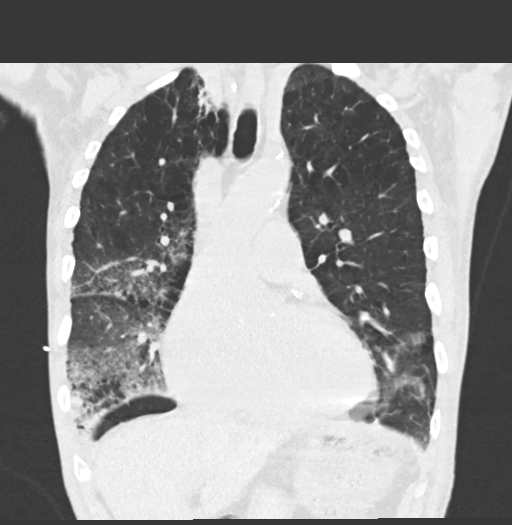
[im 79/132  lung]
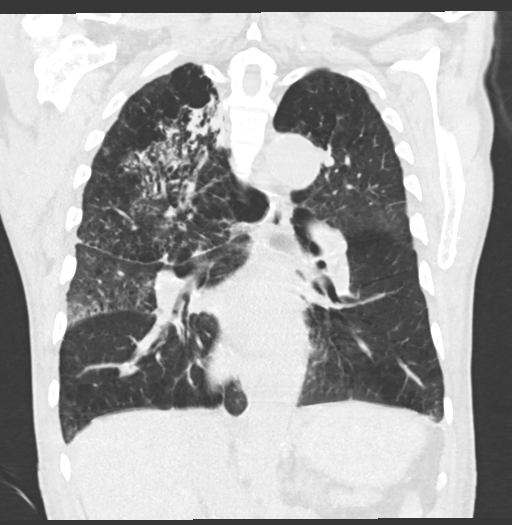

[15 of 36 positions shown; findings below may reference images not displayed]

FINDINGS: Cardiovascular: Atherosclerotic calcification of aorta, aortic valve
and coronary arteries. Heart is enlarged. No pericardial effusion.

Mediastinum/Nodes: No pathologically enlarged mediastinal or
axillary lymph nodes. Hilar regions are difficult to definitively
evaluate without IV contrast. Esophagus is grossly unremarkable.

Lungs/Pleura: Severe centrilobular emphysema. Image quality is
degraded by respiratory motion. Rounded areas of airspace
consolidation, probable fluid and ground-glass in the apical aspects
of both upper lobes. Patchy ground-glass in the anterior segment
right upper lobe and right middle lobe. New small bilateral pleural
effusions. Minimal associated compressive atelectasis in both lower
lobes. Debris is seen in the mainstem bronchi, bronchus intermedius
and right lower lobe bronchi. Airway is otherwise unremarkable.

Upper Abdomen: Visualized portions the liver, adrenal glands,
kidneys, spleen, pancreas, stomach and bowel are grossly
unremarkable. Calcified upper abdominal lymph nodes. Trace
perihepatic fluid.

Musculoskeletal: No worrisome lytic or sclerotic lesions.
IMPRESSION: 1. Areas of consolidation, probable fluid and ground-glass in the
upper lobes and right middle lobe, findings which may represent a
combination pneumonia and pulmonary hemorrhage. As it is difficult
to exclude a true pulmonary nodule in the upper lobes, follow-up CT
chest without contrast in 3-4 weeks is recommended in further
evaluation to exclude malignancy.
2. Small bilateral pleural effusions, new from 10/19/2019.
3. Aortic atherosclerosis ([id number].T). Coronary artery
calcification.
4.  Emphysema ([id number].R).

## 2020-01-31 DIAGNOSIS — N183 Chronic kidney disease, stage 3 unspecified: Secondary | ICD-10-CM | POA: Diagnosis not present

## 2020-01-31 DIAGNOSIS — I5043 Acute on chronic combined systolic (congestive) and diastolic (congestive) heart failure: Secondary | ICD-10-CM | POA: Diagnosis not present

## 2020-01-31 DIAGNOSIS — K8020 Calculus of gallbladder without cholecystitis without obstruction: Secondary | ICD-10-CM | POA: Diagnosis not present

## 2020-01-31 DIAGNOSIS — I10 Essential (primary) hypertension: Secondary | ICD-10-CM | POA: Diagnosis not present

## 2020-01-31 DIAGNOSIS — I509 Heart failure, unspecified: Secondary | ICD-10-CM | POA: Diagnosis not present

## 2020-01-31 DIAGNOSIS — R0602 Shortness of breath: Secondary | ICD-10-CM | POA: Diagnosis not present

## 2020-01-31 DIAGNOSIS — I50813 Acute on chronic right heart failure: Secondary | ICD-10-CM | POA: Diagnosis not present

## 2020-01-31 DIAGNOSIS — R52 Pain, unspecified: Secondary | ICD-10-CM | POA: Diagnosis not present

## 2020-01-31 DIAGNOSIS — I130 Hypertensive heart and chronic kidney disease with heart failure and stage 1 through stage 4 chronic kidney disease, or unspecified chronic kidney disease: Secondary | ICD-10-CM | POA: Diagnosis not present

## 2020-01-31 DIAGNOSIS — M879 Osteonecrosis, unspecified: Secondary | ICD-10-CM | POA: Diagnosis not present

## 2020-01-31 DIAGNOSIS — R0689 Other abnormalities of breathing: Secondary | ICD-10-CM | POA: Diagnosis not present

## 2020-01-31 DIAGNOSIS — N390 Urinary tract infection, site not specified: Secondary | ICD-10-CM | POA: Diagnosis not present

## 2020-01-31 DIAGNOSIS — Z20822 Contact with and (suspected) exposure to covid-19: Secondary | ICD-10-CM | POA: Diagnosis not present

## 2020-01-31 DIAGNOSIS — T83511A Infection and inflammatory reaction due to indwelling urethral catheter, initial encounter: Secondary | ICD-10-CM | POA: Diagnosis not present

## 2020-01-31 DIAGNOSIS — E440 Moderate protein-calorie malnutrition: Secondary | ICD-10-CM | POA: Diagnosis not present

## 2020-01-31 DIAGNOSIS — R1084 Generalized abdominal pain: Secondary | ICD-10-CM | POA: Diagnosis not present

## 2020-01-31 DIAGNOSIS — Z681 Body mass index (BMI) 19 or less, adult: Secondary | ICD-10-CM | POA: Diagnosis not present

## 2020-02-23 DIAGNOSIS — R338 Other retention of urine: Secondary | ICD-10-CM | POA: Diagnosis not present

## 2020-02-26 ENCOUNTER — Other Ambulatory Visit: Payer: Self-pay | Admitting: Urology

## 2020-03-01 DIAGNOSIS — I4891 Unspecified atrial fibrillation: Secondary | ICD-10-CM | POA: Diagnosis not present

## 2020-03-01 DIAGNOSIS — Z125 Encounter for screening for malignant neoplasm of prostate: Secondary | ICD-10-CM | POA: Diagnosis not present

## 2020-03-01 DIAGNOSIS — K7402 Hepatic fibrosis, advanced fibrosis: Secondary | ICD-10-CM | POA: Diagnosis not present

## 2020-03-01 DIAGNOSIS — R188 Other ascites: Secondary | ICD-10-CM | POA: Diagnosis not present

## 2020-03-01 DIAGNOSIS — Z1331 Encounter for screening for depression: Secondary | ICD-10-CM | POA: Diagnosis not present

## 2020-03-01 DIAGNOSIS — I5022 Chronic systolic (congestive) heart failure: Secondary | ICD-10-CM | POA: Diagnosis not present

## 2020-03-01 DIAGNOSIS — Z0000 Encounter for general adult medical examination without abnormal findings: Secondary | ICD-10-CM | POA: Diagnosis not present

## 2020-03-01 DIAGNOSIS — J1281 Pneumonia due to SARS-associated coronavirus: Secondary | ICD-10-CM | POA: Diagnosis not present

## 2020-03-01 DIAGNOSIS — K529 Noninfective gastroenteritis and colitis, unspecified: Secondary | ICD-10-CM | POA: Diagnosis not present

## 2020-03-01 DIAGNOSIS — K7469 Other cirrhosis of liver: Secondary | ICD-10-CM | POA: Diagnosis not present

## 2020-03-01 DIAGNOSIS — R338 Other retention of urine: Secondary | ICD-10-CM | POA: Diagnosis not present

## 2020-03-01 DIAGNOSIS — Z681 Body mass index (BMI) 19 or less, adult: Secondary | ICD-10-CM | POA: Diagnosis not present

## 2020-03-10 ENCOUNTER — Telehealth: Payer: Self-pay

## 2020-03-10 NOTE — Telephone Encounter (Signed)
-----   Message from Irine Seal, MD sent at 03/10/2020  8:52 AM EDT ----- This fellow is coming in on 7/7.  He had a culture with urodynamics that grew 2 organisms on 5/10.  If he doesn't have it scheduled, he will need a NV in the second week of June for a catheter change and a repeat culture will need to be obtained.

## 2020-03-10 NOTE — Telephone Encounter (Signed)
Appt made and attempted to call pt. No answer or message. Letter mailed.

## 2020-03-23 ENCOUNTER — Other Ambulatory Visit (HOSPITAL_COMMUNITY)
Admission: AD | Admit: 2020-03-23 | Discharge: 2020-03-23 | Disposition: A | Discharge status: Home / Self Care | Payer: Medicare HMO | Source: Skilled Nursing Facility | Attending: Urology | Admitting: Urology

## 2020-03-23 ENCOUNTER — Ambulatory Visit (INDEPENDENT_AMBULATORY_CARE_PROVIDER_SITE_OTHER): Payer: Medicare HMO

## 2020-03-23 ENCOUNTER — Other Ambulatory Visit: Payer: Self-pay

## 2020-03-23 DIAGNOSIS — R339 Retention of urine, unspecified: Secondary | ICD-10-CM | POA: Diagnosis not present

## 2020-03-23 DIAGNOSIS — R300 Dysuria: Secondary | ICD-10-CM | POA: Insufficient documentation

## 2020-03-23 NOTE — Progress Notes (Signed)
Cath Change/ Replacement  Patient is present today for a catheter change due to urinary retention.  42ml of water was removed from the balloon, a 16 FR foley cath was removed with out difficulty.  Patient was cleaned and prepped in a sterile fashion with betadine. A 16 FR foley cath was replaced into the bladder no complications were noted Urine return was noted 15 ml and urine was yellow in color. The balloon was filled with 82ml of sterile water. A  Leg bag was attached for drainage.  A night bag was also given to the patient and patient was given instruction on how to change from one bag to another. Patient was given proper instruction on catheter care.    Performed by: Jeanice Lim, RN   Follow up: Patient to keep follow up appointment next month

## 2020-03-23 NOTE — Addendum Note (Signed)
Addended by: Valentina Lucks on: 03/23/2020 02:41 PM   Modules accepted: Orders

## 2020-03-24 LAB — URINE CULTURE

## 2020-03-30 DIAGNOSIS — K8020 Calculus of gallbladder without cholecystitis without obstruction: Secondary | ICD-10-CM | POA: Diagnosis not present

## 2020-03-30 DIAGNOSIS — R188 Other ascites: Secondary | ICD-10-CM | POA: Diagnosis not present

## 2020-04-01 ENCOUNTER — Telehealth: Payer: Self-pay

## 2020-04-05 NOTE — Telephone Encounter (Signed)
Pt made aware of culture results.

## 2020-04-13 NOTE — Progress Notes (Signed)
Subjective: 1. Urinary retention      Blake Wise returns today in f/u with his urodynamic study.  He had a 637ml bladder with normal sensation and a well sustained bladder contraction but was unable to void and the foley was replaced.  He is for cystoscopy today.    GU Hx: Blake Wise is a 74 yo AAM who is sent by Dr. Stoney Bang for urinary retention found on a CT on 10/19/19 while he was at [facility] for [facility]F but I don't see that a foley was placed then.   He was admitted to [facility] and had another CT on  12/02/19 that showed persistent retention and a foley was placed and has not been changed since.  The bladder wall was somewhat thickened and there was a possible diverticulum on the dome.  His prostate is small.  He is on warfarin and has had some hematuria with the foley but not before.   He had frequency with lasix before his initial admission.  He would have urgency with UUI but no pain prior to his admission at [facility].  He has no other GU history but is a diabetic with neuropathy.     I have reviewed his [facility] records and [facility]'s from [facility] and [facility].    His Cr was 1.45 on 10/27/19.  Hbg was 8.8.  His last INR from [facility] was about 15.  UA on 1/3 was clear.  ROS:  ROS  No Known Allergies  Past Medical History:  Diagnosis Date   A-fib (HCC)    Anxiety    Atrial fibrillation (HCC)    CHF (congestive heart failure) (HCC)    Chronic renal insufficiency    COPD (chronic obstructive pulmonary disease) (HCC)    Diabetes ([place])    Diabetes mellitus without complication ([place])    Hypertension     Past Surgical History:  Procedure Laterality Date   COLONOSCOPY WITH ESOPHAGOGASTRODUODENOSCOPY (EGD)     several years ago by Dr. Ladona Horns (2015??)    Social History   Socioeconomic History   Marital status: Widowed    Spouse name: Not on file   Number of children: 4   Years of education: Not on file   Highest education level: Not on file  Occupational History   Occupation: retired  Tobacco  Use   Smoking status: Former Smoker    Packs/day: 1.00    Years: 49.00    Pack years: 49.00    Quit date: 2016    Years since quitting: 5.5   Smokeless tobacco: Never Used  Substance and Sexual Activity   Alcohol use: Not Currently   Drug use: Never   Sexual activity: Not on file  Other Topics Concern   Not on file  Social History Narrative   Not on file   Social Determinants of Health   Financial Resource Strain:    Difficulty of Paying Living Expenses:   Food Insecurity:    Worried About Charity fundraiser in the Last Year:    Arboriculturist in the Last Year:   Transportation Needs:    Film/video editor (Medical):    Lack of Transportation (Non-Medical):   Physical Activity:    Days of Exercise per Week:    Minutes of Exercise per Session:   Stress:    Feeling of Stress :   Social Connections:    Frequency of Communication with Friends and Family:    Frequency of Social Gatherings with Friends and Family:  Attends Religious Services:    Active Member of Clubs or Organizations:    Attends Music therapist:    Marital Status:   Intimate Partner Violence:    Fear of Current or Ex-Partner:    Emotionally Abused:    Physically Abused:    Sexually Abused:     Family History  Problem Relation Age of Onset   Colon cancer Neg Hx    Colon polyps Neg Hx     Anti-infectives: Anti-infectives (From admission, onward)   Start     Dose/Rate Route Frequency Ordered Stop   04/16/20 1615  CIPROFLOXACIN HCL 500 MG PO TABS        500 mg Oral  Once 04/16/20 1613 04/16/20 1613      Current Outpatient Medications  Medication Sig Dispense Refill   ACCU-CHEK AVIVA PLUS test strip      atorvastatin (LIPITOR) 10 MG tablet Take 10 mg by mouth daily.     glimepiride (AMARYL) 1 MG tablet Take 1 mg by mouth daily.     guaiFENesin (MUCINEX) 600 MG 12 hr tablet Take 2 tablets (1,200 mg total) by mouth 2 (two) times daily. 30 tablet  0   ipratropium-albuterol (DUONEB) 0.5-2.5 (3) MG/3ML SOLN Inhale 3 mLs into the lungs every 6 (six) hours as needed (breathing).      isosorbide mononitrate (IMDUR) 30 MG 24 hr tablet Take 30 mg by mouth daily.     levalbuterol (XOPENEX) 0.63 MG/3ML nebulizer solution Take 0.63 mg by nebulization every 4 (four) hours as needed for wheezing or shortness of breath.     LORazepam (ATIVAN) 0.5 MG tablet Take 0.5 mg by mouth every 8 (eight) hours.     metoprolol tartrate (LOPRESSOR) 25 MG tablet      mirtazapine (REMERON) 15 MG tablet Take 15 mg by mouth at bedtime.      Multiple Vitamins-Minerals (MENS ONE DAILY PO) Take 1 tablet by mouth daily.     sodium bicarbonate 650 MG tablet Take 650 mg by mouth 4 (four) times daily.     warfarin (COUMADIN) 5 MG tablet      albuterol (VENTOLIN HFA) 108 (90 Base) MCG/ACT inhaler Inhale into the lungs. (Patient not taking: Reported on 04/16/2020)     Cholecalciferol 25 MCG (1000 UT) tablet Take by mouth. (Patient not taking: Reported on 04/16/2020)     COLCRYS 0.6 MG tablet Take 0.6 mg by mouth 2 (two) times daily. (Patient not taking: Reported on 04/16/2020)     diltiazem (CARDIZEM) 60 MG tablet Take 1 tablet (60 mg total) by mouth 2 (two) times daily. (Patient not taking: Reported on 04/16/2020) 60 tablet 0   fluticasone (FLONASE) 50 MCG/ACT nasal spray  (Patient not taking: Reported on 04/16/2020)     furosemide (LASIX) 20 MG tablet Take 20 mg by mouth daily. (Patient not taking: Reported on 04/16/2020)     hydrALAZINE (APRESOLINE) 10 MG tablet  (Patient not taking: Reported on 04/16/2020)     methocarbamol (ROBAXIN) 500 MG tablet Take 500 mg by mouth 4 (four) times daily. (Patient not taking: Reported on 04/16/2020)     midodrine (PROAMATINE) 5 MG tablet  (Patient not taking: Reported on 04/16/2020)     Omega-3 Fatty Acids (FISH OIL) 1000 MG CAPS Take 1 capsule by mouth daily.  (Patient not taking: Reported on 04/16/2020)     pantoprazole (PROTONIX) 40 MG  tablet Take 1 tablet (40 mg total) by mouth daily. (Patient not taking: Reported on 04/16/2020) 30 tablet 0  predniSONE (DELTASONE) 20 MG tablet Take 40mg  po daily for 2 days then 30mg  daily for 2 days then 20mg  daily for 2 days then 10mg  daily for 2 days then stop (Patient not taking: Reported on 04/16/2020) 20 tablet 0   sitaGLIPtin (JANUVIA) 50 MG tablet Take 50 mg by mouth daily. (Patient not taking: Reported on 04/16/2020)     SPIRIVA HANDIHALER 18 MCG inhalation capsule 1 capsule daily. (Patient not taking: Reported on 04/16/2020)     SYMBICORT 160-4.5 MCG/ACT inhaler Inhale 1 puff into the lungs daily.  (Patient not taking: Reported on 04/16/2020)     tamsulosin (FLOMAX) 0.4 MG CAPS capsule Take 1 capsule (0.4 mg total) by mouth daily. 30 capsule 11   No current facility-administered medications for this visit.     Objective: BP 122/78    Pulse 69    Temp 97.7 F (36.5 C)    Ht 5\' 11"  (1.803 m)    Wt 123 lb (55.8 kg)    BMI 17.16 kg/m   Intake/Output from previous day: No intake/output data recorded. Intake/Output this shift: @IOTHISSHIFT @   Physical Exam  Lab Results:  No results found for this or any previous visit (from the past 24 hour(s)).  BMET No results for input(s): NA, K, CL, CO2, GLUCOSE, BUN, CREATININE, CALCIUM in the last 72 hours. PT/INR No results for input(s): LABPROT, INR in the last 72 hours. ABG No results for input(s): PHART, HCO3 in the last 72 hours.  Invalid input(s): PCO2, PO2  Studies/Results: URODYNAMICS STUDY  Test Indication: Retention The procedure's risks, benefits and infection risk were discussed with the patient.  PRE UROFLOW & CATHETERIZATION Procedure: Pre Uroflow Study The patient did not void. Arrived with a 16 fr foley. His catheter was removed, and a Urodynamic catheter was inserted.   CYSTOMETRY/CYSTOMETROGRAM The bladder was filled with room temperature water at a rate of less than 50 cc per minute. Injection of contrast was  performed for the cystometrogram. Max capacity was approx. 600 mls. First sensation occurred at 158 mls. Normal desire occurred at 168 mls. Stronger desire occurred at 269 mls.  The bladder was stable.   LEAK POINT PRESSURE LPPs were assessed with pt in a seated position. X-ray was used to assess for SUI. No leakage was noted with abdominal pressures of 81-100 cmH20.  PRESSURE FLOW STUDY He was able to generate a voluntary contraction. His contraction was well sustained, but he was unable to void. Max detrusor pressure was 47 cmH20.  ELECTROMYOGRAM Activity was measured by surface electrodes There was no voiding phase.  FLUOROSCOPY and VCUG Mild trrabeculation was noted. No reflux was seen. Pt confirmed no Neulasta OnPro Device.   POST PROCEDURE ANTIBIOTICS: No antibiotics were given post UDS. Urine was sent for culture. He was advised to watch for s/s of a break through UTI and to call the [facility] office if he develops any of them.   NURSE IMPRESSION Mr. Goody held a max capacity of approx. 600 mls. His 1st sensation was felt at 158 mls. No instability was noted. He was able to generate a well sustained voluntary contraction but was unable to void. Max detrusor pressure was 47 cmH20. Mild trabeculation was noted. No reflux was seen. A 16 fr foley was inserted post UDS and 600 mls was obtained. He will return to [facility] for UDS f/u.  Cystoscopy:   His foley was removed and he was prepped with betadine and the urethra was instilled with lidocaine jelly.  Cipro  500mg  po was given.  The flexible scope was passed.  The urethra was normal.  The external sphincter was intake.  The prostate was short without lateral lobe enlargement and the bladder neck was not too tight.  The bladder was difficult to visualize secondary to purulent debris.  There was moderate trabeculation and mucosal erythema without papillary tumors or stones.  The UO's were poorly visualized.   He attempted to void  after the cystoscopy but was unable to do much so a 98fr foley was reinserted and a urine was collected for culture prior to connecting the drainage tube.  There were no complications.    Assessment/Plan: Urinary retention that is chronic and is possibly secondary to diabetic cystopathy but he does have a decent detrusor contraction on UDS.   His prostate is small with minimal obstruction on cystoscopy.   He was unable to void effectively today.  I am going to start him on tamsulosin and reviewed the side effects.  He will return for a voiding trial in 2 weeks and if successful, he will need a PVR in 2-3 days and an OV with me in another month.   His foley was changed today and a culture was obtained.    Meds ordered this encounter  Medications   ciprofloxacin (CIPRO) tablet 500 mg   tamsulosin (FLOMAX) 0.4 MG CAPS capsule    Sig: Take 1 capsule (0.4 mg total) by mouth daily.    Dispense:  30 capsule    Refill:  11     No orders of the defined types were placed in this encounter.    Return in about 2 weeks (around 04/30/2020) for NV for voiding trial .    CC: Dr. Herbie Drape.      Irine Seal 04/16/2020 [phone]

## 2020-04-16 ENCOUNTER — Other Ambulatory Visit: Payer: Self-pay

## 2020-04-16 ENCOUNTER — Other Ambulatory Visit (HOSPITAL_COMMUNITY)
Admission: RE | Admit: 2020-04-16 | Discharge: 2020-04-16 | Disposition: A | Discharge status: Home / Self Care | Payer: Medicare HMO | Source: Ambulatory Visit | Attending: Urology | Admitting: Urology

## 2020-04-16 ENCOUNTER — Ambulatory Visit (INDEPENDENT_AMBULATORY_CARE_PROVIDER_SITE_OTHER): Payer: Medicare HMO | Admitting: Urology

## 2020-04-16 VITALS — BP 122/78 | HR 69 | Temp 97.7°F | Ht 71.0 in | Wt 123.0 lb

## 2020-04-16 DIAGNOSIS — R339 Retention of urine, unspecified: Secondary | ICD-10-CM | POA: Diagnosis not present

## 2020-04-16 MED ORDER — TAMSULOSIN HCL 0.4 MG PO CAPS: 0.4000 mg | ORAL_CAPSULE | Freq: Every day | ORAL | 11 refills | Status: AC

## 2020-04-16 MED ORDER — CIPROFLOXACIN HCL 500 MG PO TABS
500.0000 mg | ORAL_TABLET | Freq: Once | ORAL | Status: AC
  Administered 2020-04-16: 500 mg via ORAL

## 2020-04-16 NOTE — Progress Notes (Signed)
Urological Symptom Review  Patient is experiencing the following symptoms: none   Review of Systems  Gastrointestinal (upper)  : Indigestion/heartburn  Gastrointestinal (lower) : Negative for lower GI symptoms  Constitutional : Weight loss  Skin: Negative for skin symptoms  Eyes: Negative for eye symptoms  Ear/Nose/Throat : Sinus problems  Hematologic/Lymphatic: Swollen glands  Cardiovascular : Leg swelling  Respiratory : Shortness of breath  Endocrine: Negative for endocrine symptoms  Musculoskeletal: Negative for musculoskeletal symptoms  Neurological: Negative for neurological symptoms  Psychologic: Negative for psychiatric symptoms  Cath Change/ Replacement  Patient is present today for a catheter change due to urinary retention.  53ml of water was removed from the balloon, a 16FR foley cath was removed with out difficulty.  Patient was cleaned and prepped in a sterile fashion with betadine. A 16 FR foley cath was replaced into the bladder no complications were noted Urine return was noted 162ml and urine was yellow in color. The balloon was filled with 42ml of sterile water. A leg bag was attached for drainage.  A night bag was also given to the patient and patient was given instruction on how to change from one bag to another. Patient was given proper instruction on catheter care.    Performed by: Audreyanna Butkiewicz,LPN  Follow up: per MD note, urine sent for cx

## 2020-04-18 LAB — URINE CULTURE

## 2020-04-22 ENCOUNTER — Telehealth: Payer: Self-pay

## 2020-04-22 NOTE — Telephone Encounter (Signed)
-----   Message from Irine Seal, MD sent at 04/20/2020  2:13 PM EDT ----- Mx species.  No additional treatment.  ----- Message ----- From: Dorisann Frames, RN Sent: 04/20/2020  10:14 AM EDT To: Irine Seal, MD  culture

## 2020-04-22 NOTE — Telephone Encounter (Signed)
Left message to return call. Letter also mailed of results.

## 2020-05-09 DIAGNOSIS — I517 Cardiomegaly: Secondary | ICD-10-CM | POA: Diagnosis not present

## 2020-05-09 DIAGNOSIS — I4891 Unspecified atrial fibrillation: Secondary | ICD-10-CM | POA: Diagnosis not present

## 2020-05-09 DIAGNOSIS — I340 Nonrheumatic mitral (valve) insufficiency: Secondary | ICD-10-CM | POA: Diagnosis not present

## 2020-05-09 DIAGNOSIS — D649 Anemia, unspecified: Secondary | ICD-10-CM | POA: Diagnosis not present

## 2020-05-09 DIAGNOSIS — I952 Hypotension due to drugs: Secondary | ICD-10-CM | POA: Diagnosis not present

## 2020-05-09 DIAGNOSIS — R062 Wheezing: Secondary | ICD-10-CM | POA: Diagnosis not present

## 2020-05-09 DIAGNOSIS — R001 Bradycardia, unspecified: Secondary | ICD-10-CM | POA: Diagnosis not present

## 2020-05-09 DIAGNOSIS — I2729 Other secondary pulmonary hypertension: Secondary | ICD-10-CM | POA: Diagnosis not present

## 2020-05-09 DIAGNOSIS — I4510 Unspecified right bundle-branch block: Secondary | ICD-10-CM | POA: Diagnosis not present

## 2020-05-09 DIAGNOSIS — Z87891 Personal history of nicotine dependence: Secondary | ICD-10-CM | POA: Diagnosis not present

## 2020-05-09 DIAGNOSIS — R609 Edema, unspecified: Secondary | ICD-10-CM | POA: Diagnosis not present

## 2020-05-09 DIAGNOSIS — B348 Other viral infections of unspecified site: Secondary | ICD-10-CM | POA: Diagnosis not present

## 2020-05-09 DIAGNOSIS — I130 Hypertensive heart and chronic kidney disease with heart failure and stage 1 through stage 4 chronic kidney disease, or unspecified chronic kidney disease: Secondary | ICD-10-CM | POA: Diagnosis not present

## 2020-05-09 DIAGNOSIS — I5189 Other ill-defined heart diseases: Secondary | ICD-10-CM | POA: Diagnosis not present

## 2020-05-09 DIAGNOSIS — I472 Ventricular tachycardia: Secondary | ICD-10-CM | POA: Diagnosis not present

## 2020-05-09 DIAGNOSIS — I444 Left anterior fascicular block: Secondary | ICD-10-CM | POA: Diagnosis not present

## 2020-05-09 DIAGNOSIS — I10 Essential (primary) hypertension: Secondary | ICD-10-CM | POA: Diagnosis not present

## 2020-05-09 DIAGNOSIS — I482 Chronic atrial fibrillation, unspecified: Secondary | ICD-10-CM | POA: Diagnosis not present

## 2020-05-09 DIAGNOSIS — I272 Pulmonary hypertension, unspecified: Secondary | ICD-10-CM | POA: Diagnosis not present

## 2020-05-09 DIAGNOSIS — I50810 Right heart failure, unspecified: Secondary | ICD-10-CM | POA: Diagnosis not present

## 2020-05-09 DIAGNOSIS — J441 Chronic obstructive pulmonary disease with (acute) exacerbation: Secondary | ICD-10-CM | POA: Diagnosis not present

## 2020-05-09 DIAGNOSIS — N189 Chronic kidney disease, unspecified: Secondary | ICD-10-CM | POA: Diagnosis not present

## 2020-05-09 DIAGNOSIS — J90 Pleural effusion, not elsewhere classified: Secondary | ICD-10-CM | POA: Diagnosis not present

## 2020-05-09 DIAGNOSIS — R05 Cough: Secondary | ICD-10-CM | POA: Diagnosis not present

## 2020-05-09 DIAGNOSIS — E1122 Type 2 diabetes mellitus with diabetic chronic kidney disease: Secondary | ICD-10-CM | POA: Diagnosis not present

## 2020-05-09 DIAGNOSIS — R0602 Shortness of breath: Secondary | ICD-10-CM | POA: Diagnosis not present

## 2020-05-09 DIAGNOSIS — Z20822 Contact with and (suspected) exposure to covid-19: Secondary | ICD-10-CM | POA: Diagnosis not present

## 2020-05-09 DIAGNOSIS — J9690 Respiratory failure, unspecified, unspecified whether with hypoxia or hypercapnia: Secondary | ICD-10-CM | POA: Diagnosis not present

## 2020-05-09 DIAGNOSIS — Z79899 Other long term (current) drug therapy: Secondary | ICD-10-CM | POA: Diagnosis not present

## 2020-05-09 DIAGNOSIS — N184 Chronic kidney disease, stage 4 (severe): Secondary | ICD-10-CM | POA: Diagnosis not present

## 2020-05-09 DIAGNOSIS — I361 Nonrheumatic tricuspid (valve) insufficiency: Secondary | ICD-10-CM | POA: Diagnosis not present

## 2020-05-09 DIAGNOSIS — D688 Other specified coagulation defects: Secondary | ICD-10-CM | POA: Diagnosis not present

## 2020-05-09 DIAGNOSIS — R918 Other nonspecific abnormal finding of lung field: Secondary | ICD-10-CM | POA: Diagnosis not present

## 2020-05-09 DIAGNOSIS — Z681 Body mass index (BMI) 19 or less, adult: Secondary | ICD-10-CM | POA: Diagnosis not present

## 2020-05-25 DIAGNOSIS — N184 Chronic kidney disease, stage 4 (severe): Secondary | ICD-10-CM | POA: Diagnosis not present

## 2020-05-25 DIAGNOSIS — Z681 Body mass index (BMI) 19 or less, adult: Secondary | ICD-10-CM | POA: Diagnosis not present

## 2020-05-25 DIAGNOSIS — I480 Paroxysmal atrial fibrillation: Secondary | ICD-10-CM | POA: Diagnosis not present

## 2020-05-25 DIAGNOSIS — J440 Chronic obstructive pulmonary disease with acute lower respiratory infection: Secondary | ICD-10-CM | POA: Diagnosis not present

## 2020-06-03 NOTE — Progress Notes (Deleted)
Subjective: No diagnosis found.   Mr. General returns today in f/u with his urodynamic study.  He had a 690ml bladder with normal sensation and a well sustained bladder contraction but was unable to void and the foley was replaced.  He is for cystoscopy today.    GU Hx: Mr. Nhan is a 74 yo AAM who is sent by Dr. Stoney Bang for urinary retention found on a CT on 10/19/19 while he was at [facility] for [facility] but I don't see that a foley was placed then.   He was admitted to [facility] and had another CT on  12/02/19 that showed persistent retention and a foley was placed and has not been changed since.  The bladder wall was somewhat thickened and there was a possible diverticulum on the dome.  His prostate is small.  He is on warfarin and has had some hematuria with the foley but not before.   He had frequency with lasix before his initial admission.  He would have urgency with UUI but no pain prior to his admission at [facility].  He has no other GU history but is a diabetic with neuropathy.     I have reviewed his [facility] records and [facility] from [facility].    His Cr was 1.45 on 10/27/19.  Hbg was 8.8.  His last INR from [facility] was about 15.  UA on 1/3 was clear.  ROS:  ROS  No Known Allergies  Past Medical History:  Diagnosis Date  . A-fib ([place])   . Anxiety   . Atrial fibrillation ([place])   . CHF (congestive heart failure) ([place])   . Chronic renal insufficiency   . COPD (chronic obstructive pulmonary disease) ([place])   . Diabetes ([place])   . Diabetes mellitus without complication ([place])   . Hypertension     Past Surgical History:  Procedure Laterality Date  . COLONOSCOPY WITH ESOPHAGOGASTRODUODENOSCOPY (EGD)     several years ago by Dr. Ladona Horns (2015??)    Social History   Socioeconomic History  . Marital status: Widowed    Spouse name: Not on file  . Number of children: 4  . Years of education: Not on file  . Highest education level: Not on file  Occupational History  . Occupation: retired  Tobacco Use   . Smoking status: Former Smoker    Packs/day: 1.00    Years: 49.00    Pack years: 49.00    Quit date: 2016    Years since quitting: 5.6  . Smokeless tobacco: Never Used  Substance and Sexual Activity  . Alcohol use: Not Currently  . Drug use: Never  . Sexual activity: Not on file  Other Topics Concern  . Not on file  Social History Narrative  . Not on file   Social Determinants of Health   Financial Resource Strain:   . Difficulty of Paying Living Expenses: Not on file  Food Insecurity:   . Worried About Charity fundraiser in the Last Year: Not on file  . Ran Out of Food in the Last Year: Not on file  Transportation Needs:   . Lack of Transportation (Medical): Not on file  . Lack of Transportation (Non-Medical): Not on file  Physical Activity:   . Days of Exercise per Week: Not on file  . Minutes of Exercise per Session: Not on file  Stress:   . Feeling of Stress : Not on file  Social Connections:   . Frequency of Communication with Friends  and Family: Not on file  . Frequency of Social Gatherings with Friends and Family: Not on file  . Attends Religious Services: Not on file  . Active Member of Clubs or Organizations: Not on file  . Attends Archivist Meetings: Not on file  . Marital Status: Not on file  Intimate Partner Violence:   . Fear of Current or Ex-Partner: Not on file  . Emotionally Abused: Not on file  . Physically Abused: Not on file  . Sexually Abused: Not on file    Family History  Problem Relation Age of Onset  . Colon cancer Neg Hx   . Colon polyps Neg Hx     Anti-infectives: Anti-infectives (From admission, onward)   None      Current Outpatient Medications  Medication Sig Dispense Refill  . ACCU-CHEK AVIVA PLUS test strip     . albuterol (VENTOLIN HFA) 108 (90 Base) MCG/ACT inhaler Inhale into the lungs. (Patient not taking: Reported on 04/16/2020)    . atorvastatin (LIPITOR) 10 MG tablet Take 10 mg by mouth daily.    .  Cholecalciferol 25 MCG (1000 UT) tablet Take by mouth. (Patient not taking: Reported on 04/16/2020)    . COLCRYS 0.6 MG tablet Take 0.6 mg by mouth 2 (two) times daily. (Patient not taking: Reported on 04/16/2020)    . diltiazem (CARDIZEM) 60 MG tablet Take 1 tablet (60 mg total) by mouth 2 (two) times daily. (Patient not taking: Reported on 04/16/2020) 60 tablet 0  . fluticasone (FLONASE) 50 MCG/ACT nasal spray  (Patient not taking: Reported on 04/16/2020)    . furosemide (LASIX) 20 MG tablet Take 20 mg by mouth daily. (Patient not taking: Reported on 04/16/2020)    . glimepiride (AMARYL) 1 MG tablet Take 1 mg by mouth daily.    Marland Kitchen guaiFENesin (MUCINEX) 600 MG 12 hr tablet Take 2 tablets (1,200 mg total) by mouth 2 (two) times daily. 30 tablet 0  . hydrALAZINE (APRESOLINE) 10 MG tablet  (Patient not taking: Reported on 04/16/2020)    . ipratropium-albuterol (DUONEB) 0.5-2.5 (3) MG/3ML SOLN Inhale 3 mLs into the lungs every 6 (six) hours as needed (breathing).     . isosorbide mononitrate (IMDUR) 30 MG 24 hr tablet Take 30 mg by mouth daily.    Marland Kitchen levalbuterol (XOPENEX) 0.63 MG/3ML nebulizer solution Take 0.63 mg by nebulization every 4 (four) hours as needed for wheezing or shortness of breath.    Marland Kitchen LORazepam (ATIVAN) 0.5 MG tablet Take 0.5 mg by mouth every 8 (eight) hours.    . methocarbamol (ROBAXIN) 500 MG tablet Take 500 mg by mouth 4 (four) times daily. (Patient not taking: Reported on 04/16/2020)    . metoprolol tartrate (LOPRESSOR) 25 MG tablet     . midodrine (PROAMATINE) 5 MG tablet  (Patient not taking: Reported on 04/16/2020)    . mirtazapine (REMERON) 15 MG tablet Take 15 mg by mouth at bedtime.     . Multiple Vitamins-Minerals (MENS ONE DAILY PO) Take 1 tablet by mouth daily.    . Omega-3 Fatty Acids (FISH OIL) 1000 MG CAPS Take 1 capsule by mouth daily.  (Patient not taking: Reported on 04/16/2020)    . pantoprazole (PROTONIX) 40 MG tablet Take 1 tablet (40 mg total) by mouth daily. (Patient not  taking: Reported on 04/16/2020) 30 tablet 0  . predniSONE (DELTASONE) 20 MG tablet Take 40mg  po daily for 2 days then 30mg  daily for 2 days then 20mg  daily for 2 days then 10mg   daily for 2 days then stop (Patient not taking: Reported on 04/16/2020) 20 tablet 0  . sitaGLIPtin (JANUVIA) 50 MG tablet Take 50 mg by mouth daily. (Patient not taking: Reported on 04/16/2020)    . sodium bicarbonate 650 MG tablet Take 650 mg by mouth 4 (four) times daily.    Marland Kitchen SPIRIVA HANDIHALER 18 MCG inhalation capsule 1 capsule daily. (Patient not taking: Reported on 04/16/2020)    . SYMBICORT 160-4.5 MCG/ACT inhaler Inhale 1 puff into the lungs daily.  (Patient not taking: Reported on 04/16/2020)    . tamsulosin (FLOMAX) 0.4 MG CAPS capsule Take 1 capsule (0.4 mg total) by mouth daily. 30 capsule 11  . warfarin (COUMADIN) 5 MG tablet      No current facility-administered medications for this visit.     Objective: There were no vitals taken for this visit.  Intake/Output from previous day: No intake/output data recorded. Intake/Output this shift: @IOTHISSHIFT @   Physical Exam  Lab Results:  No results found for this or any previous visit (from the past 24 hour(s)).  BMET No results for input(s): NA, K, CL, CO2, GLUCOSE, BUN, CREATININE, CALCIUM in the last 72 hours. PT/INR No results for input(s): LABPROT, INR in the last 72 hours. ABG No results for input(s): PHART, HCO3 in the last 72 hours.  Invalid input(s): PCO2, PO2  Studies/Results: URODYNAMICS STUDY  Test Indication: Retention The procedure's risks, benefits and infection risk were discussed with the patient.  PRE UROFLOW & CATHETERIZATION Procedure: Pre Uroflow Study The patient did not void. Arrived with a 16 fr foley. His catheter was removed, and a Urodynamic catheter was inserted.   CYSTOMETRY/CYSTOMETROGRAM The bladder was filled with room temperature water at a rate of less than 50 cc per minute. Injection of contrast was performed for  the cystometrogram. Max capacity was approx. 600 mls. First sensation occurred at 158 mls. Normal desire occurred at 168 mls. Stronger desire occurred at 269 mls.  The bladder was stable.   LEAK POINT PRESSURE LPPs were assessed with pt in a seated position. X-ray was used to assess for SUI. No leakage was noted with abdominal pressures of 81-100 cmH20.  PRESSURE FLOW STUDY He was able to generate a voluntary contraction. His contraction was well sustained, but he was unable to void. Max detrusor pressure was 47 cmH20.  ELECTROMYOGRAM Activity was measured by surface electrodes There was no voiding phase.  FLUOROSCOPY and VCUG Mild trrabeculation was noted. No reflux was seen. Pt confirmed no Neulasta OnPro Device.   POST PROCEDURE ANTIBIOTICS: No antibiotics were given post UDS. Urine was sent for culture. He was advised to watch for s/s of a break through UTI and to call the [facility] office if he develops any of them.   NURSE IMPRESSION Mr. Monette held a max capacity of approx. 600 mls. His 1st sensation was felt at 158 mls. No instability was noted. He was able to generate a well sustained voluntary contraction but was unable to void. Max detrusor pressure was 47 cmH20. Mild trabeculation was noted. No reflux was seen. A 16 fr foley was inserted post UDS and 600 mls was obtained. He will return to [facility] for [facility]DS f/u.  Cystoscopy:   His foley was removed and he was prepped with betadine and the urethra was instilled with lidocaine jelly.  Cipro 500mg  po was given.  The flexible scope was passed.  The urethra was normal.  The external sphincter was intake.  The prostate was short without lateral  lobe enlargement and the bladder neck was not too tight.  The bladder was difficult to visualize secondary to purulent debris.  There was moderate trabeculation and mucosal erythema without papillary tumors or stones.  The UO's were poorly visualized.   He attempted to void after the  cystoscopy but was unable to do much so a 47fr foley was reinserted and a urine was collected for culture prior to connecting the drainage tube.  There were no complications.    Assessment/Plan: Urinary retention that is chronic and is possibly secondary to diabetic cystopathy but he does have a decent detrusor contraction on UDS.   His prostate is small with minimal obstruction on cystoscopy.   He was unable to void effectively today.  I am going to start him on tamsulosin and reviewed the side effects.  He will return for a voiding trial in 2 weeks and if successful, he will need a PVR in 2-3 days and an OV with me in another month.   His foley was changed today and a culture was obtained.    No orders of the defined types were placed in this encounter.    No orders of the defined types were placed in this encounter.    No follow-ups on file.    CC: Dr. Herbie Drape.      Irine Seal 06/03/2020 [phone]

## 2020-06-04 ENCOUNTER — Ambulatory Visit: Payer: Medicare HMO | Admitting: Urology

## 2020-06-07 DIAGNOSIS — R188 Other ascites: Secondary | ICD-10-CM | POA: Diagnosis not present

## 2020-06-07 DIAGNOSIS — J449 Chronic obstructive pulmonary disease, unspecified: Secondary | ICD-10-CM | POA: Diagnosis not present

## 2020-06-07 DIAGNOSIS — E1121 Type 2 diabetes mellitus with diabetic nephropathy: Secondary | ICD-10-CM | POA: Diagnosis not present

## 2020-06-07 DIAGNOSIS — N184 Chronic kidney disease, stage 4 (severe): Secondary | ICD-10-CM | POA: Diagnosis not present

## 2020-06-07 DIAGNOSIS — J440 Chronic obstructive pulmonary disease with acute lower respiratory infection: Secondary | ICD-10-CM | POA: Diagnosis not present

## 2020-06-07 DIAGNOSIS — Z0000 Encounter for general adult medical examination without abnormal findings: Secondary | ICD-10-CM | POA: Diagnosis not present

## 2020-06-07 DIAGNOSIS — Z681 Body mass index (BMI) 19 or less, adult: Secondary | ICD-10-CM | POA: Diagnosis not present

## 2020-06-07 DIAGNOSIS — I480 Paroxysmal atrial fibrillation: Secondary | ICD-10-CM | POA: Diagnosis not present

## 2020-06-08 ENCOUNTER — Ambulatory Visit (INDEPENDENT_AMBULATORY_CARE_PROVIDER_SITE_OTHER): Payer: Medicare HMO

## 2020-06-08 ENCOUNTER — Other Ambulatory Visit: Payer: Self-pay

## 2020-06-08 DIAGNOSIS — R339 Retention of urine, unspecified: Secondary | ICD-10-CM

## 2020-06-08 NOTE — Progress Notes (Signed)
Fill and Pull Catheter Removal  Patient is present today for a catheter removal.  Patient was cleaned and prepped in a sterile fashion 283ml of sterile water/ saline was instilled into the bladder when the patient felt the urge to urinate. 8 ml of water was then drained from the balloon.  A 16FR foley cath was removed from the bladder no complications were noted .  Patient as then given some time to void on their own.  Patient can void  82ml on their own after some time.  Patient tolerated well. Pt was told to go home and drink water and come back at 2:30 for PVR.  Performed by: Antionette Char, Jeananne Bedwell,LPN

## 2020-06-14 NOTE — Progress Notes (Signed)
Pt did not return at 2:30 to be checked to see how well he was emptying though advised to.

## 2020-06-25 DIAGNOSIS — I480 Paroxysmal atrial fibrillation: Secondary | ICD-10-CM | POA: Diagnosis not present

## 2020-07-16 ENCOUNTER — Ambulatory Visit: Payer: Medicare HMO | Admitting: Urology

## 2020-07-16 NOTE — Progress Notes (Deleted)
Subjective: 1. Dysuria      Blake Wise returns today in f/u with his urodynamic study.  He had a 61ml bladder with normal sensation and a well sustained bladder contraction but was unable to void and the foley was replaced.  He is for cystoscopy today.    GU Hx: Blake Wise is a 74 yo AAM who is sent by Dr. Stoney Bang for urinary retention found on a CT on 10/19/19 while he was at [facility] for [facility]F but I don't see that a foley was placed then.   He was admitted to [facility] and had another CT on  12/02/19 that showed persistent retention and a foley was placed and has not been changed since.  The bladder wall was somewhat thickened and there was a possible diverticulum on the dome.  His prostate is small.  He is on warfarin and has had some hematuria with the foley but not before.   He had frequency with lasix before his initial admission.  He would have urgency with UUI but no pain prior to his admission at [facility].  He has no other GU history but is a diabetic with neuropathy.     I have reviewed his [facility] records and [facility] from [facility] and [facility].    His Cr was 1.45 on 10/27/19.  Hbg was 8.8.  His last INR from [facility] was about 15.  UA on 1/3 was clear.  ROS:  ROS  No Known Allergies  Past Medical History:  Diagnosis Date  . A-fib ([place])   . Anxiety   . Atrial fibrillation ([place])   . CHF (congestive heart failure) ([place])   . Chronic renal insufficiency   . COPD (chronic obstructive pulmonary disease) ([place])   . Diabetes ([place])   . Diabetes mellitus without complication ([place])   . Hypertension     Past Surgical History:  Procedure Laterality Date  . COLONOSCOPY WITH ESOPHAGOGASTRODUODENOSCOPY (EGD)     several years ago by Dr. Ladona Horns (2015??)    Social History   Socioeconomic History  . Marital status: Widowed    Spouse name: Not on file  . Number of children: 4  . Years of education: Not on file  . Highest education level: Not on file  Occupational History  . Occupation: retired  Tobacco Use  .  Smoking status: Former Smoker    Packs/day: 1.00    Years: 49.00    Pack years: 49.00    Quit date: 2016    Years since quitting: 5.7  . Smokeless tobacco: Never Used  Substance and Sexual Activity  . Alcohol use: Not Currently  . Drug use: Never  . Sexual activity: Not on file  Other Topics Concern  . Not on file  Social History Narrative  . Not on file   Social Determinants of Health   Financial Resource Strain:   . Difficulty of Paying Living Expenses: Not on file  Food Insecurity:   . Worried About Charity fundraiser in the Last Year: Not on file  . Ran Out of Food in the Last Year: Not on file  Transportation Needs:   . Lack of Transportation (Medical): Not on file  . Lack of Transportation (Non-Medical): Not on file  Physical Activity:   . Days of Exercise per Week: Not on file  . Minutes of Exercise per Session: Not on file  Stress:   . Feeling of Stress : Not on file  Social Connections:   . Frequency of Communication  with Friends and Family: Not on file  . Frequency of Social Gatherings with Friends and Family: Not on file  . Attends Religious Services: Not on file  . Active Member of Clubs or Organizations: Not on file  . Attends Archivist Meetings: Not on file  . Marital Status: Not on file  Intimate Partner Violence:   . Fear of Current or Ex-Partner: Not on file  . Emotionally Abused: Not on file  . Physically Abused: Not on file  . Sexually Abused: Not on file    Family History  Problem Relation Age of Onset  . Colon cancer Neg Hx   . Colon polyps Neg Hx     Anti-infectives: Anti-infectives (From admission, onward)   None      Current Outpatient Medications  Medication Sig Dispense Refill  . ACCU-CHEK AVIVA PLUS test strip     . albuterol (VENTOLIN HFA) 108 (90 Base) MCG/ACT inhaler Inhale into the lungs. (Patient not taking: Reported on 04/16/2020)    . atorvastatin (LIPITOR) 10 MG tablet Take 10 mg by mouth daily.    .  Cholecalciferol 25 MCG (1000 UT) tablet Take by mouth. (Patient not taking: Reported on 04/16/2020)    . COLCRYS 0.6 MG tablet Take 0.6 mg by mouth 2 (two) times daily. (Patient not taking: Reported on 04/16/2020)    . diltiazem (CARDIZEM) 60 MG tablet Take 1 tablet (60 mg total) by mouth 2 (two) times daily. (Patient not taking: Reported on 04/16/2020) 60 tablet 0  . fluticasone (FLONASE) 50 MCG/ACT nasal spray  (Patient not taking: Reported on 04/16/2020)    . furosemide (LASIX) 20 MG tablet Take 20 mg by mouth daily. (Patient not taking: Reported on 04/16/2020)    . glimepiride (AMARYL) 1 MG tablet Take 1 mg by mouth daily.    Marland Kitchen guaiFENesin (MUCINEX) 600 MG 12 hr tablet Take 2 tablets (1,200 mg total) by mouth 2 (two) times daily. 30 tablet 0  . hydrALAZINE (APRESOLINE) 10 MG tablet  (Patient not taking: Reported on 04/16/2020)    . ipratropium-albuterol (DUONEB) 0.5-2.5 (3) MG/3ML SOLN Inhale 3 mLs into the lungs every 6 (six) hours as needed (breathing).     . isosorbide mononitrate (IMDUR) 30 MG 24 hr tablet Take 30 mg by mouth daily.    Marland Kitchen levalbuterol (XOPENEX) 0.63 MG/3ML nebulizer solution Take 0.63 mg by nebulization every 4 (four) hours as needed for wheezing or shortness of breath.    Marland Kitchen LORazepam (ATIVAN) 0.5 MG tablet Take 0.5 mg by mouth every 8 (eight) hours.    . methocarbamol (ROBAXIN) 500 MG tablet Take 500 mg by mouth 4 (four) times daily. (Patient not taking: Reported on 04/16/2020)    . metoprolol tartrate (LOPRESSOR) 25 MG tablet     . midodrine (PROAMATINE) 5 MG tablet  (Patient not taking: Reported on 04/16/2020)    . mirtazapine (REMERON) 15 MG tablet Take 15 mg by mouth at bedtime.     . Multiple Vitamins-Minerals (MENS ONE DAILY PO) Take 1 tablet by mouth daily.    . Omega-3 Fatty Acids (FISH OIL) 1000 MG CAPS Take 1 capsule by mouth daily.  (Patient not taking: Reported on 04/16/2020)    . pantoprazole (PROTONIX) 40 MG tablet Take 1 tablet (40 mg total) by mouth daily. (Patient not  taking: Reported on 04/16/2020) 30 tablet 0  . predniSONE (DELTASONE) 20 MG tablet Take 40mg  po daily for 2 days then 30mg  daily for 2 days then 20mg  daily for 2 days  then 10mg  daily for 2 days then stop (Patient not taking: Reported on 04/16/2020) 20 tablet 0  . sitaGLIPtin (JANUVIA) 50 MG tablet Take 50 mg by mouth daily. (Patient not taking: Reported on 04/16/2020)    . sodium bicarbonate 650 MG tablet Take 650 mg by mouth 4 (four) times daily.    Marland Kitchen SPIRIVA HANDIHALER 18 MCG inhalation capsule 1 capsule daily. (Patient not taking: Reported on 04/16/2020)    . SYMBICORT 160-4.5 MCG/ACT inhaler Inhale 1 puff into the lungs daily.  (Patient not taking: Reported on 04/16/2020)    . tamsulosin (FLOMAX) 0.4 MG CAPS capsule Take 1 capsule (0.4 mg total) by mouth daily. 30 capsule 11  . warfarin (COUMADIN) 5 MG tablet      No current facility-administered medications for this visit.     Objective: There were no vitals taken for this visit.  Intake/Output from previous day: No intake/output data recorded. Intake/Output this shift: @IOTHISSHIFT @   Physical Exam  Lab Results:  No results found for this or any previous visit (from the past 24 hour(s)).  BMET No results for input(s): NA, K, CL, CO2, GLUCOSE, BUN, CREATININE, CALCIUM in the last 72 hours. PT/INR No results for input(s): LABPROT, INR in the last 72 hours. ABG No results for input(s): PHART, HCO3 in the last 72 hours.  Invalid input(s): PCO2, PO2  Studies/Results: URODYNAMICS STUDY  Test Indication: Retention The procedure's risks, benefits and infection risk were discussed with the patient.  PRE UROFLOW & CATHETERIZATION Procedure: Pre Uroflow Study The patient did not void. Arrived with a 16 fr foley. His catheter was removed, and a Urodynamic catheter was inserted.   CYSTOMETRY/CYSTOMETROGRAM The bladder was filled with room temperature water at a rate of less than 50 cc per minute. Injection of contrast was performed for  the cystometrogram. Max capacity was approx. 600 mls. First sensation occurred at 158 mls. Normal desire occurred at 168 mls. Stronger desire occurred at 269 mls.  The bladder was stable.   LEAK POINT PRESSURE LPPs were assessed with pt in a seated position. X-ray was used to assess for SUI. No leakage was noted with abdominal pressures of 81-100 cmH20.  PRESSURE FLOW STUDY He was able to generate a voluntary contraction. His contraction was well sustained, but he was unable to void. Max detrusor pressure was 47 cmH20.  ELECTROMYOGRAM Activity was measured by surface electrodes There was no voiding phase.  FLUOROSCOPY and VCUG Mild trrabeculation was noted. No reflux was seen. Pt confirmed no Neulasta OnPro Device.   POST PROCEDURE ANTIBIOTICS: No antibiotics were given post UDS. Urine was sent for culture. He was advised to watch for s/s of a break through UTI and to call the [facility] office if he develops any of them.   NURSE IMPRESSION Blake Wise held a max capacity of approx. 600 mls. His 1st sensation was felt at 158 mls. No instability was noted. He was able to generate a well sustained voluntary contraction but was unable to void. Max detrusor pressure was 47 cmH20. Mild trabeculation was noted. No reflux was seen. A 16 fr foley was inserted post UDS and 600 mls was obtained. He will return to [facility] for UDS f/u.  Cystoscopy:   His foley was removed and he was prepped with betadine and the urethra was instilled with lidocaine jelly.  Cipro 500mg  po was given.  The flexible scope was passed.  The urethra was normal.  The external sphincter was intake.  The prostate was short  without lateral lobe enlargement and the bladder neck was not too tight.  The bladder was difficult to visualize secondary to purulent debris.  There was moderate trabeculation and mucosal erythema without papillary tumors or stones.  The UO's were poorly visualized.   He attempted to void after the  cystoscopy but was unable to do much so a 29fr foley was reinserted and a urine was collected for culture prior to connecting the drainage tube.  There were no complications.    Assessment/Plan: Urinary retention that is chronic and is possibly secondary to diabetic cystopathy but he does have a decent detrusor contraction on UDS.   His prostate is small with minimal obstruction on cystoscopy.   He was unable to void effectively today.  I am going to start him on tamsulosin and reviewed the side effects.  He will return for a voiding trial in 2 weeks and if successful, he will need a PVR in 2-3 days and an OV with me in another month.   His foley was changed today and a culture was obtained.    No orders of the defined types were placed in this encounter.    No orders of the defined types were placed in this encounter.    No follow-ups on file.    CC: Dr. Herbie Drape.      Irine Seal 07/16/2020 [phone]

## 2020-08-12 DIAGNOSIS — I081 Rheumatic disorders of both mitral and tricuspid valves: Secondary | ICD-10-CM | POA: Diagnosis not present

## 2020-08-12 DIAGNOSIS — R069 Unspecified abnormalities of breathing: Secondary | ICD-10-CM | POA: Diagnosis not present

## 2020-08-12 DIAGNOSIS — I440 Atrioventricular block, first degree: Secondary | ICD-10-CM | POA: Diagnosis not present

## 2020-08-12 DIAGNOSIS — N189 Chronic kidney disease, unspecified: Secondary | ICD-10-CM | POA: Diagnosis not present

## 2020-08-12 DIAGNOSIS — J438 Other emphysema: Secondary | ICD-10-CM | POA: Diagnosis not present

## 2020-08-12 DIAGNOSIS — R0602 Shortness of breath: Secondary | ICD-10-CM | POA: Diagnosis not present

## 2020-08-12 DIAGNOSIS — R911 Solitary pulmonary nodule: Secondary | ICD-10-CM | POA: Diagnosis not present

## 2020-08-12 DIAGNOSIS — I272 Pulmonary hypertension, unspecified: Secondary | ICD-10-CM | POA: Diagnosis not present

## 2020-08-12 DIAGNOSIS — Z8679 Personal history of other diseases of the circulatory system: Secondary | ICD-10-CM | POA: Diagnosis not present

## 2020-08-12 DIAGNOSIS — I509 Heart failure, unspecified: Secondary | ICD-10-CM | POA: Diagnosis not present

## 2020-08-12 DIAGNOSIS — J449 Chronic obstructive pulmonary disease, unspecified: Secondary | ICD-10-CM | POA: Diagnosis not present

## 2020-08-12 DIAGNOSIS — I482 Chronic atrial fibrillation, unspecified: Secondary | ICD-10-CM | POA: Diagnosis not present

## 2020-08-12 DIAGNOSIS — E1122 Type 2 diabetes mellitus with diabetic chronic kidney disease: Secondary | ICD-10-CM | POA: Diagnosis not present

## 2020-08-12 DIAGNOSIS — J811 Chronic pulmonary edema: Secondary | ICD-10-CM | POA: Diagnosis not present

## 2020-08-12 DIAGNOSIS — I5189 Other ill-defined heart diseases: Secondary | ICD-10-CM | POA: Diagnosis not present

## 2020-08-12 DIAGNOSIS — J9602 Acute respiratory failure with hypercapnia: Secondary | ICD-10-CM | POA: Diagnosis not present

## 2020-08-12 DIAGNOSIS — J9601 Acute respiratory failure with hypoxia: Secondary | ICD-10-CM | POA: Diagnosis not present

## 2020-08-12 DIAGNOSIS — J441 Chronic obstructive pulmonary disease with (acute) exacerbation: Secondary | ICD-10-CM | POA: Diagnosis not present

## 2020-08-12 DIAGNOSIS — I130 Hypertensive heart and chronic kidney disease with heart failure and stage 1 through stage 4 chronic kidney disease, or unspecified chronic kidney disease: Secondary | ICD-10-CM | POA: Diagnosis not present

## 2020-08-12 DIAGNOSIS — C787 Secondary malignant neoplasm of liver and intrahepatic bile duct: Secondary | ICD-10-CM | POA: Diagnosis not present

## 2020-08-12 DIAGNOSIS — I5033 Acute on chronic diastolic (congestive) heart failure: Secondary | ICD-10-CM | POA: Diagnosis not present

## 2020-08-12 DIAGNOSIS — R0600 Dyspnea, unspecified: Secondary | ICD-10-CM | POA: Diagnosis not present

## 2020-08-12 DIAGNOSIS — I517 Cardiomegaly: Secondary | ICD-10-CM | POA: Diagnosis not present

## 2020-08-12 DIAGNOSIS — C3481 Malignant neoplasm of overlapping sites of right bronchus and lung: Secondary | ICD-10-CM | POA: Diagnosis not present

## 2020-08-12 DIAGNOSIS — R918 Other nonspecific abnormal finding of lung field: Secondary | ICD-10-CM | POA: Diagnosis not present

## 2020-08-12 DIAGNOSIS — Z20822 Contact with and (suspected) exposure to covid-19: Secondary | ICD-10-CM | POA: Diagnosis not present

## 2020-08-12 DIAGNOSIS — I10 Essential (primary) hypertension: Secondary | ICD-10-CM | POA: Diagnosis not present

## 2020-08-13 DIAGNOSIS — E1122 Type 2 diabetes mellitus with diabetic chronic kidney disease: Secondary | ICD-10-CM | POA: Diagnosis not present

## 2020-08-13 DIAGNOSIS — J9602 Acute respiratory failure with hypercapnia: Secondary | ICD-10-CM | POA: Diagnosis not present

## 2020-08-13 DIAGNOSIS — J441 Chronic obstructive pulmonary disease with (acute) exacerbation: Secondary | ICD-10-CM | POA: Diagnosis not present

## 2020-08-13 DIAGNOSIS — J9601 Acute respiratory failure with hypoxia: Secondary | ICD-10-CM | POA: Diagnosis not present

## 2020-08-13 DIAGNOSIS — R918 Other nonspecific abnormal finding of lung field: Secondary | ICD-10-CM | POA: Diagnosis not present

## 2020-08-13 DIAGNOSIS — I272 Pulmonary hypertension, unspecified: Secondary | ICD-10-CM | POA: Diagnosis not present

## 2020-08-13 DIAGNOSIS — N189 Chronic kidney disease, unspecified: Secondary | ICD-10-CM | POA: Diagnosis not present

## 2020-08-14 DIAGNOSIS — I5189 Other ill-defined heart diseases: Secondary | ICD-10-CM | POA: Diagnosis not present

## 2020-08-14 DIAGNOSIS — J9602 Acute respiratory failure with hypercapnia: Secondary | ICD-10-CM | POA: Diagnosis not present

## 2020-08-14 DIAGNOSIS — J9601 Acute respiratory failure with hypoxia: Secondary | ICD-10-CM | POA: Diagnosis not present

## 2020-08-14 DIAGNOSIS — J449 Chronic obstructive pulmonary disease, unspecified: Secondary | ICD-10-CM | POA: Diagnosis not present

## 2020-08-15 DIAGNOSIS — J441 Chronic obstructive pulmonary disease with (acute) exacerbation: Secondary | ICD-10-CM | POA: Diagnosis not present

## 2020-08-18 DIAGNOSIS — I340 Nonrheumatic mitral (valve) insufficiency: Secondary | ICD-10-CM | POA: Diagnosis not present

## 2020-08-18 DIAGNOSIS — Z9981 Dependence on supplemental oxygen: Secondary | ICD-10-CM | POA: Diagnosis not present

## 2020-08-18 DIAGNOSIS — C787 Secondary malignant neoplasm of liver and intrahepatic bile duct: Secondary | ICD-10-CM | POA: Diagnosis not present

## 2020-08-18 DIAGNOSIS — Z7189 Other specified counseling: Secondary | ICD-10-CM | POA: Diagnosis not present

## 2020-08-18 DIAGNOSIS — Z711 Person with feared health complaint in whom no diagnosis is made: Secondary | ICD-10-CM | POA: Diagnosis not present

## 2020-08-18 DIAGNOSIS — I38 Endocarditis, valve unspecified: Secondary | ICD-10-CM | POA: Diagnosis not present

## 2020-08-18 DIAGNOSIS — R011 Cardiac murmur, unspecified: Secondary | ICD-10-CM | POA: Diagnosis not present

## 2020-08-18 DIAGNOSIS — J9611 Chronic respiratory failure with hypoxia: Secondary | ICD-10-CM | POA: Diagnosis not present

## 2020-08-18 DIAGNOSIS — C3480 Malignant neoplasm of overlapping sites of unspecified bronchus and lung: Secondary | ICD-10-CM | POA: Diagnosis not present

## 2020-09-02 NOTE — Progress Notes (Deleted)
Subjective: No diagnosis found.   Blake Wise returns today in f/u with his urodynamic study.  He had a 674ml bladder with normal sensation and a well sustained bladder contraction but was unable to void and the foley was replaced.  He is for cystoscopy today.    GU Hx: Blake Wise is a 74 yo AAM who is sent by Dr. Stoney Bang for urinary retention found on a CT on 10/19/19 while he was at [facility] for [facility] but I don't see that a foley was placed then.   He was admitted to [facility] and had another CT on  12/02/19 that showed persistent retention and a foley was placed and has not been changed since.  The bladder wall was somewhat thickened and there was a possible diverticulum on the dome.  His prostate is small.  He is on warfarin and has had some hematuria with the foley but not before.   He had frequency with lasix before his initial admission.  He would have urgency with UUI but no pain prior to his admission at [facility].  He has no other GU history but is a diabetic with neuropathy.     I have reviewed his [facility] and [facility] from [facility].    His Cr was 1.45 on 10/27/19.  Hbg was 8.8.  His last INR from [facility] was about 15.  UA on 1/3 was clear.  ROS:  ROS  No Known Allergies  Past Medical History:  Diagnosis Date  . A-fib ([place])   . Anxiety   . Atrial fibrillation ([place])   . CHF (congestive heart failure) ([place])   . Chronic renal insufficiency   . COPD (chronic obstructive pulmonary disease) ([place])   . Diabetes ([place])   . Diabetes mellitus without complication ([place])   . Hypertension     Past Surgical History:  Procedure Laterality Date  . COLONOSCOPY WITH ESOPHAGOGASTRODUODENOSCOPY (EGD)     several years ago by Dr. Ladona Horns (2015??)    Social History   Socioeconomic History  . Marital status: Widowed    Spouse name: Not on file  . Number of children: 4  . Years of education: Not on file  . Highest education level: Not on file  Occupational History  . Occupation: retired  Tobacco Use   . Smoking status: Former Smoker    Packs/day: 1.00    Years: 49.00    Pack years: 49.00    Quit date: 2016    Years since quitting: 5.8  . Smokeless tobacco: Never Used  Substance and Sexual Activity  . Alcohol use: Not Currently  . Drug use: Never  . Sexual activity: Not on file  Other Topics Concern  . Not on file  Social History Narrative  . Not on file   Social Determinants of Health   Financial Resource Strain:   . Difficulty of Paying Living Expenses: Not on file  Food Insecurity:   . Worried About Charity fundraiser in the Last Year: Not on file  . Ran Out of Food in the Last Year: Not on file  Transportation Needs:   . Lack of Transportation (Medical): Not on file  . Lack of Transportation (Non-Medical): Not on file  Physical Activity:   . Days of Exercise per Week: Not on file  . Minutes of Exercise per Session: Not on file  Stress:   . Feeling of Stress : Not on file  Social Connections:   . Frequency of Communication with Friends  and Family: Not on file  . Frequency of Social Gatherings with Friends and Family: Not on file  . Attends Religious Services: Not on file  . Active Member of Clubs or Organizations: Not on file  . Attends Archivist Meetings: Not on file  . Marital Status: Not on file  Intimate Partner Violence:   . Fear of Current or Ex-Partner: Not on file  . Emotionally Abused: Not on file  . Physically Abused: Not on file  . Sexually Abused: Not on file    Family History  Problem Relation Age of Onset  . Colon cancer Neg Hx   . Colon polyps Neg Hx     Anti-infectives: Anti-infectives (From admission, onward)   None      Current Outpatient Medications  Medication Sig Dispense Refill  . ACCU-CHEK AVIVA PLUS test strip     . albuterol (VENTOLIN HFA) 108 (90 Base) MCG/ACT inhaler Inhale into the lungs. (Patient not taking: Reported on 04/16/2020)    . atorvastatin (LIPITOR) 10 MG tablet Take 10 mg by mouth daily.    .  Cholecalciferol 25 MCG (1000 UT) tablet Take by mouth. (Patient not taking: Reported on 04/16/2020)    . COLCRYS 0.6 MG tablet Take 0.6 mg by mouth 2 (two) times daily. (Patient not taking: Reported on 04/16/2020)    . diltiazem (CARDIZEM) 60 MG tablet Take 1 tablet (60 mg total) by mouth 2 (two) times daily. (Patient not taking: Reported on 04/16/2020) 60 tablet 0  . fluticasone (FLONASE) 50 MCG/ACT nasal spray  (Patient not taking: Reported on 04/16/2020)    . furosemide (LASIX) 20 MG tablet Take 20 mg by mouth daily. (Patient not taking: Reported on 04/16/2020)    . glimepiride (AMARYL) 1 MG tablet Take 1 mg by mouth daily.    Marland Kitchen guaiFENesin (MUCINEX) 600 MG 12 hr tablet Take 2 tablets (1,200 mg total) by mouth 2 (two) times daily. 30 tablet 0  . hydrALAZINE (APRESOLINE) 10 MG tablet  (Patient not taking: Reported on 04/16/2020)    . ipratropium-albuterol (DUONEB) 0.5-2.5 (3) MG/3ML SOLN Inhale 3 mLs into the lungs every 6 (six) hours as needed (breathing).     . isosorbide mononitrate (IMDUR) 30 MG 24 hr tablet Take 30 mg by mouth daily.    Marland Kitchen levalbuterol (XOPENEX) 0.63 MG/3ML nebulizer solution Take 0.63 mg by nebulization every 4 (four) hours as needed for wheezing or shortness of breath.    Marland Kitchen LORazepam (ATIVAN) 0.5 MG tablet Take 0.5 mg by mouth every 8 (eight) hours.    . methocarbamol (ROBAXIN) 500 MG tablet Take 500 mg by mouth 4 (four) times daily. (Patient not taking: Reported on 04/16/2020)    . metoprolol tartrate (LOPRESSOR) 25 MG tablet     . midodrine (PROAMATINE) 5 MG tablet  (Patient not taking: Reported on 04/16/2020)    . mirtazapine (REMERON) 15 MG tablet Take 15 mg by mouth at bedtime.     . Multiple Vitamins-Minerals (MENS ONE DAILY PO) Take 1 tablet by mouth daily.    . Omega-3 Fatty Acids (FISH OIL) 1000 MG CAPS Take 1 capsule by mouth daily.  (Patient not taking: Reported on 04/16/2020)    . pantoprazole (PROTONIX) 40 MG tablet Take 1 tablet (40 mg total) by mouth daily. (Patient not  taking: Reported on 04/16/2020) 30 tablet 0  . predniSONE (DELTASONE) 20 MG tablet Take 40mg  po daily for 2 days then 30mg  daily for 2 days then 20mg  daily for 2 days then 10mg   daily for 2 days then stop (Patient not taking: Reported on 04/16/2020) 20 tablet 0  . sitaGLIPtin (JANUVIA) 50 MG tablet Take 50 mg by mouth daily. (Patient not taking: Reported on 04/16/2020)    . sodium bicarbonate 650 MG tablet Take 650 mg by mouth 4 (four) times daily.    Marland Kitchen SPIRIVA HANDIHALER 18 MCG inhalation capsule 1 capsule daily. (Patient not taking: Reported on 04/16/2020)    . SYMBICORT 160-4.5 MCG/ACT inhaler Inhale 1 puff into the lungs daily.  (Patient not taking: Reported on 04/16/2020)    . tamsulosin (FLOMAX) 0.4 MG CAPS capsule Take 1 capsule (0.4 mg total) by mouth daily. 30 capsule 11  . warfarin (COUMADIN) 5 MG tablet      No current facility-administered medications for this visit.     Objective: There were no vitals taken for this visit.  Intake/Output from previous day: No intake/output data recorded. Intake/Output this shift: @IOTHISSHIFT @   Physical Exam  Lab Results:  No results found for this or any previous visit (from the past 24 hour(s)).  BMET No results for input(s): NA, K, CL, CO2, GLUCOSE, BUN, CREATININE, CALCIUM in the last 72 hours. PT/INR No results for input(s): LABPROT, INR in the last 72 hours. ABG No results for input(s): PHART, HCO3 in the last 72 hours.  Invalid input(s): PCO2, PO2  Studies/Results: URODYNAMICS STUDY  Test Indication: Retention The procedure's risks, benefits and infection risk were discussed with the patient.  PRE UROFLOW & CATHETERIZATION Procedure: Pre Uroflow Study The patient did not void. Arrived with a 16 fr foley. His catheter was removed, and a Urodynamic catheter was inserted.   CYSTOMETRY/CYSTOMETROGRAM The bladder was filled with room temperature water at a rate of less than 50 cc per minute. Injection of contrast was performed for  the cystometrogram. Max capacity was approx. 600 mls. First sensation occurred at 158 mls. Normal desire occurred at 168 mls. Stronger desire occurred at 269 mls.  The bladder was stable.   LEAK POINT PRESSURE LPPs were assessed with pt in a seated position. X-ray was used to assess for SUI. No leakage was noted with abdominal pressures of 81-100 cmH20.  PRESSURE FLOW STUDY He was able to generate a voluntary contraction. His contraction was well sustained, but he was unable to void. Max detrusor pressure was 47 cmH20.  ELECTROMYOGRAM Activity was measured by surface electrodes There was no voiding phase.  FLUOROSCOPY and VCUG Mild trrabeculation was noted. No reflux was seen. Pt confirmed no Neulasta OnPro Device.   POST PROCEDURE ANTIBIOTICS: No antibiotics were given post UDS. Urine was sent for culture. He was advised to watch for s/s of a break through UTI and to call the Blake Wise office if he develops any of them.   NURSE IMPRESSION Blake Wise held a max capacity of approx. 600 mls. His 1st sensation was felt at 158 mls. No instability was noted. He was able to generate a well sustained voluntary contraction but was unable to void. Max detrusor pressure was 47 cmH20. Mild trabeculation was noted. No reflux was seen. A 16 fr foley was inserted post UDS and 600 mls was obtained. He will return to [facility] for UDS f/u.  Cystoscopy:   His foley was removed and he was prepped with betadine and the urethra was instilled with lidocaine jelly.  Cipro 500mg  po was given.  The flexible scope was passed.  The urethra was normal.  The external sphincter was intake.  The prostate was short without lateral  lobe enlargement and the bladder neck was not too tight.  The bladder was difficult to visualize secondary to purulent debris.  There was moderate trabeculation and mucosal erythema without papillary tumors or stones.  The UO's were poorly visualized.   He attempted to void after the  cystoscopy but was unable to do much so a 72fr foley was reinserted and a urine was collected for culture prior to connecting the drainage tube.  There were no complications.    Assessment/Plan: Urinary retention that is chronic and is possibly secondary to diabetic cystopathy but he does have a decent detrusor contraction on UDS.   His prostate is small with minimal obstruction on cystoscopy.   He was unable to void effectively today.  I am going to start him on tamsulosin and reviewed the side effects.  He will return for a voiding trial in 2 weeks and if successful, he will need a PVR in 2-3 days and an OV with me in another month.   His foley was changed today and a culture was obtained.    No orders of the defined types were placed in this encounter.    No orders of the defined types were placed in this encounter.    No follow-ups on file.    CC: Dr. Herbie Drape.      Irine Seal 09/02/2020 [phone]

## 2020-09-03 ENCOUNTER — Ambulatory Visit: Payer: Medicare HMO | Admitting: Urology

## 2020-09-03 DIAGNOSIS — R339 Retention of urine, unspecified: Secondary | ICD-10-CM

## 2020-09-24 DIAGNOSIS — J449 Chronic obstructive pulmonary disease, unspecified: Secondary | ICD-10-CM | POA: Diagnosis not present

## 2020-09-24 DIAGNOSIS — J811 Chronic pulmonary edema: Secondary | ICD-10-CM | POA: Diagnosis not present

## 2020-09-24 DIAGNOSIS — J90 Pleural effusion, not elsewhere classified: Secondary | ICD-10-CM | POA: Diagnosis not present

## 2020-09-24 DIAGNOSIS — R0902 Hypoxemia: Secondary | ICD-10-CM | POA: Diagnosis not present

## 2020-09-24 DIAGNOSIS — I5031 Acute diastolic (congestive) heart failure: Secondary | ICD-10-CM | POA: Diagnosis not present

## 2020-09-24 DIAGNOSIS — D62 Acute posthemorrhagic anemia: Secondary | ICD-10-CM | POA: Diagnosis not present

## 2020-09-24 DIAGNOSIS — R918 Other nonspecific abnormal finding of lung field: Secondary | ICD-10-CM | POA: Diagnosis not present

## 2020-09-24 DIAGNOSIS — I051 Rheumatic mitral insufficiency: Secondary | ICD-10-CM | POA: Diagnosis not present

## 2020-09-24 DIAGNOSIS — Z8701 Personal history of pneumonia (recurrent): Secondary | ICD-10-CM | POA: Diagnosis not present

## 2020-09-24 DIAGNOSIS — C3480 Malignant neoplasm of overlapping sites of unspecified bronchus and lung: Secondary | ICD-10-CM | POA: Diagnosis not present

## 2020-09-24 DIAGNOSIS — R778 Other specified abnormalities of plasma proteins: Secondary | ICD-10-CM | POA: Diagnosis not present

## 2020-09-24 DIAGNOSIS — R069 Unspecified abnormalities of breathing: Secondary | ICD-10-CM | POA: Diagnosis not present

## 2020-09-24 DIAGNOSIS — E876 Hypokalemia: Secondary | ICD-10-CM | POA: Diagnosis not present

## 2020-09-24 DIAGNOSIS — I10 Essential (primary) hypertension: Secondary | ICD-10-CM | POA: Diagnosis not present

## 2020-09-24 DIAGNOSIS — I480 Paroxysmal atrial fibrillation: Secondary | ICD-10-CM | POA: Diagnosis not present

## 2020-09-24 DIAGNOSIS — R000 Tachycardia, unspecified: Secondary | ICD-10-CM | POA: Diagnosis not present

## 2020-09-24 DIAGNOSIS — R64 Cachexia: Secondary | ICD-10-CM | POA: Diagnosis not present

## 2020-09-24 DIAGNOSIS — R0600 Dyspnea, unspecified: Secondary | ICD-10-CM | POA: Diagnosis not present

## 2020-09-24 DIAGNOSIS — N179 Acute kidney failure, unspecified: Secondary | ICD-10-CM | POA: Diagnosis not present

## 2020-09-24 DIAGNOSIS — Z9981 Dependence on supplemental oxygen: Secondary | ICD-10-CM | POA: Diagnosis not present

## 2020-09-24 DIAGNOSIS — J189 Pneumonia, unspecified organism: Secondary | ICD-10-CM | POA: Diagnosis not present

## 2020-09-24 DIAGNOSIS — R7989 Other specified abnormal findings of blood chemistry: Secondary | ICD-10-CM | POA: Diagnosis not present

## 2020-09-24 DIAGNOSIS — C787 Secondary malignant neoplasm of liver and intrahepatic bile duct: Secondary | ICD-10-CM | POA: Diagnosis not present

## 2020-09-24 DIAGNOSIS — I509 Heart failure, unspecified: Secondary | ICD-10-CM | POA: Diagnosis not present

## 2020-09-24 DIAGNOSIS — I130 Hypertensive heart and chronic kidney disease with heart failure and stage 1 through stage 4 chronic kidney disease, or unspecified chronic kidney disease: Secondary | ICD-10-CM | POA: Diagnosis not present

## 2020-09-24 DIAGNOSIS — Z87891 Personal history of nicotine dependence: Secondary | ICD-10-CM | POA: Diagnosis not present

## 2020-09-24 DIAGNOSIS — R0602 Shortness of breath: Secondary | ICD-10-CM | POA: Diagnosis not present

## 2020-09-24 DIAGNOSIS — J9611 Chronic respiratory failure with hypoxia: Secondary | ICD-10-CM | POA: Diagnosis not present

## 2020-09-24 DIAGNOSIS — Z20822 Contact with and (suspected) exposure to covid-19: Secondary | ICD-10-CM | POA: Diagnosis not present

## 2020-09-24 DIAGNOSIS — I4891 Unspecified atrial fibrillation: Secondary | ICD-10-CM | POA: Diagnosis not present

## 2020-09-24 DIAGNOSIS — E43 Unspecified severe protein-calorie malnutrition: Secondary | ICD-10-CM | POA: Diagnosis not present

## 2020-09-24 DIAGNOSIS — M7989 Other specified soft tissue disorders: Secondary | ICD-10-CM | POA: Diagnosis not present

## 2020-09-24 DIAGNOSIS — J180 Bronchopneumonia, unspecified organism: Secondary | ICD-10-CM | POA: Diagnosis not present

## 2020-09-24 DIAGNOSIS — J159 Unspecified bacterial pneumonia: Secondary | ICD-10-CM | POA: Diagnosis not present

## 2020-09-24 DIAGNOSIS — D649 Anemia, unspecified: Secondary | ICD-10-CM | POA: Diagnosis not present

## 2020-09-24 DIAGNOSIS — I5023 Acute on chronic systolic (congestive) heart failure: Secondary | ICD-10-CM | POA: Diagnosis not present

## 2020-09-24 DIAGNOSIS — I21A1 Myocardial infarction type 2: Secondary | ICD-10-CM | POA: Diagnosis not present

## 2020-09-24 DIAGNOSIS — I5033 Acute on chronic diastolic (congestive) heart failure: Secondary | ICD-10-CM | POA: Diagnosis not present

## 2020-09-29 DIAGNOSIS — I10 Essential (primary) hypertension: Secondary | ICD-10-CM | POA: Diagnosis not present

## 2020-09-29 DIAGNOSIS — C3480 Malignant neoplasm of overlapping sites of unspecified bronchus and lung: Secondary | ICD-10-CM | POA: Diagnosis not present

## 2020-09-29 DIAGNOSIS — I272 Pulmonary hypertension, unspecified: Secondary | ICD-10-CM | POA: Diagnosis not present

## 2020-09-29 DIAGNOSIS — N183 Chronic kidney disease, stage 3 unspecified: Secondary | ICD-10-CM | POA: Diagnosis not present

## 2020-09-29 DIAGNOSIS — Z006 Encounter for examination for normal comparison and control in clinical research program: Secondary | ICD-10-CM | POA: Diagnosis not present

## 2020-09-30 DIAGNOSIS — R29898 Other symptoms and signs involving the musculoskeletal system: Secondary | ICD-10-CM | POA: Diagnosis not present

## 2020-09-30 DIAGNOSIS — Z7401 Bed confinement status: Secondary | ICD-10-CM | POA: Diagnosis not present

## 2020-09-30 DIAGNOSIS — R404 Transient alteration of awareness: Secondary | ICD-10-CM | POA: Diagnosis not present

## 2020-09-30 DIAGNOSIS — C3480 Malignant neoplasm of overlapping sites of unspecified bronchus and lung: Secondary | ICD-10-CM | POA: Diagnosis not present

## 2020-09-30 DIAGNOSIS — R279 Unspecified lack of coordination: Secondary | ICD-10-CM | POA: Diagnosis not present

## 2020-09-30 DIAGNOSIS — Z006 Encounter for examination for normal comparison and control in clinical research program: Secondary | ICD-10-CM | POA: Diagnosis not present

## 2020-10-16 DEATH — deceased
